# Patient Record
Sex: Female | Born: 1990
Health system: Southern US, Community
[De-identification: ages and names within clinical notes are randomized; demographics above are authoritative.]

## PROBLEM LIST (undated history)

## (undated) DIAGNOSIS — Z820 Family history of epilepsy and other diseases of the nervous system: Secondary | ICD-10-CM

## (undated) DIAGNOSIS — N289 Disorder of kidney and ureter, unspecified: Secondary | ICD-10-CM

## (undated) DIAGNOSIS — R000 Tachycardia, unspecified: Secondary | ICD-10-CM

## (undated) DIAGNOSIS — F419 Anxiety disorder, unspecified: Secondary | ICD-10-CM

## (undated) DIAGNOSIS — G43909 Migraine, unspecified, not intractable, without status migrainosus: Secondary | ICD-10-CM

## (undated) DIAGNOSIS — F32A Depression, unspecified: Secondary | ICD-10-CM

## (undated) DIAGNOSIS — B009 Herpesviral infection, unspecified: Secondary | ICD-10-CM

## (undated) HISTORY — DX: Migraine, unspecified, not intractable, without status migrainosus: G43.909

## (undated) HISTORY — DX: Family history of epilepsy and other diseases of the nervous system: Z82.0

## (undated) HISTORY — PX: TONSILLECTOMY: SUR1361

## (undated) HISTORY — DX: Anxiety disorder, unspecified: F41.9

## (undated) HISTORY — DX: Herpesviral infection, unspecified: B00.9

## (undated) HISTORY — DX: Depression, unspecified: F32.A

---
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
## 2012-10-03 ENCOUNTER — Emergency Department: Payer: Self-pay | Admitting: Emergency Medicine

## 2012-10-03 LAB — PREGNANCY, URINE: Pregnancy Test, Urine: NEGATIVE m[IU]/mL

## 2012-10-03 LAB — COMPREHENSIVE METABOLIC PANEL
Albumin: 4.3 g/dL (ref 3.8–5.6)
Alkaline Phosphatase: 76 U/L — ABNORMAL LOW (ref 82–169)
BUN: 15 mg/dL (ref 7–18)
Calcium, Total: 9.3 mg/dL (ref 9.0–10.7)
EGFR (African American): >60
EGFR (Non-African Amer.): >60
Glucose: 84 mg/dL (ref 65–99)
Potassium: 3.9 mmol/L (ref 3.5–5.1)
SGOT(AST): 13 U/L (ref 0–26)
Sodium: 138 mmol/L (ref 136–145)
Total Protein: 7.8 g/dL (ref 6.4–8.6)

## 2012-10-03 LAB — URINALYSIS, COMPLETE
Bacteria: NONE SEEN
Glucose,UR: NEGATIVE mg/dL (ref 0–75)
Ketone: NEGATIVE
Nitrite: NEGATIVE
Protein: NEGATIVE
RBC,UR: 1 /HPF (ref 0–5)
Specific Gravity: 1.016 (ref 1.003–1.030)
Squamous Epithelial: 1

## 2012-10-03 LAB — CBC
HCT: 43 % (ref 35.0–47.0)
MCH: 31.7 pg (ref 26.0–34.0)
MCV: 92 fL (ref 80–100)

## 2013-07-31 ENCOUNTER — Emergency Department: Payer: Self-pay | Admitting: Emergency Medicine

## 2013-07-31 LAB — COMPREHENSIVE METABOLIC PANEL
ALT: 12 U/L (ref 12–78)
Albumin: 4.3 g/dL (ref 3.4–5.0)
Alkaline Phosphatase: 71 U/L
Anion Gap: 5 — ABNORMAL LOW (ref 7–16)
BUN: 10 mg/dL (ref 7–18)
Bilirubin,Total: 0.5 mg/dL (ref 0.2–1.0)
CHLORIDE: 106 mmol/L (ref 98–107)
CREATININE: 0.69 mg/dL (ref 0.60–1.30)
Calcium, Total: 9.1 mg/dL (ref 8.5–10.1)
Co2: 27 mmol/L (ref 21–32)
EGFR (African American): >60
Glucose: 102 mg/dL — ABNORMAL HIGH (ref 65–99)
Osmolality: 275 (ref 275–301)
POTASSIUM: 3.7 mmol/L (ref 3.5–5.1)
SGOT(AST): 13 U/L — ABNORMAL LOW (ref 15–37)
Sodium: 138 mmol/L (ref 136–145)
TOTAL PROTEIN: 8.1 g/dL (ref 6.4–8.2)

## 2013-07-31 LAB — URINALYSIS, COMPLETE
BLOOD: NEGATIVE
Bacteria: NONE SEEN
Bilirubin,UR: NEGATIVE
Glucose,UR: NEGATIVE mg/dL (ref 0–75)
Ketone: NEGATIVE
LEUKOCYTE ESTERASE: NEGATIVE
Nitrite: NEGATIVE
PH: 5 (ref 4.5–8.0)
PROTEIN: NEGATIVE
Specific Gravity: 1.021 (ref 1.003–1.030)
Squamous Epithelial: 4
WBC UR: 1 /HPF (ref 0–5)

## 2013-07-31 LAB — CBC WITH DIFFERENTIAL/PLATELET
Basophil #: 0.1 10*3/uL (ref 0.0–0.1)
Basophil %: 0.9 %
EOS PCT: 2.1 %
Eosinophil #: 0.1 10*3/uL (ref 0.0–0.7)
HCT: 45.1 % (ref 35.0–47.0)
HGB: 14.8 g/dL (ref 12.0–16.0)
LYMPHS ABS: 1.6 10*3/uL (ref 1.0–3.6)
Lymphocyte %: 24.3 %
MCH: 31.3 pg (ref 26.0–34.0)
MCHC: 32.8 g/dL (ref 32.0–36.0)
MCV: 95 fL (ref 80–100)
Monocyte #: 0.4 x10 3/mm (ref 0.2–0.9)
Monocyte %: 6.9 %
Neutrophil #: 4.2 10*3/uL (ref 1.4–6.5)
Neutrophil %: 65.8 %
Platelet: 201 10*3/uL (ref 150–440)
RBC: 4.74 10*6/uL (ref 3.80–5.20)
RDW: 12.9 % (ref 11.5–14.5)
WBC: 6.4 10*3/uL (ref 3.6–11.0)

## 2017-09-25 ENCOUNTER — Encounter: Payer: Self-pay | Admitting: Internal Medicine

## 2017-09-25 ENCOUNTER — Ambulatory Visit: Payer: Managed Care, Other (non HMO) | Admitting: Internal Medicine

## 2017-09-25 VITALS — BP 98/62 | HR 88 | Temp 98.2°F | Ht 62.0 in | Wt 119.2 lb

## 2017-09-25 DIAGNOSIS — Z1329 Encounter for screening for other suspected endocrine disorder: Secondary | ICD-10-CM | POA: Diagnosis not present

## 2017-09-25 DIAGNOSIS — Z0184 Encounter for antibody response examination: Secondary | ICD-10-CM | POA: Diagnosis not present

## 2017-09-25 DIAGNOSIS — Z1159 Encounter for screening for other viral diseases: Secondary | ICD-10-CM

## 2017-09-25 DIAGNOSIS — G43909 Migraine, unspecified, not intractable, without status migrainosus: Secondary | ICD-10-CM | POA: Diagnosis not present

## 2017-09-25 DIAGNOSIS — Z13220 Encounter for screening for lipoid disorders: Secondary | ICD-10-CM | POA: Diagnosis not present

## 2017-09-25 DIAGNOSIS — Z0000 Encounter for general adult medical examination without abnormal findings: Secondary | ICD-10-CM

## 2017-09-25 DIAGNOSIS — Z8041 Family history of malignant neoplasm of ovary: Secondary | ICD-10-CM | POA: Diagnosis not present

## 2017-09-25 DIAGNOSIS — Z124 Encounter for screening for malignant neoplasm of cervix: Secondary | ICD-10-CM

## 2017-09-25 DIAGNOSIS — Z803 Family history of malignant neoplasm of breast: Secondary | ICD-10-CM | POA: Insufficient documentation

## 2017-09-25 DIAGNOSIS — E559 Vitamin D deficiency, unspecified: Secondary | ICD-10-CM | POA: Diagnosis not present

## 2017-09-25 DIAGNOSIS — Z1389 Encounter for screening for other disorder: Secondary | ICD-10-CM

## 2017-09-25 MED ORDER — ONDANSETRON HCL 4 MG PO TABS: 4.0000 mg | ORAL_TABLET | Freq: Three times a day (TID) | ORAL | 0 refills | Status: DC | PRN

## 2017-09-25 MED ORDER — SUMATRIPTAN SUCCINATE 50 MG PO TABS: ORAL_TABLET | ORAL | 2 refills | Status: DC

## 2017-09-25 NOTE — Progress Notes (Signed)
Pre visit review using our clinic review tool, if applicable. No additional management support is needed unless otherwise documented below in the visit note. 

## 2017-09-25 NOTE — Progress Notes (Signed)
Chief Complaint  Patient presents with  . New Patient (Initial Visit)   New patient  1. Migraines 2 months ago had h/a and left eye vision abnormal went to urgent care and told had migraines. Migraines associated with n/v bothered by light/sound. She has then 1x per month not related to menses also mother has migraines. Tries excedrin otc w/o much relief and sleeping which helps  2. She reports she is sexually active not using protection of OCP long period and still not pregnant will refer to OB/GYN also FH breast and ovarian cancer and needs pap    Review of Systems  Constitutional: Negative for weight loss.  HENT: Negative for hearing loss.   Eyes: Negative for blurred vision.  Respiratory: Negative for shortness of breath.   Cardiovascular: Negative for chest pain.  Gastrointestinal: Negative for abdominal pain.  Musculoskeletal: Negative for falls.  Skin: Negative for rash.  Neurological: Positive for headaches.  Psychiatric/Behavioral: Negative for depression.   Past Medical History:  Diagnosis Date  . FH: migraines    mom  . Migraines    Past Surgical History:  Procedure Laterality Date  . TONSILLECTOMY     Family History  Problem Relation Age of Onset  . Arthritis Mother   . Cancer Mother        ovarian   . Depression Mother   . Cancer Maternal Grandmother        breast   . Depression Maternal Grandmother   . Cancer Maternal Grandfather        ? type ?bone  . Hearing loss Maternal Grandfather    Social History   Socioeconomic History  . Marital status: Single    Spouse name: Not on file  . Number of children: Not on file  . Years of education: Not on file  . Highest education level: Not on file  Occupational History  . Not on file  Social Needs  . Financial resource strain: Not on file  . Food insecurity:    Worry: Not on file    Inability: Not on file  . Transportation needs:    Medical: Not on file    Non-medical: Not on file  Tobacco Use  .  Smoking status: Light Tobacco Smoker  . Smokeless tobacco: Never Used  . Tobacco comment: social smoker   Substance and Sexual Activity  . Alcohol use: Yes  . Drug use: Not Currently  . Sexual activity: Yes    Partners: Male    Birth control/protection: None  Lifestyle  . Physical activity:    Days per week: Not on file    Minutes per session: Not on file  . Stress: Not on file  Relationships  . Social connections:    Talks on phone: Not on file    Gets together: Not on file    Attends religious service: Not on file    Active member of club or organization: Not on file    Attends meetings of clubs or organizations: Not on file    Relationship status: Not on file  . Intimate partner violence:    Fear of current or ex partner: Not on file    Emotionally abused: Not on file    Physically abused: Not on file    Forced sexual activity: Not on file  Other Topics Concern  . Not on file  Social History Narrative   [employer] [place]    HS ed    No kids  Wears seat belt, no guns, safe in relationship    No outpatient medications have been marked as taking for the 09/25/17 encounter (Office Visit) with McLean-Scocuzza, Nino Glow, MD.   No Known Allergies No results found for this or any previous visit (from the past 2160 hour(s)). Objective  Body mass index is 21.8 kg/m. Wt Readings from Last 3 Encounters:  09/25/17 119 lb 3.2 oz (54.1 kg)   Temp Readings from Last 3 Encounters:  09/25/17 98.2 F (36.8 C) (Oral)   BP Readings from Last 3 Encounters:  09/25/17 98/62   Pulse Readings from Last 3 Encounters:  09/25/17 88    Physical Exam  Constitutional: She is oriented to person, place, and time. Vital signs are normal. She appears well-developed and well-nourished. She is cooperative.  HENT:  Head: Normocephalic and atraumatic.  Mouth/Throat: Oropharynx is clear and moist and mucous membranes are normal.  Eyes: Pupils are equal, round,  and reactive to light. Conjunctivae are normal.  Cardiovascular: Normal rate, regular rhythm and normal heart sounds.  Pulmonary/Chest: Effort normal and breath sounds normal.  Neurological: She is alert and oriented to person, place, and time. Gait normal.  Skin: Skin is warm, dry and intact.  Benign nevi  Tanned skin   Psychiatric: She has a normal mood and affect. Her speech is normal and behavior is normal. Judgment and thought content normal. Cognition and memory are normal.  Nursing note and vitals reviewed.   Assessment   1. Migraines  2. FH ovarian, breast cancer, need for pap, ?fertility status  3. HM Plan  1. Trial of imitrex and zofran  Refer to [facility]  Consider neurology if not better in future  Sleeping 8 hours, rec reduce caffeine  2.  Refer [facility] Dr. Georgianne Fick do pap w/u fertility and disc genetic testing for FH  3.  Declines flu shot  Disc and rec Tdap pt to think given info today  Per pt hep B immune  sch fasting labs before next appt  Per pt just had std testing so declines  Pap refer OB/GYN LMP 08/26/17  rec smoking cessation light social smoker  rec avoid sun tanning  Provider: Dr. Olivia Mackie McLean-Scocuzza-Internal Medicine

## 2017-09-25 NOTE — Patient Instructions (Addendum)
Please take imitrex 50 with headache worse headache (you may take with Advile/Aleve) and then repeat another dose in 2 hours. No more than 200 mg per day no more than 2x per week  Think about Tdap vaccine   Tdap/DTaP Vaccine (Diphtheria, Tetanus, and Pertussis): What You Need to Know 1. Why get vaccinated? Diphtheria, tetanus, and pertussis are serious diseases caused by bacteria. Diphtheria and pertussis are spread from person to person. Tetanus enters the body through cuts or wounds. DIPHTHERIA causes a thick covering in the back of the throat.  It can lead to breathing problems, paralysis, heart failure, and even death.  TETANUS (Lockjaw) causes painful tightening of the muscles, usually all over the body.  It can lead to "locking" of the jaw so the victim cannot open his mouth or swallow. Tetanus leads to death in up to 2 out of 10 cases.  PERTUSSIS (Whooping Cough) causes coughing spells so bad that it is hard for infants to eat, drink, or breathe. These spells can last for weeks.  It can lead to pneumonia, seizures (jerking and staring spells), brain damage, and death.  Diphtheria, tetanus, and pertussis vaccine (DTaP) can help prevent these diseases. Most children who are vaccinated with DTaP will be protected throughout childhood. Many more children would get these diseases if we stopped vaccinating. DTaP is a safer version of an older vaccine called DTP. DTP is no longer used in the [place]. 2. Who should get DTaP vaccine and when? Children should get 5 doses of DTaP vaccine, one dose at each of the following ages:  2 months  4 months  6 months  15-18 months  4-6 years  DTaP may be given at the same time as other vaccines. 3. Some children should not get DTaP vaccine or should wait  Children with minor illnesses, such as a cold, may be vaccinated. But children who are moderately or severely ill should usually wait until they recover before getting DTaP  vaccine.  Any child who had a life-threatening allergic reaction after a dose of DTaP should not get another dose.  Any child who suffered a brain or nervous system disease within 7 days after a dose of DTaP should not get another dose.  Talk with your doctor if your child: ? had a seizure or collapsed after a dose of DTaP, ? cried non-stop for 3 hours or more after a dose of DTaP, ? had a fever over 105F after a dose of DTaP. Ask your doctor for more information. Some of these children should not get another dose of pertussis vaccine, but may get a vaccine without pertussis, called DT. 4. Older children and adults DTaP is not licensed for adolescents, adults, or children 26 years of age and older. But older people still need protection. A vaccine called Tdap is similar to DTaP. A single dose of Tdap is recommended for people 11 through 25 years of age. Another vaccine, called Td, protects against tetanus and diphtheria, but not pertussis. It is recommended every 10 years. There are separate Vaccine Information Statements for these vaccines. 5. What are the risks from DTaP vaccine? Getting diphtheria, tetanus, or pertussis disease is much riskier than getting DTaP vaccine. However, a vaccine, like any medicine, is capable of causing serious problems, such as severe allergic reactions. The risk of DTaP vaccine causing serious harm, or death, is extremely small. Mild problems (common)  Fever (up to about 1 child in 4)  Redness or swelling where the shot was  given (up to about 1 child in 4)  Soreness or tenderness where the shot was given (up to about 1 child in 4) These problems occur more often after the 4th and 5th doses of the DTaP series than after earlier doses. Sometimes the 4th or 5th dose of DTaP vaccine is followed by swelling of the entire arm or leg in which the shot was given, lasting 1-7 days (up to about 1 child in 34). Other mild problems include:  Fussiness (up to about 1  child in 3)  Tiredness or poor appetite (up to about 1 child in 10)  Vomiting (up to about 1 child in 57) These problems generally occur 1-3 days after the shot. Moderate problems (uncommon)  Seizure (jerking or staring) (about 1 child out of 14,000)  Non-stop crying, for 3 hours or more (up to about 1 child out of 1,000)  High fever, over 105F (about 1 child out of 16,000) Severe problems (very rare)  Serious allergic reaction (less than 1 out of a million doses)  Several other severe problems have been reported after DTaP vaccine. These include: ? Long-term seizures, coma, or lowered consciousness ? Permanent brain damage. These are so rare it is hard to tell if they are caused by the vaccine. Controlling fever is especially important for children who have had seizures, for any reason. It is also important if another family member has had seizures. You can reduce fever and pain by giving your child an aspirin-free pain reliever when the shot is given, and for the next 24 hours, following the package instructions. 6. What if there is a serious reaction? What should I look for? Look for anything that concerns you, such as signs of a severe allergic reaction, very high fever, or behavior changes. Signs of a severe allergic reaction can include hives, swelling of the face and throat, difficulty breathing, a fast heartbeat, dizziness, and weakness. These would start a few minutes to a few hours after the vaccination. What should I do?  If you think it is a severe allergic reaction or other emergency that can't wait, call 9-1-1 or get the person to the nearest hospital. Otherwise, call your doctor.  Afterward, the reaction should be reported to the Vaccine Adverse Event Reporting System (VAERS). Your doctor might file this report, or you can do it yourself through the VAERS web site at www.vaers.SamedayNews.es, or by calling 7400998014. ? VAERS is only for reporting reactions. They do not  give medical advice. 7. The National Vaccine Injury Compensation Program The [employer] Vaccine Injury Compensation Program (VICP) is a federal program that was created to compensate people who may have been injured by certain vaccines. Persons who believe they may have been injured by a vaccine can learn about the program and about filing a claim by calling (218) 266-2415 or visiting the [place] website at GoldCloset.com.ee. 8. How can I learn more?  Ask your doctor.  Call your local or state health department.  Contact the Centers for Disease Control and Prevention (CDC): ? Call (234)378-9690 (1-800-CDC-INFO) or ? Visit CDC's website at http://hunter.com/ CDC DTaP Vaccine (Diphtheria, Tetanus, and Pertussis) VIS (09/14/05) This information is not intended to replace advice given to you by your health care provider. Make sure you discuss any questions you have with your health care provider. Document Released: 02/12/2006 Document Revised: 01/06/2016 Document Reviewed: 01/06/2016 Elsevier Interactive Patient Education  2017 [place].   Imitrex/Sumatriptan tablets What is this medicine? SUMATRIPTAN (soo ma TRIP tan) is used  to treat migraines with or without aura. An aura is a strange feeling or visual disturbance that warns you of an attack. It is not used to prevent migraines. This medicine may be used for other purposes; ask your health care provider or pharmacist if you have questions. COMMON BRAND NAME(S): Imitrex, Migraine Pack What should I tell my health care provider before I take this medicine? They need to know if you have any of these conditions: -circulation problems in fingers and toes -diabetes -heart disease -high blood pressure -high cholesterol -history of irregular heartbeat -history of stroke -kidney disease -liver disease -postmenopausal or surgical removal of uterus and ovaries -seizures -smoke tobacco -stomach or intestine problems -an  unusual or allergic reaction to sumatriptan, other medicines, foods, dyes, or preservatives -pregnant or trying to get pregnant -breast-feeding How should I use this medicine? Take this medicine by mouth with a glass of water. Follow the directions on the prescription label. This medicine is taken at the first symptoms of a migraine. It is not for everyday use. If your migraine headache returns after one dose, you can take another dose as directed. You must leave at least 2 hours between doses, and do not take more than 100 mg as a single dose. Do not take more than 200 mg total in any 24 hour period. If there is no improvement at all after the first dose, do not take a second dose without talking to your doctor or health care professional. Do not take your medicine more often than directed. Talk to your pediatrician regarding the use of this medicine in children. Special care may be needed. Overdosage: If you think you have taken too much of this medicine contact a poison control center or emergency room at once. NOTE: This medicine is only for you. Do not share this medicine with others. What if I miss a dose? This does not apply; this medicine is not for regular use. What may interact with this medicine? Do not take this medicine with any of the following medicines: -cocaine -ergot alkaloids like dihydroergotamine, ergonovine, ergotamine, methylergonovine -feverfew -MAOIs like Carbex, Eldepryl, Marplan, Nardil, and Parnate -other medicines for migraine headache like almotriptan, eletriptan, frovatriptan, naratriptan, rizatriptan, zolmitriptan -tryptophan This medicine may also interact with the following medications: -certain medicines for depression, anxiety, or psychotic disturbances This list may not describe all possible interactions. Give your health care provider a list of all the medicines, herbs, non-prescription drugs, or dietary supplements you use. Also tell them if you smoke, drink  alcohol, or use illegal drugs. Some items may interact with your medicine. What should I watch for while using this medicine? Only take this medicine for a migraine headache. Take it if you get warning symptoms or at the start of a migraine attack. It is not for regular use to prevent migraine attacks. You may get drowsy or dizzy. Do not drive, use machinery, or do anything that needs mental alertness until you know how this medicine affects you. To reduce dizzy or fainting spells, do not sit or stand up quickly, especially if you are an older patient. Alcohol can increase drowsiness, dizziness and flushing. Avoid alcoholic drinks. Smoking cigarettes may increase the risk of heart-related side effects from using this medicine. If you take migraine medicines for 10 or more days a month, your migraines may get worse. Keep a diary of headache days and medicine use. Contact your healthcare professional if your migraine attacks occur more frequently. What side effects may I notice from  receiving this medicine? Side effects that you should report to your doctor or health care professional as soon as possible: -allergic reactions like skin rash, itching or hives, swelling of the face, lips, or tongue -bloody or watery diarrhea -hallucination, loss of contact with reality -pain, tingling, numbness in the face, hands, or feet -seizures -signs and symptoms of a blood clot such as breathing problems; changes in vision; chest pain; severe, sudden headache; pain, swelling, warmth in the leg; trouble speaking; sudden numbness or weakness of the face, arm, or leg -signs and symptoms of a dangerous change in heartbeat or heart rhythm like chest pain; dizziness; fast or irregular heartbeat; palpitations, feeling faint or lightheaded; falls; breathing problems -signs and symptoms of a stroke like changes in vision; confusion; trouble speaking or understanding; severe headaches; sudden numbness or weakness of the face,  arm, or leg; trouble walking; dizziness; loss of balance or coordination -stomach pain Side effects that usually do not require medical attention (report to your doctor or health care professional if they continue or are bothersome): -changes in taste -facial flushing -headache -muscle cramps -muscle pain -nausea, vomiting -weak or tired This list may not describe all possible side effects. Call your doctor for medical advice about side effects. You may report side effects to FDA at 1-800-FDA-1088. Where should I keep my medicine? Keep out of the reach of children. Store at room temperature between 2 and 30 degrees C (36 and 86 degrees F). Throw away any unused medicine after the expiration date. NOTE: This sheet is a summary. It may not cover all possible information. If you have questions about this medicine, talk to your doctor, pharmacist, or health care provider.  2018 Elsevier/Gold Standard (2015-05-20 12:38:23)  Migraine Headache A migraine headache is an intense, throbbing pain on one side or both sides of the head. Migraines may also cause other symptoms, such as nausea, vomiting, and sensitivity to light and noise. What are the causes? Doing or taking certain things may also trigger migraines, such as:  Alcohol.  Smoking.  Medicines, such as: ? Medicine used to treat chest pain (nitroglycerine). ? Birth control pills. ? Estrogen pills. ? Certain blood pressure medicines.  Aged cheeses, chocolate, or caffeine.  Foods or drinks that contain nitrates, glutamate, aspartame, or tyramine.  Physical activity.  Other things that may trigger a migraine include:  Menstruation.  Pregnancy.  Hunger.  Stress, lack of sleep, too much sleep, or fatigue.  Weather changes.  What increases the risk? The following factors may make you more likely to experience migraine headaches:  Age. Risk increases with age.  Family history of migraine headaches.  Being  Caucasian.  Depression and anxiety.  Obesity.  Being a woman.  Having a hole in the heart (patent foramen ovale) or other heart problems.  What are the signs or symptoms? The main symptom of this condition is pulsating or throbbing pain. Pain may:  Happen in any area of the head, such as on one side or both sides.  Interfere with daily activities.  Get worse with physical activity.  Get worse with exposure to bright lights or loud noises.  Other symptoms may include:  Nausea.  Vomiting.  Dizziness.  General sensitivity to bright lights, loud noises, or smells.  Before you get a migraine, you may get warning signs that a migraine is developing (aura). An aura may include:  Seeing flashing lights or having blind spots.  Seeing bright spots, halos, or zigzag lines.  Having tunnel vision or  blurred vision.  Having numbness or a tingling feeling.  Having trouble talking.  Having muscle weakness.  How is this diagnosed? A migraine headache can be diagnosed based on:  Your symptoms.  A physical exam.  Tests, such as CT scan or MRI of the head. These imaging tests can help rule out other causes of headaches.  Taking fluid from the spine (lumbar puncture) and analyzing it (cerebrospinal fluid analysis, or CSF analysis).  How is this treated? A migraine headache is usually treated with medicines that:  Relieve pain.  Relieve nausea.  Prevent migraines from coming back.  Treatment may also include:  Acupuncture.  Lifestyle changes like avoiding foods that trigger migraines.  Follow these instructions at home: Medicines  Take over-the-counter and prescription medicines only as told by your health care provider.  Do not drive or use heavy machinery while taking prescription pain medicine.  To prevent or treat constipation while you are taking prescription pain medicine, your health care provider may recommend that you: ? Drink enough fluid to keep  your urine clear or pale yellow. ? Take over-the-counter or prescription medicines. ? Eat foods that are high in fiber, such as fresh fruits and vegetables, whole grains, and beans. ? Limit foods that are high in fat and processed sugars, such as fried and sweet foods. Lifestyle  Avoid alcohol use.  Do not use any products that contain nicotine or tobacco, such as cigarettes and e-cigarettes. If you need help quitting, ask your health care provider.  Get at least 8 hours of sleep every night.  Limit your stress. General instructions   Keep a journal to find out what may trigger your migraine headaches. For example, write down: ? What you eat and drink. ? How much sleep you get. ? Any change to your diet or medicines.  If you have a migraine: ? Avoid things that make your symptoms worse, such as bright lights. ? It may help to lie down in a dark, quiet room. ? Do not drive or use heavy machinery. ? Ask your health care provider what activities are safe for you while you are experiencing symptoms.  Keep all follow-up visits as told by your health care provider. This is important. Contact a health care provider if:  You develop symptoms that are different or more severe than your usual migraine symptoms. Get help right away if:  Your migraine becomes severe.  You have a fever.  You have a stiff neck.  You have vision loss.  Your muscles feel weak or like you cannot control them.  You start to lose your balance often.  You develop trouble walking.  You faint. This information is not intended to replace advice given to you by your health care provider. Make sure you discuss any questions you have with your health care provider. Document Released: 04/17/2005 Document Revised: 11/05/2015 Document Reviewed: 10/04/2015 Elsevier Interactive Patient Education  2017 [employer].

## 2017-10-01 ENCOUNTER — Encounter: Payer: Self-pay | Admitting: Obstetrics and Gynecology

## 2017-10-12 ENCOUNTER — Encounter: Payer: Self-pay | Admitting: Obstetrics and Gynecology

## 2017-10-17 ENCOUNTER — Telehealth: Payer: Self-pay | Admitting: Obstetrics & Gynecology

## 2017-10-17 NOTE — Telephone Encounter (Signed)
[facility] referring for pap. Patient has been schedule twice 10/01/17 reschedule appointment to 10/12/17 then No showed appointment. Called and left voicemail for patient to call back to be schedule.

## 2017-10-23 NOTE — Telephone Encounter (Signed)
Called and left voice mail for patient to call back to be schedule °

## 2017-10-25 NOTE — Telephone Encounter (Signed)
Patient has been schedule twice 10/01/17 reschedule appointment to 10/12/17 then No showed appointment. Called and left voicemail for patient to call back to be schedule.. Letter sent to pcp office due to unable to reach patient

## 2017-10-31 ENCOUNTER — Other Ambulatory Visit (INDEPENDENT_AMBULATORY_CARE_PROVIDER_SITE_OTHER): Payer: Managed Care, Other (non HMO)

## 2017-10-31 DIAGNOSIS — Z1329 Encounter for screening for other suspected endocrine disorder: Secondary | ICD-10-CM

## 2017-10-31 DIAGNOSIS — Z13220 Encounter for screening for lipoid disorders: Secondary | ICD-10-CM

## 2017-10-31 DIAGNOSIS — E559 Vitamin D deficiency, unspecified: Secondary | ICD-10-CM

## 2017-10-31 DIAGNOSIS — Z0000 Encounter for general adult medical examination without abnormal findings: Secondary | ICD-10-CM

## 2017-10-31 DIAGNOSIS — Z0184 Encounter for antibody response examination: Secondary | ICD-10-CM

## 2017-10-31 DIAGNOSIS — Z1159 Encounter for screening for other viral diseases: Secondary | ICD-10-CM

## 2017-10-31 DIAGNOSIS — Z1389 Encounter for screening for other disorder: Secondary | ICD-10-CM

## 2017-10-31 NOTE — Addendum Note (Signed)
Addended by: Arby Barrette on: 10/31/2017 08:31 AM   Modules accepted: Orders

## 2017-11-01 LAB — URINALYSIS, ROUTINE W REFLEX MICROSCOPIC
BILIRUBIN UA: NEGATIVE
Glucose, UA: NEGATIVE
KETONES UA: NEGATIVE
Nitrite, UA: NEGATIVE
PH UA: 5 (ref 5.0–7.5)
Protein, UA: NEGATIVE
RBC UA: NEGATIVE
SPEC GRAV UA: 1.021 (ref 1.005–1.030)
Urobilinogen, Ur: 0.2 mg/dL (ref 0.2–1.0)

## 2017-11-01 LAB — MICROSCOPIC EXAMINATION: CASTS: NONE SEEN /LPF

## 2017-11-02 LAB — COMPREHENSIVE METABOLIC PANEL
AG Ratio: 1.5 (calc) (ref 1.0–2.5)
ALBUMIN MSPROF: 4.5 g/dL (ref 3.6–5.1)
ALT: 9 U/L (ref 6–29)
AST: 14 U/L (ref 10–30)
Alkaline phosphatase (APISO): 64 U/L (ref 33–115)
BUN: 16 mg/dL (ref 7–25)
CHLORIDE: 107 mmol/L (ref 98–110)
CO2: 20 mmol/L (ref 20–32)
CREATININE: 0.67 mg/dL (ref 0.50–1.10)
Calcium: 9.6 mg/dL (ref 8.6–10.2)
GLOBULIN: 3.1 g/dL (ref 1.9–3.7)
GLUCOSE: 98 mg/dL (ref 65–99)
POTASSIUM: 4.6 mmol/L (ref 3.5–5.3)
Sodium: 141 mmol/L (ref 135–146)
TOTAL PROTEIN: 7.6 g/dL (ref 6.1–8.1)
Total Bilirubin: 0.3 mg/dL (ref 0.2–1.2)

## 2017-11-02 LAB — LIPID PANEL
CHOL/HDL RATIO: 2.1 (calc) (ref <5.0)
Cholesterol: 180 mg/dL (ref <200)
HDL: 84 mg/dL (ref >50)
LDL Cholesterol (Calc): 81 mg/dL (calc)
NON-HDL CHOLESTEROL (CALC): 96 mg/dL (ref <130)
Triglycerides: 74 mg/dL (ref <150)

## 2017-11-02 LAB — CBC WITH DIFFERENTIAL/PLATELET
BASOS PCT: 1.5 %
Basophils Absolute: 78 cells/uL (ref 0–200)
EOS PCT: 6.7 %
Eosinophils Absolute: 348 cells/uL (ref 15–500)
HEMATOCRIT: 43.3 % (ref 35.0–45.0)
HEMOGLOBIN: 14.7 g/dL (ref 11.7–15.5)
LYMPHS ABS: 1628 {cells}/uL (ref 850–3900)
MCH: 31.7 pg (ref 27.0–33.0)
MCHC: 33.9 g/dL (ref 32.0–36.0)
MCV: 93.5 fL (ref 80.0–100.0)
MPV: 10.2 fL (ref 7.5–12.5)
Monocytes Relative: 9.4 %
Neutro Abs: 2657 cells/uL (ref 1500–7800)
Neutrophils Relative %: 51.1 %
Platelets: 242 10*3/uL (ref 140–400)
RBC: 4.63 10*6/uL (ref 3.80–5.10)
RDW: 12.3 % (ref 11.0–15.0)
Total Lymphocyte: 31.3 %
WBC: 5.2 10*3/uL (ref 3.8–10.8)
WBCMIX: 489 {cells}/uL (ref 200–950)

## 2017-11-02 LAB — TSH: TSH: 2.04 mIU/L

## 2017-11-02 LAB — VITAMIN D 25 HYDROXY (VIT D DEFICIENCY, FRACTURES): Vit D, 25-Hydroxy: 31 ng/mL (ref 30–100)

## 2017-11-02 LAB — T4, FREE: FREE T4: 1.3 ng/dL (ref 0.8–1.8)

## 2017-11-02 LAB — MEASLES/MUMPS/RUBELLA IMMUNITY
MUMPS IGG: 71.8 [AU]/ml
RUBELLA: 1.22 {index}
RUBEOLA IGG: 43.6 [AU]/ml

## 2017-11-06 ENCOUNTER — Ambulatory Visit: Payer: Managed Care, Other (non HMO) | Admitting: Internal Medicine

## 2017-11-06 ENCOUNTER — Encounter: Payer: Self-pay | Admitting: Internal Medicine

## 2017-11-06 VITALS — BP 110/60 | HR 84 | Temp 98.1°F | Ht 62.0 in | Wt 124.4 lb

## 2017-11-06 DIAGNOSIS — G43909 Migraine, unspecified, not intractable, without status migrainosus: Secondary | ICD-10-CM

## 2017-11-06 DIAGNOSIS — Z803 Family history of malignant neoplasm of breast: Secondary | ICD-10-CM | POA: Diagnosis not present

## 2017-11-06 DIAGNOSIS — Z23 Encounter for immunization: Secondary | ICD-10-CM | POA: Diagnosis not present

## 2017-11-06 DIAGNOSIS — Z8041 Family history of malignant neoplasm of ovary: Secondary | ICD-10-CM

## 2017-11-06 NOTE — Progress Notes (Signed)
Pre visit review using our clinic review tool, if applicable. No additional management support is needed unless otherwise documented below in the visit note. 

## 2017-11-06 NOTE — Progress Notes (Signed)
   Subjective:    Patient ID: Elizabeth Berger, female    DOB: 26-Jan-1991, 27 y.o.   MRN: [id number]  F/u  1. Migraines improved and responding to imitrex had to use 2x and zofran 1 time only took 1 pill did not make [facility] appt hold for now h/as better   2. Still needs to sch appt ob/gyn      Review of Systems  Constitutional: Negative for unexpected weight change.  HENT: Negative for congestion.   Eyes: Negative for discharge.  Respiratory: Negative for shortness of breath.   Neurological: Negative for headaches.       Objective:   Physical Exam  Constitutional: She is oriented to person, place, and time. Vital signs are normal. She appears well-developed and well-nourished. She is cooperative.  HENT:  Head: Normocephalic and atraumatic.  Mouth/Throat: Oropharynx is clear and moist and mucous membranes are normal.  Eyes: Pupils are equal, round, and reactive to light. Conjunctivae are normal.  Cardiovascular: Normal rate, regular rhythm and normal heart sounds.  Pulmonary/Chest: Effort normal and breath sounds normal.  Neurological: She is alert and oriented to person, place, and time. Gait normal.  Skin: Skin is warm, dry and intact.  Psychiatric: She has a normal mood and affect. Her speech is normal and behavior is normal. Judgment and thought content normal. Cognition and memory are normal.  Nursing note and vitals reviewed.         Assessment & Plan:  1. Migraines improved  2. FH female cancer and pre-conception counseling and needs pap 3. HM  1. Cont meds prn  F/u in 6-12 months  Hold  eye for now  2. Pt needs to call and sch ob/gyn [facility] f/u ask about genetic testing  3.  Declines flu shot  Tdap today  Per pt hep B immune  Per pt just had std testing so declines  Pap refer OB/GYN LMP 08/26/17  -pt needs to resch appt  rec smoking cessation light social smoker  rec avoid sun tanning

## 2017-11-06 NOTE — Patient Instructions (Addendum)
Please call [facility] and ask to reschedule your appt and ask for Dr. Georgianne Fick also ask for about My Risk, My Myriad and BRCA testing with family history  Also tell them you need pap smear  Take care  Good luck today    DTaP Vaccine (Diphtheria, Tetanus, and Pertussis): What You Need to Know 1. Why get vaccinated? Diphtheria, tetanus, and pertussis are serious diseases caused by bacteria. Diphtheria and pertussis are spread from person to person. Tetanus enters the body through cuts or wounds. DIPHTHERIA causes a thick covering in the back of the throat.  It can lead to breathing problems, paralysis, heart failure, and even death.  TETANUS (Lockjaw) causes painful tightening of the muscles, usually all over the body.  It can lead to "locking" of the jaw so the victim cannot open his mouth or swallow. Tetanus leads to death in up to 2 out of 10 cases.  PERTUSSIS (Whooping Cough) causes coughing spells so bad that it is hard for infants to eat, drink, or breathe. These spells can last for weeks.  It can lead to pneumonia, seizures (jerking and staring spells), brain damage, and death.  Diphtheria, tetanus, and pertussis vaccine (DTaP) can help prevent these diseases. Most children who are vaccinated with DTaP will be protected throughout childhood. Many more children would get these diseases if we stopped vaccinating. DTaP is a safer version of an older vaccine called DTP. DTP is no longer used in the [place]. 2. Who should get DTaP vaccine and when? Children should get 5 doses of DTaP vaccine, one dose at each of the following ages:  2 months  4 months  6 months  15-18 months  4-6 years  DTaP may be given at the same time as other vaccines. 3. Some children should not get DTaP vaccine or should wait  Children with minor illnesses, such as a cold, may be vaccinated. But children who are moderately or severely ill should usually wait until they recover before getting  DTaP vaccine.  Any child who had a life-threatening allergic reaction after a dose of DTaP should not get another dose.  Any child who suffered a brain or nervous system disease within 7 days after a dose of DTaP should not get another dose.  Talk with your doctor if your child: ? had a seizure or collapsed after a dose of DTaP, ? cried non-stop for 3 hours or more after a dose of DTaP, ? had a fever over 105F after a dose of DTaP. Ask your doctor for more information. Some of these children should not get another dose of pertussis vaccine, but may get a vaccine without pertussis, called DT. 4. Older children and adults DTaP is not licensed for adolescents, adults, or children 19 years of age and older. But older people still need protection. A vaccine called Tdap is similar to DTaP. A single dose of Tdap is recommended for people 11 through 25 years of age. Another vaccine, called Td, protects against tetanus and diphtheria, but not pertussis. It is recommended every 10 years. There are separate Vaccine Information Statements for these vaccines. 5. What are the risks from DTaP vaccine? Getting diphtheria, tetanus, or pertussis disease is much riskier than getting DTaP vaccine. However, a vaccine, like any medicine, is capable of causing serious problems, such as severe allergic reactions. The risk of DTaP vaccine causing serious harm, or death, is extremely small. Mild problems (common)  Fever (up to about 1 child in 4)  Redness  or swelling where the shot was given (up to about 1 child in 4)  Soreness or tenderness where the shot was given (up to about 1 child in 4) These problems occur more often after the 4th and 5th doses of the DTaP series than after earlier doses. Sometimes the 4th or 5th dose of DTaP vaccine is followed by swelling of the entire arm or leg in which the shot was given, lasting 1-7 days (up to about 1 child in 61). Other mild problems include:  Fussiness (up to about  1 child in 3)  Tiredness or poor appetite (up to about 1 child in 10)  Vomiting (up to about 1 child in 107) These problems generally occur 1-3 days after the shot. Moderate problems (uncommon)  Seizure (jerking or staring) (about 1 child out of 14,000)  Non-stop crying, for 3 hours or more (up to about 1 child out of 1,000)  High fever, over 105F (about 1 child out of 16,000) Severe problems (very rare)  Serious allergic reaction (less than 1 out of a million doses)  Several other severe problems have been reported after DTaP vaccine. These include: ? Long-term seizures, coma, or lowered consciousness ? Permanent brain damage. These are so rare it is hard to tell if they are caused by the vaccine. Controlling fever is especially important for children who have had seizures, for any reason. It is also important if another family member has had seizures. You can reduce fever and pain by giving your child an aspirin-free pain reliever when the shot is given, and for the next 24 hours, following the package instructions. 6. What if there is a serious reaction? What should I look for? Look for anything that concerns you, such as signs of a severe allergic reaction, very high fever, or behavior changes. Signs of a severe allergic reaction can include hives, swelling of the face and throat, difficulty breathing, a fast heartbeat, dizziness, and weakness. These would start a few minutes to a few hours after the vaccination. What should I do?  If you think it is a severe allergic reaction or other emergency that can't wait, call 9-1-1 or get the person to the nearest hospital. Otherwise, call your doctor.  Afterward, the reaction should be reported to the Vaccine Adverse Event Reporting System (VAERS). Your doctor might file this report, or you can do it yourself through the VAERS web site at www.vaers.SamedayNews.es, or by calling 212 264 9362. ? VAERS is only for reporting reactions. They do not  give medical advice. 7. The National Vaccine Injury Compensation Program The [employer] Vaccine Injury Compensation Program (VICP) is a federal program that was created to compensate people who may have been injured by certain vaccines. Persons who believe they may have been injured by a vaccine can learn about the program and about filing a claim by calling 712-020-3834 or visiting the [employer] website at GoldCloset.com.ee. 8. How can I learn more?  Ask your doctor.  Call your local or state health department.  Contact the Centers for Disease Control and Prevention (CDC): ? Call (707) 060-0592 (1-800-CDC-INFO) or ? Visit CDC's website at http://hunter.com/ CDC DTaP Vaccine (Diphtheria, Tetanus, and Pertussis) VIS (09/14/05) This information is not intended to replace advice given to you by your health care provider. Make sure you discuss any questions you have with your health care provider. Document Released: 02/12/2006 Document Revised: 01/06/2016 Document Reviewed: 01/06/2016 Elsevier Interactive Patient Education  2017 [employer].

## 2018-01-02 ENCOUNTER — Encounter: Payer: Self-pay | Admitting: Internal Medicine

## 2018-01-03 ENCOUNTER — Encounter: Payer: Self-pay | Admitting: Family Medicine

## 2018-01-03 ENCOUNTER — Ambulatory Visit (INDEPENDENT_AMBULATORY_CARE_PROVIDER_SITE_OTHER): Payer: Self-pay | Admitting: Family Medicine

## 2018-01-03 VITALS — BP 118/80 | HR 58 | Temp 98.5°F | Resp 15 | Ht 62.0 in | Wt 134.6 lb

## 2018-01-03 DIAGNOSIS — R0981 Nasal congestion: Secondary | ICD-10-CM

## 2018-01-03 DIAGNOSIS — R5383 Other fatigue: Secondary | ICD-10-CM

## 2018-01-03 DIAGNOSIS — R05 Cough: Secondary | ICD-10-CM

## 2018-01-03 DIAGNOSIS — R110 Nausea: Secondary | ICD-10-CM

## 2018-01-03 DIAGNOSIS — R058 Other specified cough: Secondary | ICD-10-CM

## 2018-01-03 LAB — POCT URINE PREGNANCY: Preg Test, Ur: NEGATIVE

## 2018-01-03 LAB — POCT URINALYSIS DIPSTICK
BILIRUBIN UA: NEGATIVE
GLUCOSE UA: NEGATIVE
KETONES UA: NEGATIVE
Leukocytes, UA: NEGATIVE
Nitrite, UA: NEGATIVE
Protein, UA: NEGATIVE
Spec Grav, UA: 1.02 (ref 1.010–1.025)
Urobilinogen, UA: 0.2 E.U./dL
pH, UA: 7 (ref 5.0–8.0)

## 2018-01-03 MED ORDER — LORATADINE 10 MG PO TABS
10.0000 mg | ORAL_TABLET | Freq: Every day | ORAL | 1 refills | Status: DC
Start: 2018-01-03 — End: 2018-02-27

## 2018-01-03 NOTE — Progress Notes (Signed)
Subjective:    Patient ID: Elizabeth Berger, female    DOB: 05/02/1990, 27 y.o.   MRN: [id number]  HPI  Patient presents to clinic complaining of sinus pressure, lots of nasal drainage, dry cough.  Also reports over the past week or so she has had nausea feeling very fatigued, wanting to nap all the time and weight gain.  Lab work done recently in July 2019.  Labs and included CBC, CMP, thyroid, vitamin D and all were normal.  Patient's last menstrual cycle ended August 26.  Patient does not believe she could be pregnant, but states could be a possibility.   Patient Active Problem List   Diagnosis Date Noted  . Migraine without status migrainosus, not intractable 09/25/2017  . Family history of breast cancer 09/25/2017  . Family history of ovarian cancer 09/25/2017   Social History   Tobacco Use  . Smoking status: Light Tobacco Smoker  . Smokeless tobacco: Never Used  . Tobacco comment: social smoker   Substance Use Topics  . Alcohol use: Yes   Review of Systems   Constitutional: Negative for chills, and fever. +fatigue HENT: positive for congestion, ear pain, sinus pain & runny nose   Eyes: Negative.   Respiratory: +dry cough, tickle in throat. Negative for shortness of breath and wheezing.   Cardiovascular: Negative for chest pain, palpitations and leg swelling.  Gastrointestinal: Negative for abdominal pain, diarrhea, and vomiting. +nausea Genitourinary: Negative for dysuria, frequency and urgency.  Musculoskeletal: Negative for arthralgias and myalgias.  Skin: Negative for color change, pallor and rash.  Neurological: Negative for syncope, light-headedness and headaches.  Psychiatric/Behavioral: The patient is not nervous/anxious.    Objective:   Physical Exam   Constitutional: She is oriented to person, place, and time. She appears well-developed and well-nourished. No distress.  HENT:  Nose/throat: nares inflamed, +rhinorrhea, +severe post nasal drip. Head:  Normocephalic and atraumatic.  Eyes: Pupils are equal, round, and reactive to light. EOM are normal. No scleral icterus.  Neck: Normal range of motion. Neck supple. No tracheal deviation present.  Cardiovascular: Normal rate, regular rhythm and normal heart sounds.  Pulmonary/Chest: Effort normal and breath sounds normal. No respiratory distress. She has no wheezes. She has no rales.  Neurological: She is alert and oriented to person, place, and time.  Gait normal  Skin: Skin is warm and dry. No pallor.  Psychiatric: She has a normal mood and affect. Her behavior is normal. Thought content normal.   Nursing note and vitals reviewed.  Vitals:   01/03/18 1629  BP: 118/80  Pulse: (!) 58  Resp: 15  Temp: 98.5 F (36.9 C)  SpO2: 99%      Assessment & Plan:   Fatigue/nausea - unknown cause.  Lab work done in July was unremarkable and did not reveal any low vitamin levels, anemia, thyroid issues.  Fatigue possibly could be related to severe sinus congestion/seasonal allergies.  Urinalysis was negative.  Urine pregnancy was also negative.  I will draw blood hCG level due to menses being so recent, pregnancy still possibility but if hCG level is not high enough could possibly not show on a urine pregnancy test.   Nasal congestion/postnasal drip/seasonal allergies- patient currently not on any oral antihistamine.  She will begin Claritin 10 mg daily.  Claritin is a great medication to help dry up nasal congestion and improve postnasal drip symptoms.  Keep regular follow-up as planned in January 2020.  Return to clinic sooner if issues arise or if symptoms  do not improve with addition of Claritin.

## 2018-01-04 ENCOUNTER — Encounter: Payer: Self-pay | Admitting: Family Medicine

## 2018-01-04 LAB — HCG, QUANTITATIVE, PREGNANCY: QUANTITATIVE HCG: 0.72 m[IU]/mL

## 2018-01-04 NOTE — Addendum Note (Signed)
Addended by: Philis Nettle on: 01/04/2018 12:23 PM   Modules accepted: Orders

## 2018-01-09 ENCOUNTER — Other Ambulatory Visit (INDEPENDENT_AMBULATORY_CARE_PROVIDER_SITE_OTHER): Payer: Self-pay

## 2018-01-09 DIAGNOSIS — R110 Nausea: Secondary | ICD-10-CM

## 2018-01-09 DIAGNOSIS — R5383 Other fatigue: Secondary | ICD-10-CM

## 2018-01-09 LAB — HCG, QUANTITATIVE, PREGNANCY: Quantitative HCG: <0.6 m[IU]/mL

## 2018-02-01 ENCOUNTER — Encounter: Payer: Self-pay | Admitting: Internal Medicine

## 2018-02-27 ENCOUNTER — Encounter: Payer: Self-pay | Admitting: Certified Nurse Midwife

## 2018-02-27 ENCOUNTER — Ambulatory Visit (INDEPENDENT_AMBULATORY_CARE_PROVIDER_SITE_OTHER): Payer: No Typology Code available for payment source

## 2018-02-27 ENCOUNTER — Ambulatory Visit (INDEPENDENT_AMBULATORY_CARE_PROVIDER_SITE_OTHER): Payer: No Typology Code available for payment source | Admitting: Certified Nurse Midwife

## 2018-02-27 ENCOUNTER — Other Ambulatory Visit (HOSPITAL_COMMUNITY)
Admission: RE | Admit: 2018-02-27 | Discharge: 2018-02-27 | Disposition: A | Discharge status: Home / Self Care | Payer: No Typology Code available for payment source | Source: Ambulatory Visit | Attending: Obstetrics and Gynecology | Admitting: Obstetrics and Gynecology

## 2018-02-27 VITALS — BP 109/77 | HR 102 | Ht 62.0 in | Wt 139.2 lb

## 2018-02-27 DIAGNOSIS — Z319 Encounter for procreative management, unspecified: Secondary | ICD-10-CM

## 2018-02-27 DIAGNOSIS — Z01419 Encounter for gynecological examination (general) (routine) without abnormal findings: Secondary | ICD-10-CM

## 2018-02-27 DIAGNOSIS — Z124 Encounter for screening for malignant neoplasm of cervix: Secondary | ICD-10-CM

## 2018-02-27 DIAGNOSIS — N979 Female infertility, unspecified: Secondary | ICD-10-CM

## 2018-02-27 NOTE — Progress Notes (Signed)
GYNECOLOGY ANNUAL PREVENTATIVE CARE ENCOUNTER NOTE  Subjective:   Elizabeth Berger is a 27 y.o. G0P0000 female here for a routine annual gynecologic exam.  Current complaints: infertility. Pt state she has been having unprotected intercourse for several yrs but has actively been trying x 1 yr. She has done ovulation kit x 1. She has regular periods and has breast tenderness with her cycle.  Denies abnormal vaginal bleeding, discharge, pelvic pain, problems with intercourse or other gynecologic concerns. She has one female partner and denies possibility of STD. She also admits to recent rapid weight gain of 30lbs this past year. She denies any changes in diet or exercise.    Gynecologic History Patient's last menstrual period was 02/19/2018. Contraception: none Last Pap: been a while negative per pt. Last mammogram: n/A.   Obstetric History OB History  Gravida Para Term Preterm AB Living  0 0 0 0 0 0  SAB TAB Ectopic Multiple Live Births  0 0 0 0 0    Past Medical History:  Diagnosis Date  . FH: migraines    mom  . Migraines     Past Surgical History:  Procedure Laterality Date  . TONSILLECTOMY      Current Outpatient Medications on File Prior to Visit  Medication Sig Dispense Refill  . ondansetron (ZOFRAN) 4 MG tablet Take 1 tablet (4 mg total) by mouth every 8 (eight) hours as needed for nausea or vomiting. 30 tablet 0  . SUMAtriptan (IMITREX) 50 MG tablet For migraine. May repeat in 2 hours if headache persists or recurs. No more than 2x per week or 200 mg total in 1 day 10 tablet 2   No current facility-administered medications on file prior to visit.    No exercise Diet: healty, eats fast food 5 x month Smokes occasionally /socially  Alcohol; every weekend No drugs  No Known Allergies  Social History:  reports that she has quit smoking. She has never used smokeless tobacco. She reports that she drinks alcohol. She reports that she has current or past drug  history.  Family History  Problem Relation Age of Onset  . Arthritis Mother   . Cancer Mother        ovarian   . Depression Mother   . Cancer Maternal Grandmother        breast   . Depression Maternal Grandmother   . Cancer Maternal Grandfather        ? type ?bone  . Hearing loss Maternal Grandfather     The following portions of the patient's history were reviewed and updated as appropriate: allergies, current medications, past family history, past medical history, past social history, past surgical history and problem list.  Review of Systems Pertinent items noted in HPI and remainder of comprehensive ROS otherwise negative.   Objective:  BP 109/77   Pulse (!) 102   Ht '5\' 2"'  (1.575 m)   Wt 139 lb 3 oz (63.1 kg)   LMP 02/19/2018   BMI 25.46 kg/m  CONSTITUTIONAL: Well-developed, well-nourished, over weight female in no acute distress.  HENT:  Normocephalic, atraumatic, External right and left ear normal. Oropharynx is clear and moist EYES: Conjunctivae and EOM are normal. Pupils are equal, round, and reactive to light. No scleral icterus.  NECK: Normal range of motion, supple, no masses.  Normal thyroid.  SKIN: Skin is warm and dry. No rash noted. Not diaphoretic. No erythema. No pallor. MUSCULOSKELETAL: Normal range of motion. No tenderness.  No cyanosis, clubbing, or edema.  2+ distal pulses. NEUROLOGIC: Alert and oriented to person, place, and time. Normal reflexes, muscle tone coordination. No cranial nerve deficit noted. PSYCHIATRIC: Normal mood and affect. Normal behavior. Normal judgment and thought content. CARDIOVASCULAR: Normal heart rate noted, regular rhythm RESPIRATORY: Clear to auscultation bilaterally. Effort and breath sounds normal, no problems with respiration noted. BREASTS: Symmetric in size. No masses, skin changes, nipple drainage, or lymphadenopathy. ABDOMEN: Soft, normal bowel sounds, no distention noted.  No tenderness, rebound or guarding.  PELVIC:  Normal appearing external genitalia; normal appearing vaginal mucosa and cervix.  No abnormal discharge noted.  Pap smear obtained. Contact bleeding present.  Normal uterine size, no other palpable masses, no uterine or adnexal tenderness.   Assessment and Plan:  Annual Well Women Exam Infertility  Will follow up results of pap smear and manage accordingly. Mammogram n/a Labs: TSH, prolactin, testosterone, U/S-pelvic, FSH/LH, estradiol( day 2, 3, or 4 of cycle).  Information given for partner to have semen analysis.  Pamphlet on genetic testing for [facility] gene given.  Routine preventative health maintenance measures emphasized. Please refer to After Visit Summary for other counseling recommendations.    Philip Aspen, CNM

## 2018-02-27 NOTE — Patient Instructions (Signed)
Preventive Care 18-39 Years, Female Preventive care refers to lifestyle choices and visits with your health care provider that can promote health and wellness. What does preventive care include?  A yearly physical exam. This is also called an annual well check.  Dental exams once or twice a year.  Routine eye exams. Ask your health care provider how often you should have your eyes checked.  Personal lifestyle choices, including: ? Daily care of your teeth and gums. ? Regular physical activity. ? Eating a healthy diet. ? Avoiding tobacco and drug use. ? Limiting alcohol use. ? Practicing safe sex. ? Taking vitamin and mineral supplements as recommended by your health care provider. What happens during an annual well check? The services and screenings done by your health care provider during your annual well check will depend on your age, overall health, lifestyle risk factors, and family history of disease. Counseling Your health care provider may ask you questions about your:  Alcohol use.  Tobacco use.  Drug use.  Emotional well-being.  Home and relationship well-being.  Sexual activity.  Eating habits.  Work and work Statistician.  Method of birth control.  Menstrual cycle.  Pregnancy history.  Screening You may have the following tests or measurements:  Height, weight, and BMI.  Diabetes screening. This is done by checking your blood sugar (glucose) after you have not eaten for a while (fasting).  Blood pressure.  Lipid and cholesterol levels. These may be checked every 5 years starting at age 66.  Skin check.  Hepatitis C blood test.  Hepatitis B blood test.  Sexually transmitted disease (STD) testing.  BRCA-related cancer screening. This may be done if you have a family history of breast, ovarian, tubal, or peritoneal cancers.  Pelvic exam and Pap test. This may be done every 3 years starting at age 40. Starting at age 59, this may be done every 5  years if you have a Pap test in combination with an HPV test.  Discuss your test results, treatment options, and if necessary, the need for more tests with your health care provider. Vaccines Your health care provider may recommend certain vaccines, such as:  Influenza vaccine. This is recommended every year.  Tetanus, diphtheria, and acellular pertussis (Tdap, Td) vaccine. You may need a Td booster every 10 years.  Varicella vaccine. You may need this if you have not been vaccinated.  HPV vaccine. If you are 69 or younger, you may need three doses over 6 months.  Measles, mumps, and rubella (MMR) vaccine. You may need at least one dose of MMR. You may also need a second dose.  Pneumococcal 13-valent conjugate (PCV13) vaccine. You may need this if you have certain conditions and were not previously vaccinated.  Pneumococcal polysaccharide (PPSV23) vaccine. You may need one or two doses if you smoke cigarettes or if you have certain conditions.  Meningococcal vaccine. One dose is recommended if you are age 27-21 years and a first-year college student living in a residence hall, or if you have one of several medical conditions. You may also need additional booster doses.  Hepatitis A vaccine. You may need this if you have certain conditions or if you travel or work in places where you may be exposed to hepatitis A.  Hepatitis B vaccine. You may need this if you have certain conditions or if you travel or work in places where you may be exposed to hepatitis B.  Haemophilus influenzae type b (Hib) vaccine. You may need this if  you have certain risk factors.  Talk to your health care provider about which screenings and vaccines you need and how often you need them. This information is not intended to replace advice given to you by your health care provider. Make sure you discuss any questions you have with your health care provider. Document Released: 06/13/2001 Document Revised: 01/05/2016  Document Reviewed: 02/16/2015 Elsevier Interactive Patient Education  [employer].

## 2018-02-28 LAB — TESTOSTERONE: TESTOSTERONE: 31 ng/dL (ref 8–48)

## 2018-02-28 LAB — PROLACTIN: PROLACTIN: 16.7 ng/mL (ref 4.8–23.3)

## 2018-03-04 LAB — CYTOLOGY - PAP: Diagnosis: NEGATIVE

## 2018-03-13 ENCOUNTER — Telehealth: Payer: Self-pay | Admitting: Obstetrics and Gynecology

## 2018-03-13 NOTE — Telephone Encounter (Signed)
The patient called and wanted to know if she is able to have her labs drawn at a different station due to her having to be in clinic at work. The patient is requesting a call back if possible. Please advise.

## 2018-03-13 NOTE — Telephone Encounter (Signed)
Will send to SS. This is an AT pt.

## 2018-03-22 ENCOUNTER — Other Ambulatory Visit: Payer: No Typology Code available for payment source

## 2018-05-09 ENCOUNTER — Ambulatory Visit: Payer: Managed Care, Other (non HMO) | Admitting: Internal Medicine

## 2018-05-22 ENCOUNTER — Encounter: Payer: Self-pay | Admitting: Obstetrics and Gynecology

## 2018-05-22 ENCOUNTER — Ambulatory Visit (INDEPENDENT_AMBULATORY_CARE_PROVIDER_SITE_OTHER): Payer: No Typology Code available for payment source | Admitting: Obstetrics and Gynecology

## 2018-05-22 VITALS — BP 127/89 | HR 91 | Wt 129.0 lb

## 2018-05-22 DIAGNOSIS — N6012 Diffuse cystic mastopathy of left breast: Secondary | ICD-10-CM

## 2018-05-22 DIAGNOSIS — Z1329 Encounter for screening for other suspected endocrine disorder: Secondary | ICD-10-CM | POA: Diagnosis not present

## 2018-05-22 DIAGNOSIS — R238 Other skin changes: Secondary | ICD-10-CM

## 2018-05-22 MED ORDER — NYSTATIN-TRIAMCINOLONE 100000-0.1 UNIT/GM-% EX CREA: 1.0000 "application " | TOPICAL_CREAM | Freq: Two times a day (BID) | CUTANEOUS | 1 refills | Status: DC

## 2018-05-22 NOTE — Progress Notes (Signed)
Obstetrics & Gynecology Office Visit   Chief Complaint:  Chief Complaint  Patient presents with  . breast check    Right breast resh few weeks ago/left side knot w/soreness    History of Present Illness: 28 year old [place] presenting for evaluation of right breast rash, as well as left breast lump.  She noted the rash about a week ago.  It initially improved a little before becoming slightly more prominent again.  The area in question is around the areola.  The lesions are pruritic.  She denies any changes in soaps, detergents, dryer sheets, or lotions.    The lesion in the left breast was incidentally noted by the patient in the last few weeks and she does report maybe slightly less prominent as she has been following.  No trauma noted.  The only family history of breast cancer is in one grandmother at age 86 at time of diagnosis.  Some mild tenderness.  No overlying skin changes.  Not currently on any exogenous hormone.  Currently in luteal phase of menstrual cycle with reported LMP Patient's last menstrual period was 04/25/2018.  Review of Systems: Review of Systems  Constitutional: Negative.   Skin: Positive for itching and rash.  All other systems reviewed and are negative.    Past Medical History:  Past Medical History:  Diagnosis Date  . FH: migraines    mom  . Migraines     Past Surgical History:  Past Surgical History:  Procedure Laterality Date  . TONSILLECTOMY      Gynecologic History: Patient's last menstrual period was 04/25/2018.  Obstetric History: G0P0000  Family History:  Family History  Problem Relation Age of Onset  . Arthritis Mother   . Depression Mother   . Depression Maternal Grandmother   . Cancer Maternal Grandmother 104       Breast  . Cancer Maternal Grandfather        ? type ?bone  . Hearing loss Maternal Grandfather     Social History:  Social History   Socioeconomic History  . Marital status: Single    Spouse name: Not on  file  . Number of children: Not on file  . Years of education: Not on file  . Highest education level: Not on file  Occupational History  . Not on file  Social Needs  . Financial resource strain: Not on file  . Food insecurity:    Worry: Not on file    Inability: Not on file  . Transportation needs:    Medical: Not on file    Non-medical: Not on file  Tobacco Use  . Smoking status: Former Research scientist (life sciences)  . Smokeless tobacco: Never Used  . Tobacco comment: social smoker   Substance and Sexual Activity  . Alcohol use: Yes  . Drug use: Not Currently  . Sexual activity: Yes    Partners: Male    Birth control/protection: None  Lifestyle  . Physical activity:    Days per week: Not on file    Minutes per session: Not on file  . Stress: Not on file  Relationships  . Social connections:    Talks on phone: Not on file    Gets together: Not on file    Attends religious service: Not on file    Active member of club or organization: Not on file    Attends meetings of clubs or organizations: Not on file    Relationship status: Not on file  . Intimate partner  violence:    Fear of current or ex partner: Not on file    Emotionally abused: Not on file    Physically abused: Not on file    Forced sexual activity: Not on file  Other Topics Concern  . Not on file  Social History Narrative   [employer] [place]    HS ed    No kids    Wears seat belt, no guns, safe in relationship     Allergies:  No Known Allergies  Medications: Prior to Admission medications   Medication Sig Start Date End Date Taking? Authorizing Provider  ondansetron (ZOFRAN) 4 MG tablet Take 1 tablet (4 mg total) by mouth every 8 (eight) hours as needed for nausea or vomiting. 09/25/17   McLean-Scocuzza, Nino Glow, MD  SUMAtriptan (IMITREX) 50 MG tablet For migraine. May repeat in 2 hours if headache persists or recurs. No more than 2x per week or 200 mg total in 1 day 09/25/17    McLean-Scocuzza, Nino Glow, MD    Physical Exam Vitals: Blood pressure 127/89, pulse 91, weight 129 lb (58.5 kg), last menstrual period 04/25/2018.  Patient's last menstrual period was 04/25/2018.  General: NAD HEENT: normocephalic, anicteric Breast symmetrical, no tenderness Left breast: no palpable nodules or masses, no skin or nipple retraction present, no nipple discharge.  There is a vesicular rash superior the areola.   Right Breast: In the area of concern in the upper outer quadrant there is a small palpable 2cm, well circumscribed, mobile nodule, no skin or nipple retraction present, no nipple discharge No axillary or supraclavicular lymphadenopathy. Pulmonary: No increased work of breathing Extremities: no edema, erythema, or tenderness Neurologic: Grossly intact Psychiatric: mood appropriate, affect full  Female chaperone present for pelvic  portions of the physical exam  Physical Exam Chest:       Assessment: 28 y.o. G0P0000 left breast lesion and right breast rash  Plan: Problem List Items Addressed This Visit    None    Visit Diagnoses    Fibrocystic disease of left breast    -  Primary   Vesicular rash       Thyroid disorder screening       Relevant Orders   TSH     1) Vesicular rash right breast - ddx includes contact dermatitis without identifiable exposure though, herpes zoster, HSV, or some sort of cutaneous hemangiotic lesion. - Trial of micolog - will re-evaluate in 2 weeks  2) Left breast lesion - the lesion in question is about 2cm, well circumscribed, mobile, and most consistent with benign fibrocystic changes.  We discussed that these may be more prominent in the luteal phase of the menstrual cycle and I would recommend re-examination in 2 week during follicular phase of menstrual cycle.  I stressed that this is almost certainly a benign lesion.  3) Infertility - separated recently from significant other so not currently attempting to conceive.   She has noted recent weight gain and we discussed thyroid as a common etiology.  Last TSH in system from 10/31/2017 normal  4) A total of 15 minutes were spent in face-to-face contact with the patient during this encounter with over half of that time devoted to counseling and coordination of care.  5) Return in about 2 weeks (around 06/05/2018) for breast exam.   Malachy Mood, MD, [place], [place]

## 2018-06-05 ENCOUNTER — Encounter: Payer: Self-pay | Admitting: Obstetrics and Gynecology

## 2018-06-05 ENCOUNTER — Ambulatory Visit (INDEPENDENT_AMBULATORY_CARE_PROVIDER_SITE_OTHER): Payer: No Typology Code available for payment source | Admitting: Obstetrics and Gynecology

## 2018-06-05 VITALS — BP 117/81 | HR 86 | Wt 126.0 lb

## 2018-06-05 DIAGNOSIS — N6321 Unspecified lump in the left breast, upper outer quadrant: Secondary | ICD-10-CM

## 2018-06-05 DIAGNOSIS — N6320 Unspecified lump in the left breast, unspecified quadrant: Secondary | ICD-10-CM

## 2018-06-05 NOTE — Progress Notes (Signed)
Obstetrics & Gynecology Office Visit   Chief Complaint:  Chief Complaint  Patient presents with  . Follow-up    Breast check    History of Present Illness: 28 y.o. G0P0000 presenting for medication follow up, repeat breast exam, for a diagnosis of right perareolar rash, and left breast lump.  She is currently being managed with mycolog.   The patient reports resolution of rash but no change in size of the left breast lump.    Review of Systems: Review of Systems  Constitutional: Negative.   Skin: Negative.      Past Medical History:  Past Medical History:  Diagnosis Date  . FH: migraines    mom  . Migraines     Past Surgical History:  Past Surgical History:  Procedure Laterality Date  . TONSILLECTOMY      Gynecologic History: No LMP recorded.  Obstetric History: G0P0000  Family History:  Family History  Problem Relation Age of Onset  . Arthritis Mother   . Depression Mother   . Depression Maternal Grandmother   . Cancer Maternal Grandmother 1       Breast  . Cancer Maternal Grandfather        ? type ?bone  . Hearing loss Maternal Grandfather     Social History:  Social History   Socioeconomic History  . Marital status: Single    Spouse name: Not on file  . Number of children: Not on file  . Years of education: Not on file  . Highest education level: Not on file  Occupational History  . Not on file  Social Needs  . Financial resource strain: Not on file  . Food insecurity:    Worry: Not on file    Inability: Not on file  . Transportation needs:    Medical: Not on file    Non-medical: Not on file  Tobacco Use  . Smoking status: Former Research scientist (life sciences)  . Smokeless tobacco: Never Used  . Tobacco comment: social smoker   Substance and Sexual Activity  . Alcohol use: Yes  . Drug use: Not Currently  . Sexual activity: Yes    Partners: Male    Birth control/protection: None  Lifestyle  . Physical activity:    Days per week: Not on file   Minutes per session: Not on file  . Stress: Not on file  Relationships  . Social connections:    Talks on phone: Not on file    Gets together: Not on file    Attends religious service: Not on file    Active member of club or organization: Not on file    Attends meetings of clubs or organizations: Not on file    Relationship status: Not on file  . Intimate partner violence:    Fear of current or ex partner: Not on file    Emotionally abused: Not on file    Physically abused: Not on file    Forced sexual activity: Not on file  Other Topics Concern  . Not on file  Social History Narrative   [employer] [place]    HS ed    No kids    Wears seat belt, no guns, safe in relationship     Allergies:  No Known Allergies  Medications: Prior to Admission medications   Medication Sig Start Date End Date Taking? Authorizing Provider  nystatin-triamcinolone (MYCOLOG II) cream Apply 1 application topically 2 (two) times daily. 05/22/18   Malachy Mood, MD  ondansetron (ZOFRAN) 4 MG tablet Take 1 tablet (4 mg total) by mouth every 8 (eight) hours as needed for nausea or vomiting. 09/25/17   McLean-Scocuzza, Nino Glow, MD  SUMAtriptan (IMITREX) 50 MG tablet For migraine. May repeat in 2 hours if headache persists or recurs. No more than 2x per week or 200 mg total in 1 day 09/25/17   McLean-Scocuzza, Nino Glow, MD    Physical Exam Vitals:  Vitals:   06/05/18 1146  BP: 117/81  Pulse: 86   No LMP recorded.  General: NAD HEENT: normocephalic, anicteric Pulmonary: No increased work of breathing Left breast: no palpable nodules or masses, no skin or nipple retraction present, no nipple discharge. Previously noted rash has resolved Right Breast: In the area of concern in the upper outer quadrant there is a small palpable 2cm, well circumscribed, mobile nodule, no skin or nipple retraction present, no nipple discharge.  Size stable from prior exam Neurologic:  Grossly intact Psychiatric: mood appropriate, affect full  Female chaperone present for pelvic  portions of the physical exam  Assessment: 28 y.o. G0P0000 follow up right breast rash and left breast lump  Plan: Problem List Items Addressed This Visit    None    Visit Diagnoses    Breast mass, left    -  Primary   Relevant Orders   Ambulatory referral to General Surgery     1) Right breast rash - resolved  2) Left breast lump - stable, this most likely represents benign fibrocystic change given her age.  I discussed we could obtain imaging but a fair number of these will then undergo biopsy.  The other option would be to allow Dr. Bary Castilla to weigh in with a second opinion.  Patient agreeable with referral to Dr. Bary Castilla.    3) A total of 15 minutes were spent in face-to-face contact with the patient during this encounter with over half of that time devoted to counseling and coordination of care.  4) Return in about 1 year (around 06/06/2019) for annual.   Malachy Mood, MD, [place], [place]

## 2018-06-09 DIAGNOSIS — N200 Calculus of kidney: Secondary | ICD-10-CM | POA: Insufficient documentation

## 2018-06-09 DIAGNOSIS — F17200 Nicotine dependence, unspecified, uncomplicated: Secondary | ICD-10-CM | POA: Diagnosis not present

## 2018-06-09 DIAGNOSIS — R1032 Left lower quadrant pain: Secondary | ICD-10-CM | POA: Diagnosis present

## 2018-06-09 MED ORDER — KETOROLAC TROMETHAMINE 30 MG/ML IJ SOLN
INTRAMUSCULAR | Status: AC
  Administered 2018-06-10: 30 mg
  Filled 2018-06-09: qty 1

## 2018-06-09 MED ORDER — ONDANSETRON HCL 4 MG/2ML IJ SOLN
INTRAMUSCULAR | Status: AC
  Filled 2018-06-09: qty 2

## 2018-06-09 NOTE — ED Triage Notes (Signed)
Pt states that she was woke up suddenly with left sided flank pain, pt states that pain is excruciating, pt is pale, states that she felt earlier some burning with urination, pt is writhing in pain, pt placed in a recliner due to feeling like she is going to pass out, pt is nauseated and vomited in triage

## 2018-06-10 ENCOUNTER — Encounter: Payer: Self-pay | Admitting: Obstetrics and Gynecology

## 2018-06-10 ENCOUNTER — Encounter: Payer: Self-pay | Admitting: Emergency Medicine

## 2018-06-10 ENCOUNTER — Other Ambulatory Visit: Payer: Self-pay

## 2018-06-10 ENCOUNTER — Emergency Department
Admission: EM | Admit: 2018-06-10 | Discharge: 2018-06-10 | Disposition: A | Discharge status: Home / Self Care | Payer: No Typology Code available for payment source | Attending: Emergency Medicine | Admitting: Emergency Medicine

## 2018-06-10 DIAGNOSIS — N23 Unspecified renal colic: Secondary | ICD-10-CM

## 2018-06-10 DIAGNOSIS — N200 Calculus of kidney: Secondary | ICD-10-CM

## 2018-06-10 LAB — COMPREHENSIVE METABOLIC PANEL
ALT: 19 U/L (ref 0–44)
AST: 22 U/L (ref 15–41)
Albumin: 4.4 g/dL (ref 3.5–5.0)
Alkaline Phosphatase: 58 U/L (ref 38–126)
Anion gap: 10 (ref 5–15)
BUN: 9 mg/dL (ref 6–20)
CO2: 19 mmol/L — ABNORMAL LOW (ref 22–32)
Calcium: 9.2 mg/dL (ref 8.9–10.3)
Chloride: 107 mmol/L (ref 98–111)
Creatinine, Ser: 0.79 mg/dL (ref 0.44–1.00)
GFR calc Af Amer: >60 mL/min (ref >60)
GFR calc non Af Amer: >60 mL/min (ref >60)
Glucose, Bld: 140 mg/dL — ABNORMAL HIGH (ref 70–99)
Potassium: 3.1 mmol/L — ABNORMAL LOW (ref 3.5–5.1)
Sodium: 136 mmol/L (ref 135–145)
TOTAL PROTEIN: 7.2 g/dL (ref 6.5–8.1)
Total Bilirubin: 0.8 mg/dL (ref 0.3–1.2)

## 2018-06-10 LAB — URINALYSIS, ROUTINE W REFLEX MICROSCOPIC
Bilirubin Urine: NEGATIVE
Glucose, UA: NEGATIVE mg/dL
Ketones, ur: 20 mg/dL — AB
Leukocytes, UA: NEGATIVE
Nitrite: NEGATIVE
Protein, ur: 100 mg/dL — AB
RBC / HPF: >50 RBC/hpf — ABNORMAL HIGH (ref 0–5)
Specific Gravity, Urine: 1.025 (ref 1.005–1.030)
Squamous Epithelial / HPF: >50 — ABNORMAL HIGH (ref 0–5)
WBC, UA: >50 WBC/hpf — ABNORMAL HIGH (ref 0–5)
pH: 5 (ref 5.0–8.0)

## 2018-06-10 LAB — CBC
HCT: 44.5 % (ref 36.0–46.0)
Hemoglobin: 14.7 g/dL (ref 12.0–15.0)
MCH: 31.1 pg (ref 26.0–34.0)
MCHC: 33 g/dL (ref 30.0–36.0)
MCV: 94.3 fL (ref 80.0–100.0)
Platelets: 286 10*3/uL (ref 150–400)
RBC: 4.72 MIL/uL (ref 3.87–5.11)
RDW: 12.9 % (ref 11.5–15.5)
WBC: 10 10*3/uL (ref 4.0–10.5)
nRBC: 0 % (ref 0.0–0.2)

## 2018-06-10 LAB — POCT PREGNANCY, URINE: Preg Test, Ur: NEGATIVE

## 2018-06-10 MED ORDER — SODIUM CHLORIDE 0.9 % IV BOLUS
1000.0000 mL | Freq: Once | INTRAVENOUS | Status: AC
  Administered 2018-06-10: 1000 mL via INTRAVENOUS

## 2018-06-10 MED ORDER — SODIUM CHLORIDE 0.9 % IV SOLN: INTRAVENOUS | Status: DC

## 2018-06-10 MED ORDER — ONDANSETRON HCL 4 MG/2ML IJ SOLN
4.0000 mg | Freq: Once | INTRAMUSCULAR | Status: AC
  Administered 2018-06-10: 4 mg via INTRAVENOUS

## 2018-06-10 MED ORDER — HYDROCODONE-ACETAMINOPHEN 5-325 MG PO TABS: 2.0000 | ORAL_TABLET | Freq: Four times a day (QID) | ORAL | 0 refills | Status: DC | PRN

## 2018-06-10 NOTE — Discharge Instructions (Addendum)
You have been seen in the Emergency Department (ED) today for pain that we believe based on your workup, is caused by kidney stones.  Fortunately it seems that you passed the stone that was bothering you when you first arrived.  As we have discussed, please drink plenty of fluids.  Please make a follow up appointment with the physician(s) listed elsewhere in this documentation.  You may take pain medication as needed but ONLY as prescribed.  Please also take your prescribed Flomax daily.  We also recommend that you take over-the-counter ibuprofen regularly according to label instructions over the next 5 days.  Take it with meals to minimize stomach discomfort.  Please see your doctor as soon as possible as stones may take 1-3 weeks to pass and you may require additional care or medications.  Do not drink alcohol, drive or participate in any other potentially dangerous activities while taking opiate pain medication as it may make you sleepy. Do not take this medication with any other sedating medications, either prescription or over-the-counter. If you were prescribed Percocet or Vicodin, do not take these with acetaminophen (Tylenol) as it is already contained within these medications.   Take Norco as needed for severe pain.  This medication is an opiate (or narcotic) pain medication and can be habit forming.  Use it as little as possible to achieve adequate pain control.  Do not use or use it with extreme caution if you have a history of opiate abuse or dependence.  If you are on a pain contract with your primary care doctor or a pain specialist, be sure to let them know you were prescribed this medication today from the [facility] Emergency Department.  This medication is intended for your use only - do not give any to anyone else and keep it in a secure place where nobody else, especially children, have access to it.  It will also cause or worsen constipation, so you may want to consider taking an  over-the-counter stool softener while you are taking this medication.  Return to the Emergency Department (ED) or call your doctor if you have any worsening pain, fever, painful urination, are unable to urinate, or develop other symptoms that concern you.

## 2018-06-10 NOTE — ED Notes (Signed)
Peripheral IV discontinued. Catheter intact. No signs of infiltration or redness. Gauze applied to IV site.   Discharge instructions reviewed with patient. Questions fielded by this RN. Patient verbalizes understanding of instructions. Patient discharged home in stable condition per forbach. No acute distress noted at time of discharge.    

## 2018-06-10 NOTE — ED Provider Notes (Signed)
[facility] Emergency Department Provider Note  ____________________________________________   First MD Initiated Contact with Patient 06/10/18 [phone]     (approximate)  I have reviewed the triage vital signs and the nursing notes.   HISTORY  Chief Complaint Flank Pain    HPI Elizabeth Berger is a 28 y.o. female with no contributory chronic medical history who presents for evaluation of acute onset severe sharp stabbing left flank pain that radiates around to the front.  This occurred while she was asleep.  The symptoms were severe and nothing in particular made them better.  Moving around made them worse.  They were accompanied with nausea and multiple episodes of vomiting.  She had a little bit of burning with urination just prior to the onset of the symptoms but has been otherwise in her usual state of health.  She denies fever/chills, chest pain, shortness of breath, and any dysuria or foul-smelling urine other than just prior to the onset of the pain.  She does not have a history of frequent urinary tract infections.  Her last menstrual period was about 2 weeks ago.   Past Medical History:  Diagnosis Date  . Migraines     There are no active problems to display for this patient.   History reviewed. No pertinent surgical history.  Prior to Admission medications   Not on File    Allergies Patient has no known allergies.  History reviewed. No pertinent family history.  Social History Social History   Tobacco Use  . Smoking status: Current Some Day Smoker  . Smokeless tobacco: Never Used  Substance Use Topics  . Alcohol use: Not on file  . Drug use: Not on file    Review of Systems Constitutional: No fever/chills Eyes: No visual changes. ENT: No sore throat. Cardiovascular: Denies chest pain. Respiratory: Denies shortness of breath. Gastrointestinal: Severe left-sided flank pain rating around to the front of her abdomen associated with  nausea and vomiting. Genitourinary: Some very mild dysuria associated with the flank pain as described above. Musculoskeletal: Left-sided flank pain. Integumentary: Negative for rash. Neurological: Negative for headaches, focal weakness or numbness.   ____________________________________________   PHYSICAL EXAM:  VITAL SIGNS: ED Triage Vitals  Enc Vitals Group     BP 06/09/18 2359 121/77     Pulse Rate 06/09/18 2359 (!) 103     Resp 06/09/18 2359 18     Temp 06/09/18 2359 98.3 F (36.8 C)     Temp Source 06/09/18 2359 Oral     SpO2 06/09/18 2359 96 %     Weight 06/10/18 0000 56.7 kg (125 lb)     Height 06/10/18 0000 1.575 m (5\' 2" )     Head Circumference --      Peak Flow --      Pain Score 06/10/18 0000 10     Pain Loc --      Pain Edu? --      Excl. in [place]? --     Constitutional: Alert and oriented. Well appearing and in no acute distress. Eyes: Conjunctivae are normal.  Head: Atraumatic. Nose: No congestion/rhinnorhea. Mouth/Throat: Mucous membranes are moist. Neck: No stridor.  No meningeal signs.   Cardiovascular: Normal rate, regular rhythm. Good peripheral circulation. Grossly normal heart sounds. Respiratory: Normal respiratory effort.  No retractions. Lungs CTAB. Gastrointestinal: Soft and nontender. No distention.  Musculoskeletal: No lower extremity tenderness nor edema. No gross deformities of extremities. Neurologic:  Normal speech and language. No gross focal neurologic deficits  are appreciated.  Skin:  Skin is warm, dry and intact. No rash noted. Psychiatric: Mood and affect are normal. Speech and behavior are normal.  ____________________________________________   LABS (all labs ordered are listed, but only abnormal results are displayed)  Labs Reviewed  COMPREHENSIVE METABOLIC PANEL - Abnormal; Notable for the following components:      Result Value   Potassium 3.1 (*)    CO2 19 (*)    Glucose, Bld 140 (*)    All other components within normal  limits  URINALYSIS, ROUTINE W REFLEX MICROSCOPIC - Abnormal; Notable for the following components:   Color, Urine AMBER (*)    APPearance TURBID (*)    Hgb urine dipstick LARGE (*)    Ketones, ur 20 (*)    Protein, ur 100 (*)    RBC / HPF >50 (*)    WBC, UA >50 (*)    Bacteria, UA RARE (*)    Squamous Epithelial / LPF >50 (*)    All other components within normal limits  URINE CULTURE  CBC  POC URINE PREG, ED  POCT PREGNANCY, URINE   ____________________________________________  EKG  None - EKG not ordered by ED physician ____________________________________________  RADIOLOGY   ED MD interpretation: No indication for imaging  Official radiology report(s): No results found.  ____________________________________________   PROCEDURES  Critical Care performed: No   Procedure(s) performed:   Procedures   ____________________________________________   INITIAL IMPRESSION / ASSESSMENT AND PLAN / ED COURSE  As part of my medical decision making, I reviewed the following data within the [place] notes reviewed and incorporated, Labs reviewed , Old chart reviewed, Notes from prior ED visits and [place] Controlled Substance Database    Differential diagnosis includes, but is not limited to, renal colic/ureterolithiasis, UTI/pyelonephritis, ovarian torsion/cyst, less likely pneumonia or renal infarction.  Based on the description of the symptoms, I strongly suspect that the patient had a kidney stone.  However by the time I saw her she had already passed the stone and in fact put it in a specimen cup and showed me.  She is no longer having severe pain and is having only some mild dull aching on the left side.  Her vital signs are stable and she has been afebrile for 4 and half hours in the ED.  She has no leukocytosis, no additional nausea or vomiting, and seems to be returning to baseline.  She has significant amount of hematuria and I have sent a  urine culture but given that she has no other signs or symptoms of infection I do not think it is necessary to treat her empirically with antibiotics.  I offered a CT scan of the abdomen and pelvis but explained that I did not think it was likely to be necessary but I did explain that we do not want her to have an infection with an infected, impacted stone.  The patient was comfortable with the plan for discharge and conservative management and promises to return to the emergency department if she develops any new or worsening symptoms.  I gave my usual customary return precautions.     ____________________________________________  FINAL CLINICAL IMPRESSION(S) / ED DIAGNOSES  Final diagnoses:  Renal colic on left side  Kidney stones     MEDICATIONS GIVEN DURING THIS VISIT:  Medications  sodium chloride 0.9 % bolus 1,000 mL (0 mLs Intravenous Stopped 06/10/18 0154)    And  0.9 %  sodium chloride infusion (has no administration  in time range)  ondansetron (ZOFRAN) injection 4 mg (4 mg Intravenous Given 06/10/18 0013)  ketorolac (TORADOL) 30 MG/ML injection (30 mg  Given 06/10/18 0013)     ED Discharge Orders    None       Note:  This document was prepared using Dragon voice recognition software and may include unintentional dictation errors.   Hinda Kehr, MD 06/10/18 [phone]

## 2018-06-11 LAB — URINE CULTURE: Special Requests: NORMAL

## 2018-06-12 ENCOUNTER — Encounter: Payer: Self-pay | Admitting: Internal Medicine

## 2018-06-21 ENCOUNTER — Other Ambulatory Visit
Admission: RE | Admit: 2018-06-21 | Discharge: 2018-06-21 | Disposition: A | Discharge status: Home / Self Care | Payer: No Typology Code available for payment source | Source: Home / Self Care | Attending: Urology | Admitting: Urology

## 2018-06-21 ENCOUNTER — Ambulatory Visit
Admission: RE | Admit: 2018-06-21 | Discharge: 2018-06-21 | Disposition: A | Discharge status: Home / Self Care | Payer: No Typology Code available for payment source | Source: Ambulatory Visit | Attending: Urology | Admitting: Urology

## 2018-06-21 ENCOUNTER — Ambulatory Visit
Admission: RE | Admit: 2018-06-21 | Discharge: 2018-06-21 | Disposition: A | Discharge status: Home / Self Care | Payer: No Typology Code available for payment source | Attending: Urology | Admitting: Urology

## 2018-06-21 ENCOUNTER — Encounter: Payer: Self-pay | Admitting: Urology

## 2018-06-21 ENCOUNTER — Ambulatory Visit (INDEPENDENT_AMBULATORY_CARE_PROVIDER_SITE_OTHER): Payer: No Typology Code available for payment source | Admitting: Urology

## 2018-06-21 ENCOUNTER — Other Ambulatory Visit: Payer: Self-pay

## 2018-06-21 VITALS — BP 105/74 | HR 96 | Ht 62.0 in | Wt 126.0 lb

## 2018-06-21 DIAGNOSIS — N200 Calculus of kidney: Secondary | ICD-10-CM | POA: Insufficient documentation

## 2018-06-21 DIAGNOSIS — Z87442 Personal history of urinary calculi: Secondary | ICD-10-CM | POA: Diagnosis not present

## 2018-06-21 LAB — URINALYSIS, COMPLETE (UACMP) WITH MICROSCOPIC
BILIRUBIN URINE: NEGATIVE
Glucose, UA: NEGATIVE mg/dL
Hgb urine dipstick: NEGATIVE
Ketones, ur: NEGATIVE mg/dL
Nitrite: NEGATIVE
Protein, ur: NEGATIVE mg/dL
Specific Gravity, Urine: 1.015 (ref 1.005–1.030)
pH: 7 (ref 5.0–8.0)

## 2018-06-21 NOTE — Progress Notes (Signed)
06/21/2018 6:44 PM   Elizabeth Berger Elizabeth Berger 1990/11/19 [id number]  Referring provider: McLean-Scocuzza, Nino Glow, MD [place], [place] [address]  Chief Complaint  Patient presents with  . Nephrolithiasis    follow up    HPI: 28 year old female who presents today for further evaluation of kidney stones.  She was seen and evaluated in the emergency room on 06/10/2018 with severe acute onset severe left flank pain radiating from the back to the left lower quadrant.  This awoke her from her sleep.  She never have severe pain like this in the past.  In the emergency room, she passed the stone several hours later with IV fluids and pain management.  She also had a suspicious appearing urine which grew multiple species.  She was not treated with any antibiotics.  She had no associated urinary symptoms.  No further episodes of flank pain.  No personal history of kidney stones.  She does have a family history of kidney stones.   She does drink a large amount of water.   PMH: Past Medical History:  Diagnosis Date  . FH: migraines    mom  . Migraines     Surgical History: Past Surgical History:  Procedure Laterality Date  . TONSILLECTOMY      Home Medications:  Allergies as of 06/21/2018   No Known Allergies     Medication List       Accurate as of June 21, 2018 11:59 PM. Always use your most recent med list.        nystatin-triamcinolone cream Commonly known as:  MYCOLOG II Apply 1 application topically 2 (two) times daily.   SUMAtriptan 50 MG tablet Commonly known as:  IMITREX For migraine. May repeat in 2 hours if headache persists or recurs. No more than 2x per week or 200 mg total in 1 day       Allergies: No Known Allergies  Family History: Family History  Problem Relation Age of Onset  . Arthritis Mother   . Depression Mother   . Depression Maternal Grandmother   . Cancer Maternal Grandmother 27       Breast  . Cancer Maternal  Grandfather        ? type ?bone  . Hearing loss Maternal Grandfather     Social History:  reports that she has been smoking. She has never used smokeless tobacco. She reports current alcohol use. She reports previous drug use.  ROS: UROLOGY Frequent Urination?: No Hard to postpone urination?: No Burning/pain with urination?: No Get up at night to urinate?: No Leakage of urine?: No Urine stream starts and stops?: No Trouble starting stream?: No Do you have to strain to urinate?: No Blood in urine?: No Urinary tract infection?: No Sexually transmitted disease?: No Injury to kidneys or bladder?: No Painful intercourse?: No Weak stream?: No Currently pregnant?: No Vaginal bleeding?: No Last menstrual period?: 05/27/2018  Gastrointestinal Nausea?: No Vomiting?: No Indigestion/heartburn?: No Diarrhea?: No Constipation?: No  Constitutional Fever: No Night sweats?: No Weight loss?: Yes Fatigue?: No  Skin Skin rash/lesions?: No Itching?: No  Eyes Blurred vision?: No Double vision?: No  Ears/Nose/Throat Sore throat?: No Sinus problems?: No  Hematologic/Lymphatic Swollen glands?: No Easy bruising?: No  Cardiovascular Leg swelling?: No Chest pain?: No  Respiratory Cough?: No Shortness of breath?: No  Endocrine Excessive thirst?: No  Musculoskeletal Back pain?: No Joint pain?: No  Neurological Headaches?: No Dizziness?: No  Psychologic Depression?: No Anxiety?: No  Physical Exam: BP 105/74  Pulse 96   Ht 5\' 2"  (1.575 m)   Wt 126 lb (57.2 kg)   LMP 05/27/2018 (Exact Date)   BMI 23.05 kg/m   Constitutional:  Alert and oriented, No acute distress. HEENT: [place] AT, moist mucus membranes.  Trachea midline, no masses. Cardiovascular: No clubbing, cyanosis, or edema. Respiratory: Normal respiratory effort, no increased work of breathing. GI: Abdomen is soft, nontender, nondistended, no abdominal masses GU: No CVA tenderness Neurologic: Grossly  intact, no focal deficits, moving all 4 extremities. Psychiatric: Normal mood and affect.  Laboratory Data: Lab Results  Component Value Date   WBC 10.0 06/10/2018   HGB 14.7 06/10/2018   HCT 44.5 06/10/2018   MCV 94.3 06/10/2018   PLT 286 06/10/2018    Lab Results  Component Value Date   CREATININE 0.79 06/10/2018   Urinalysis Results for orders placed or performed during the hospital encounter of 06/21/18  Urinalysis, Complete w Microscopic  Result Value Ref Range   Color, Urine STRAW (A) YELLOW   APPearance CLEAR CLEAR   Specific Gravity, Urine 1.015 1.005 - 1.030   pH 7.0 5.0 - 8.0   Glucose, UA NEGATIVE NEGATIVE mg/dL   Hgb urine dipstick NEGATIVE NEGATIVE   Bilirubin Urine NEGATIVE NEGATIVE   Ketones, ur NEGATIVE NEGATIVE mg/dL   Protein, ur NEGATIVE NEGATIVE mg/dL   Nitrite NEGATIVE NEGATIVE   Leukocytes,Ua SMALL (A) NEGATIVE   Squamous Epithelial / LPF 6-10 0 - 5   WBC, UA 6-10 0 - 5 WBC/hpf   RBC / HPF 0-5 0 - 5 RBC/hpf   Bacteria, UA RARE (A) NONE SEEN    Pertinent Imaging:  CLINICAL DATA:  History of left flank pain  EXAM: ABDOMEN - 1 VIEW  COMPARISON:  None.  FINDINGS: The bowel gas pattern is normal. No radio-opaque calculi or other significant radiographic abnormality are seen. Moderate stool in the colon.  IMPRESSION: Negative.   Electronically Signed   By: Donavan Foil M.D.   On: 06/21/2018 23:35  KUB personally reviewed, agree with radiological interpretation   Assessment & Plan:    1. History of kidney stones Recently passed small ureteral calculus  She will bring in stone for stone analysis- will call with results  KUB today without additional stone burden appreciated  We discussed general stone prevention techniques including drinking plenty water with goal of producing 2.5 L urine daily, increased citric acid intake, avoidance of high oxalate containing foods, and decreased salt intake.  Information about dietary  recommendations given today.    F/u prn  Hollice Espy, MD  [facility] [address], [place], [place] [address] [phone]

## 2018-07-01 ENCOUNTER — Other Ambulatory Visit: Payer: Self-pay | Admitting: Urology

## 2018-07-01 ENCOUNTER — Telehealth: Payer: Self-pay | Admitting: Urology

## 2018-07-01 NOTE — Telephone Encounter (Signed)
Called pt no answer. LM informing her of the information below. Advised pt to call back for questions or concerns.

## 2018-07-01 NOTE — Telephone Encounter (Signed)
Please let Elizabeth Berger know that her stone was primarily calcium oxalate.  Recommendations are as previously discussed, please have her review her stone book.  Hollice Espy, MD

## 2018-07-02 ENCOUNTER — Ambulatory Visit: Payer: No Typology Code available for payment source | Admitting: General Surgery

## 2018-07-05 ENCOUNTER — Ambulatory Visit: Payer: No Typology Code available for payment source | Admitting: Urology

## 2018-08-20 ENCOUNTER — Other Ambulatory Visit: Payer: Self-pay | Admitting: Internal Medicine

## 2018-08-20 ENCOUNTER — Other Ambulatory Visit: Payer: Self-pay

## 2018-08-20 ENCOUNTER — Other Ambulatory Visit (INDEPENDENT_AMBULATORY_CARE_PROVIDER_SITE_OTHER): Payer: No Typology Code available for payment source

## 2018-08-20 ENCOUNTER — Encounter: Payer: Self-pay | Admitting: Family Medicine

## 2018-08-20 DIAGNOSIS — Z113 Encounter for screening for infections with a predominantly sexual mode of transmission: Secondary | ICD-10-CM

## 2018-08-21 LAB — HIV ANTIBODY (ROUTINE TESTING W REFLEX): HIV 1&2 Ab, 4th Generation: NONREACTIVE

## 2018-08-24 ENCOUNTER — Encounter: Payer: Self-pay | Admitting: Internal Medicine

## 2018-08-26 ENCOUNTER — Other Ambulatory Visit: Payer: Self-pay | Admitting: Internal Medicine

## 2018-08-26 DIAGNOSIS — G43909 Migraine, unspecified, not intractable, without status migrainosus: Secondary | ICD-10-CM

## 2018-08-26 MED ORDER — SUMATRIPTAN SUCCINATE 50 MG PO TABS: ORAL_TABLET | ORAL | 2 refills | Status: DC

## 2018-09-24 ENCOUNTER — Ambulatory Visit: Payer: No Typology Code available for payment source | Admitting: Internal Medicine

## 2018-10-14 ENCOUNTER — Encounter: Payer: Self-pay | Admitting: *Deleted

## 2018-11-05 ENCOUNTER — Encounter: Payer: No Typology Code available for payment source | Admitting: Internal Medicine

## 2019-01-10 ENCOUNTER — Encounter (HOSPITAL_COMMUNITY): Payer: Self-pay

## 2019-01-10 ENCOUNTER — Ambulatory Visit (HOSPITAL_COMMUNITY)
Admission: RE | Admit: 2019-01-10 | Discharge: 2019-01-10 | Disposition: A | Discharge status: Home / Self Care | Payer: No Typology Code available for payment source | Source: Ambulatory Visit | Attending: Urology | Admitting: Urology

## 2019-01-10 ENCOUNTER — Other Ambulatory Visit: Payer: Self-pay

## 2019-01-10 ENCOUNTER — Telehealth: Payer: Self-pay | Admitting: *Deleted

## 2019-01-10 ENCOUNTER — Other Ambulatory Visit (HOSPITAL_COMMUNITY)
Admission: RE | Admit: 2019-01-10 | Discharge: 2019-01-10 | Disposition: A | Discharge status: Home / Self Care | Payer: No Typology Code available for payment source | Source: Ambulatory Visit | Attending: Urology | Admitting: Urology

## 2019-01-10 ENCOUNTER — Telehealth: Payer: Self-pay

## 2019-01-10 DIAGNOSIS — R109 Unspecified abdominal pain: Secondary | ICD-10-CM

## 2019-01-10 DIAGNOSIS — N200 Calculus of kidney: Secondary | ICD-10-CM | POA: Diagnosis present

## 2019-01-10 LAB — URINALYSIS, ROUTINE W REFLEX MICROSCOPIC
Bacteria, UA: NONE SEEN
Bilirubin Urine: NEGATIVE
Glucose, UA: NEGATIVE mg/dL
Hgb urine dipstick: NEGATIVE
Ketones, ur: NEGATIVE mg/dL
Nitrite: NEGATIVE
Protein, ur: NEGATIVE mg/dL
Specific Gravity, Urine: 1.005 (ref 1.005–1.030)
pH: 6 (ref 5.0–8.0)

## 2019-01-10 NOTE — Telephone Encounter (Signed)
Patient called today stating that she is having right flank pain, with some GI distress. She had uti symptoms last week and started cranberry juice and water.  Per Dr. Erlene Quan patient to go for UA and KUB will call with results

## 2019-01-10 NOTE — Telephone Encounter (Addendum)
Informed patient-verbalized understanding.   ----- Message from Hollice Espy, MD sent at 01/10/2019  2:52 PM EDT ----- Urine looks totally fine, no UTI or blood.  Please also let her know that I do not see any obvious stones on her x-ray.  I am waiting for the radiologist read but if there is a stone, its probably very small.  Push fluids.  Hollice Espy, MD

## 2019-01-13 ENCOUNTER — Telehealth: Payer: Self-pay

## 2019-01-13 NOTE — Telephone Encounter (Signed)
Called pt no answer. LM for pt informing her of the information below per DPR. Advised pt to call back for questions or concerns.  

## 2019-01-13 NOTE — Telephone Encounter (Signed)
-----   Message from Hollice Espy, MD sent at 01/13/2019  9:00 AM EDT ----- Radiologist reads as no stone as I interpreted Friday.  If your symptoms do not get better, we can always do a CT scan or ultrasound.  Please let us know.  Hollice Espy, MD

## 2019-01-25 ENCOUNTER — Encounter: Payer: Self-pay | Admitting: Internal Medicine

## 2019-01-27 ENCOUNTER — Other Ambulatory Visit: Payer: Self-pay | Admitting: Internal Medicine

## 2019-01-27 DIAGNOSIS — Z1329 Encounter for screening for other suspected endocrine disorder: Secondary | ICD-10-CM

## 2019-01-27 DIAGNOSIS — R739 Hyperglycemia, unspecified: Secondary | ICD-10-CM

## 2019-01-27 DIAGNOSIS — E876 Hypokalemia: Secondary | ICD-10-CM

## 2019-01-27 DIAGNOSIS — Z1389 Encounter for screening for other disorder: Secondary | ICD-10-CM

## 2019-01-27 DIAGNOSIS — N3000 Acute cystitis without hematuria: Secondary | ICD-10-CM

## 2019-01-27 DIAGNOSIS — Z13220 Encounter for screening for lipoid disorders: Secondary | ICD-10-CM

## 2019-01-27 DIAGNOSIS — Z1159 Encounter for screening for other viral diseases: Secondary | ICD-10-CM

## 2019-01-27 DIAGNOSIS — Z113 Encounter for screening for infections with a predominantly sexual mode of transmission: Secondary | ICD-10-CM

## 2019-02-13 ENCOUNTER — Ambulatory Visit: Payer: No Typology Code available for payment source | Admitting: Family Medicine

## 2019-02-14 ENCOUNTER — Encounter: Payer: Self-pay | Admitting: Certified Nurse Midwife

## 2019-02-14 ENCOUNTER — Other Ambulatory Visit (HOSPITAL_COMMUNITY)
Admission: RE | Admit: 2019-02-14 | Discharge: 2019-02-14 | Disposition: A | Discharge status: Home / Self Care | Payer: No Typology Code available for payment source | Source: Ambulatory Visit | Attending: Certified Nurse Midwife | Admitting: Certified Nurse Midwife

## 2019-02-14 ENCOUNTER — Ambulatory Visit (INDEPENDENT_AMBULATORY_CARE_PROVIDER_SITE_OTHER): Payer: No Typology Code available for payment source | Admitting: Certified Nurse Midwife

## 2019-02-14 ENCOUNTER — Other Ambulatory Visit: Payer: Self-pay

## 2019-02-14 VITALS — BP 107/83 | HR 107 | Ht 62.0 in | Wt 124.5 lb

## 2019-02-14 DIAGNOSIS — Z13220 Encounter for screening for lipoid disorders: Secondary | ICD-10-CM

## 2019-02-14 DIAGNOSIS — Z01419 Encounter for gynecological examination (general) (routine) without abnormal findings: Secondary | ICD-10-CM

## 2019-02-14 DIAGNOSIS — R634 Abnormal weight loss: Secondary | ICD-10-CM

## 2019-02-14 DIAGNOSIS — Z202 Contact with and (suspected) exposure to infections with a predominantly sexual mode of transmission: Secondary | ICD-10-CM | POA: Diagnosis not present

## 2019-02-14 DIAGNOSIS — Z136 Encounter for screening for cardiovascular disorders: Secondary | ICD-10-CM

## 2019-02-14 MED ORDER — IMIQUIMOD 5 % EX CREA: TOPICAL_CREAM | CUTANEOUS | 0 refills | Status: DC

## 2019-02-14 NOTE — Progress Notes (Signed)
GYNECOLOGY ANNUAL PREVENTATIVE CARE ENCOUNTER NOTE  History:     Elizabeth Berger is a 28 y.o. No obstetric history on file. female here for a routine annual gynecologic exam.  Current complaints: none.  States she has had weight loss, She has changed her diet a little. She also has more vaginal "skin growth" (warts).  Denies abnormal vaginal bleeding, discharge, pelvic pain, problems with intercourse or other gynecologic concerns. She has had 2 new partner and sometimes uses condoms. Would like STD testing.    Gynecologic History No LMP recorded. Contraception: condoms, some times Last Pap: 02/27/18. Results were: normal Last mammogram: n/a  Obstetric History OB History  No obstetric history on file.    Past Medical History:  Diagnosis Date  . FH: migraines    mom  . Migraines     Past Surgical History:  Procedure Laterality Date  . TONSILLECTOMY      Current Outpatient Medications on File Prior to Visit  Medication Sig Dispense Refill  . nystatin-triamcinolone (MYCOLOG II) cream Apply 1 application topically 2 (two) times daily. 30 g 1  . SUMAtriptan (IMITREX) 50 MG tablet For migraine. May repeat in 2 hours if headache persists or recurs. No more than 2x per week or 200 mg total in 1 day 10 tablet 2   No current facility-administered medications on file prior to visit.     No Known Allergies  Social History:  reports that she has been smoking. She has never used smokeless tobacco. She reports current alcohol use. She reports previous drug use.  Family History  Problem Relation Age of Onset  . Arthritis Mother   . Depression Mother   . Depression Maternal Grandmother   . Cancer Maternal Grandmother 8       Breast  . Cancer Maternal Grandfather        ? type ?bone  . Hearing loss Maternal Grandfather     The following portions of the patient's history were reviewed and updated as appropriate: allergies, current medications, past family history, past  medical history, past social history, past surgical history and problem list.  Review of Systems Pertinent items noted in HPI and remainder of comprehensive ROS otherwise negative.  Physical Exam:  There were no vitals taken for this visit. CONSTITUTIONAL: Well-developed, well-nourished female in no acute distress.  HENT:  Normocephalic, atraumatic, External right and left ear normal. Oropharynx is clear and moist EYES: Conjunctivae and EOM are normal. Pupils are equal, round, and reactive to light. No scleral icterus.  NECK: Normal range of motion, supple, no masses.  Normal thyroid.  SKIN: Skin is warm and dry. No rash noted. Not diaphoretic. No erythema. No pallor. MUSCULOSKELETAL: Normal range of motion. No tenderness.  No cyanosis, clubbing, or edema.  2+ distal pulses. NEUROLOGIC: Alert and oriented to person, place, and time. Normal reflexes, muscle tone coordination. No cranial nerve deficit noted. PSYCHIATRIC: Normal mood and affect. Normal behavior. Normal judgment and thought content. CARDIOVASCULAR: Normal heart rate noted, regular rhythm RESPIRATORY: Clear to auscultation bilaterally. Effort and breath sounds normal, no problems with respiration noted. BREASTS: Symmetric in size. Left breast has small 1 cm mass approx 3 oclock, mobile , non tender that has not changed, no skin changes, nipple drainage, or lymphadenopathy. ABDOMEN: Soft, normal bowel sounds, no distention noted.  No tenderness, rebound or guarding.  PELVIC: Normal appearing external genitalia; multiple small wort's noted. normal appearing vaginal mucosa and cervix.  No abnormal discharge noted.  Swab collected STD testing  Normal uterine size, no other palpable masses, no uterine or adnexal tenderness.   Assessment and Plan:  Annual Well Women GYN Exam  Pap not indicated  Mammogram -not indicated Labs: STD testing, Swab collected, Lipid profile and TSH panel RX imiquimod cream for wort treatment Routine  preventative health maintenance measures emphasized.Reviewed safe sex practices.  Please refer to After Visit Summary for other counseling recommendations.      Philip Aspen, CNM

## 2019-02-14 NOTE — Patient Instructions (Signed)

## 2019-02-18 LAB — HIV ANTIBODY (ROUTINE TESTING W REFLEX): HIV Screen 4th Generation wRfx: NONREACTIVE

## 2019-02-18 LAB — THYROID PANEL WITH TSH
Free Thyroxine Index: 2.3 (ref 1.2–4.9)
T3 Uptake Ratio: 29 % (ref 24–39)
T4, Total: 7.8 ug/dL (ref 4.5–12.0)
TSH: 1.82 u[IU]/mL (ref 0.450–4.500)

## 2019-02-18 LAB — HEPATITIS B SURFACE ANTIGEN: Hepatitis B Surface Ag: NEGATIVE

## 2019-02-18 LAB — LIPID PANEL
Chol/HDL Ratio: 2.5 ratio (ref 0.0–4.4)
Cholesterol, Total: 191 mg/dL (ref 100–199)
HDL: 76 mg/dL (ref >39)
LDL Chol Calc (NIH): 103 mg/dL — ABNORMAL HIGH (ref 0–99)
Triglycerides: 67 mg/dL (ref 0–149)
VLDL Cholesterol Cal: 12 mg/dL (ref 5–40)

## 2019-02-18 LAB — RPR: RPR Ser Ql: NONREACTIVE

## 2019-02-18 LAB — HSV 1 AND 2 IGM ABS, INDIRECT
HSV 1 IgM: <1:10 {titer}
HSV 2 IgM: <1:10 {titer}

## 2019-02-19 LAB — CERVICOVAGINAL ANCILLARY ONLY
Bacterial Vaginitis (gardnerella): POSITIVE — AB
Candida Glabrata: NEGATIVE
Candida Vaginitis: NEGATIVE
Chlamydia: NEGATIVE
Comment: NEGATIVE
Comment: NEGATIVE
Comment: NEGATIVE
Comment: NEGATIVE
Comment: NEGATIVE
Comment: NORMAL
Neisseria Gonorrhea: NEGATIVE
Trichomonas: NEGATIVE

## 2019-02-24 ENCOUNTER — Other Ambulatory Visit: Payer: Self-pay | Admitting: Certified Nurse Midwife

## 2019-02-24 MED ORDER — METRONIDAZOLE 500 MG PO TABS: 500.0000 mg | ORAL_TABLET | Freq: Two times a day (BID) | ORAL | 0 refills | Status: AC

## 2019-02-24 NOTE — Progress Notes (Signed)
Vaginal swab positive for BV. Orders placed for treatment. Pt notified via my chart.  Philip Aspen, CNM

## 2019-02-28 ENCOUNTER — Other Ambulatory Visit: Payer: Self-pay | Admitting: *Deleted

## 2019-02-28 DIAGNOSIS — Z20822 Contact with and (suspected) exposure to covid-19: Secondary | ICD-10-CM

## 2019-03-01 LAB — NOVEL CORONAVIRUS, NAA: SARS-CoV-2, NAA: NOT DETECTED

## 2019-03-14 ENCOUNTER — Encounter: Payer: No Typology Code available for payment source | Admitting: Internal Medicine

## 2019-04-18 ENCOUNTER — Ambulatory Visit (INDEPENDENT_AMBULATORY_CARE_PROVIDER_SITE_OTHER): Payer: No Typology Code available for payment source | Admitting: Internal Medicine

## 2019-04-18 ENCOUNTER — Encounter: Payer: Self-pay | Admitting: Internal Medicine

## 2019-04-18 ENCOUNTER — Other Ambulatory Visit: Payer: Self-pay

## 2019-04-18 VITALS — Ht 62.0 in | Wt 125.0 lb

## 2019-04-18 DIAGNOSIS — K219 Gastro-esophageal reflux disease without esophagitis: Secondary | ICD-10-CM | POA: Insufficient documentation

## 2019-04-18 DIAGNOSIS — Z87442 Personal history of urinary calculi: Secondary | ICD-10-CM | POA: Diagnosis not present

## 2019-04-18 DIAGNOSIS — G43909 Migraine, unspecified, not intractable, without status migrainosus: Secondary | ICD-10-CM | POA: Diagnosis not present

## 2019-04-18 DIAGNOSIS — Z1329 Encounter for screening for other suspected endocrine disorder: Secondary | ICD-10-CM

## 2019-04-18 DIAGNOSIS — Z0000 Encounter for general adult medical examination without abnormal findings: Secondary | ICD-10-CM | POA: Diagnosis not present

## 2019-04-18 DIAGNOSIS — Z13220 Encounter for screening for lipoid disorders: Secondary | ICD-10-CM

## 2019-04-18 MED ORDER — BUTALBITAL-APAP-CAFFEINE 50-325-40 MG PO TABS: 1.0000 | ORAL_TABLET | Freq: Two times a day (BID) | ORAL | 5 refills | Status: AC | PRN

## 2019-04-18 NOTE — Patient Instructions (Signed)
Try magnesium 250 mg to no more than 500 mg daily to prevent migraines   Acetaminophen; Butalbital; Caffeine tablets or capsules What is this medicine? ACETAMINOPHEN; BUTALBITAL; CAFFEINE (a set a MEE noe fen; byoo TAL bi tal; KAF een) is a pain reliever. It is used to treat tension headaches. This medicine may be used for other purposes; ask your health care provider or pharmacist if you have questions. COMMON BRAND NAME(S): Alagesic, Americet, Anolor-300, Arcet, BAC, CAPACET, Dolgic Plus, Esgic, Esgic Plus, Ezol, Fioricet, [employer], Medigesic, [place], Pacaps, Phrenilin Forte, Repan, [place], Triad, Zebutal What should I tell my health care provider before I take this medicine? They need to know if you have any of these conditions:  drug abuse or addiction  heart or circulation problems  if you often drink alcohol  kidney disease or problems going to the bathroom  liver disease  lung disease, asthma, or breathing problems  porphyria  an unusual or allergic reaction to acetaminophen, butalbital or other barbiturates, caffeine, other medicines, foods, dyes, or preservatives  pregnant or trying to get pregnant  breast-feeding How should I use this medicine? Take this medicine by mouth with a full glass of water. Follow the directions on the prescription label. If the medicine upsets your stomach, take the medicine with food or milk. Do not take more than you are told to take. Talk to your pediatrician regarding the use of this medicine in children. Special care may be needed. Overdosage: If you think you have taken too much of this medicine contact a poison control center or emergency room at once. NOTE: This medicine is only for you. Do not share this medicine with others. What if I miss a dose? If you miss a dose, take it as soon as you can. If it is almost time for your next dose, take only that dose. Do not take double or extra doses. What may interact with this  medicine?  alcohol or medicines that contain alcohol  antidepressants, especially MAOIs like isocarboxazid, phenelzine, tranylcypromine, and selegiline  antihistamines  benzodiazepines  carbamazepine  isoniazid  medicines for pain like pentazocine, buprenorphine, butorphanol, nalbuphine, tramadol, and propoxyphene  muscle relaxants  naltrexone  phenobarbital, phenytoin, and fosphenytoin  phenothiazines like perphenazine, thioridazine, chlorpromazine, mesoridazine, fluphenazine, prochlorperazine, promazine, and trifluoperazine  voriconazole This list may not describe all possible interactions. Give your health care provider a list of all the medicines, herbs, non-prescription drugs, or dietary supplements you use. Also tell them if you smoke, drink alcohol, or use illegal drugs. Some items may interact with your medicine. What should I watch for while using this medicine? Tell your doctor or health care professional if your pain does not go away, if it gets worse, or if you have new or a different type of pain. You may develop tolerance to the medicine. Tolerance means that you will need a higher dose of the medicine for pain relief. Tolerance is normal and is expected if you take the medicine for a long time. Do not suddenly stop taking your medicine because you may develop a severe reaction. Your body becomes used to the medicine. This does NOT mean you are addicted. Addiction is a behavior related to getting and using a drug for a non-medical reason. If you have pain, you have a medical reason to take pain medicine. Your doctor will tell you how much medicine to take. If your doctor wants you to stop the medicine, the dose will be slowly lowered over time to avoid any  side effects. You may get drowsy or dizzy when you first start taking the medicine or change doses. Do not drive, use machinery, or do anything that may be dangerous until you know how the medicine affects you. Stand or  sit up slowly. Do not take other medicines that contain acetaminophen with this medicine. Always read labels carefully. If you have questions, ask your doctor or pharmacist. If you take too much acetaminophen get medical help right away. Too much acetaminophen can be very dangerous and cause liver damage. Even if you do not have symptoms, it is important to get help right away. What side effects may I notice from receiving this medicine? Side effects that you should report to your doctor or health care professional as soon as possible:  allergic reactions like skin rash, itching or hives, swelling of the face, lips, or tongue  breathing problems  confusion  feeling faint or lightheaded, falls  redness, blistering, peeling or loosening of the skin, including inside the mouth  seizure  stomach pain  yellowing of the eyes or skin Side effects that usually do not require medical attention (report to your doctor or health care professional if they continue or are bothersome):  constipation  nausea, vomiting This list may not describe all possible side effects. Call your doctor for medical advice about side effects. You may report side effects to FDA at 1-800-FDA-1088. Where should I keep my medicine? Keep out of the reach of children. This medicine can be abused. Keep your medicine in a safe place to protect it from theft. Do not share this medicine with anyone. Selling or giving away this medicine is dangerous and against the law. This medicine may cause accidental overdose and death if it taken by other adults, children, or pets. Mix any unused medicine with a substance like cat litter or coffee grounds. Then throw the medicine away in a sealed container like a sealed bag or a coffee can with a lid. Do not use the medicine after the expiration date. Store at room temperature between 15 and 30 degrees C (59 and 86 degrees F). NOTE: This sheet is a summary. It may not cover all possible  information. If you have questions about this medicine, talk to your doctor, pharmacist, or health care provider.  2020 Elsevier/Gold Standard (2013-06-13 15:00:25)  Migraine Headache A migraine headache is an intense, throbbing pain on one side or both sides of the head. Migraine headaches may also cause other symptoms, such as nausea, vomiting, and sensitivity to light and noise. A migraine headache can last from 4 hours to 3 days. Talk with your doctor about what things may bring on (trigger) your migraine headaches. What are the causes? The exact cause of this condition is not known. However, a migraine may be caused when nerves in the brain become irritated and release chemicals that cause inflammation of blood vessels. This inflammation causes pain. This condition may be triggered or caused by:  Drinking alcohol.  Smoking.  Taking medicines, such as: ? Medicine used to treat chest pain (nitroglycerin). ? Birth control pills. ? Estrogen. ? Certain blood pressure medicines.  Eating or drinking products that contain nitrates, glutamate, aspartame, or tyramine. Aged cheeses, chocolate, or caffeine may also be triggers.  Doing physical activity. Other things that may trigger a migraine headache include:  Menstruation.  Pregnancy.  Hunger.  Stress.  Lack of sleep or too much sleep.  Weather changes.  Fatigue. What increases the risk? The following factors may make  you more likely to experience migraine headaches:  Being a certain age. This condition is more common in people who are 96-15 years old.  Being female.  Having a family history of migraine headaches.  Being Caucasian.  Having a mental health condition, such as depression or anxiety.  Being obese. What are the signs or symptoms? The main symptom of this condition is pulsating or throbbing pain. This pain may:  Happen in any area of the head, such as on one side or both sides.  Interfere with daily  activities.  Get worse with physical activity.  Get worse with exposure to bright lights or loud noises. Other symptoms may include:  Nausea.  Vomiting.  Dizziness.  General sensitivity to bright lights, loud noises, or smells. Before you get a migraine headache, you may get warning signs (an aura). An aura may include:  Seeing flashing lights or having blind spots.  Seeing bright spots, halos, or zigzag lines.  Having tunnel vision or blurred vision.  Having numbness or a tingling feeling.  Having trouble talking.  Having muscle weakness. Some people have symptoms after a migraine headache (postdromal phase), such as:  Feeling tired.  Difficulty concentrating. How is this diagnosed? A migraine headache can be diagnosed based on:  Your symptoms.  A physical exam.  Tests, such as: ? CT scan or an MRI of the head. These imaging tests can help rule out other causes of headaches. ? Taking fluid from the spine (lumbar puncture) and analyzing it (cerebrospinal fluid analysis, or CSF analysis). How is this treated? This condition may be treated with medicines that:  Relieve pain.  Relieve nausea.  Prevent migraine headaches. Treatment for this condition may also include:  Acupuncture.  Lifestyle changes like avoiding foods that trigger migraine headaches.  Biofeedback.  Cognitive behavioral therapy. Follow these instructions at home: Medicines  Take over-the-counter and prescription medicines only as told by your health care provider.  Ask your health care provider if the medicine prescribed to you: ? Requires you to avoid driving or using heavy machinery. ? Can cause constipation. You may need to take these actions to prevent or treat constipation:  Drink enough fluid to keep your urine pale yellow.  Take over-the-counter or prescription medicines.  Eat foods that are high in fiber, such as beans, whole grains, and fresh fruits and  vegetables.  Limit foods that are high in fat and processed sugars, such as fried or sweet foods. Lifestyle  Do not drink alcohol.  Do not use any products that contain nicotine or tobacco, such as cigarettes, e-cigarettes, and chewing tobacco. If you need help quitting, ask your health care provider.  Get at least 8 hours of sleep every night.  Find ways to manage stress, such as meditation, deep breathing, or yoga. General instructions      Keep a journal to find out what may trigger your migraine headaches. For example, write down: ? What you eat and drink. ? How much sleep you get. ? Any change to your diet or medicines.  If you have a migraine headache: ? Avoid things that make your symptoms worse, such as bright lights. ? It may help to lie down in a dark, quiet room. ? Do not drive or use heavy machinery. ? Ask your health care provider what activities are safe for you while you are experiencing symptoms.  Keep all follow-up visits as told by your health care provider. This is important. Contact a health care provider if:  You  develop symptoms that are different or more severe than your usual migraine headache symptoms.  You have more than 15 headache days in one month. Get help right away if:  Your migraine headache becomes severe.  Your migraine headache lasts longer than 72 hours.  You have a fever.  You have a stiff neck.  You have vision loss.  Your muscles feel weak or like you cannot control them.  You start to lose your balance often.  You have trouble walking.  You faint.  You have a seizure. Summary  A migraine headache is an intense, throbbing pain on one side or both sides of the head. Migraines may also cause other symptoms, such as nausea, vomiting, and sensitivity to light and noise.  This condition may be treated with medicines and lifestyle changes. You may also need to avoid certain things that trigger a migraine headache.  Keep a  journal to find out what may trigger your migraine headaches.  Contact your health care provider if you have more than 15 headache days in a month or you develop symptoms that are different or more severe than your usual migraine headache symptoms. This information is not intended to replace advice given to you by your health care provider. Make sure you discuss any questions you have with your health care provider. Document Released: 04/17/2005 Document Revised: 08/09/2018 Document Reviewed: 05/30/2018 Elsevier Patient Education  2020 [place].  Dietary Guidelines to Help Prevent Kidney Stones Kidney stones are deposits of minerals and salts that form inside your kidneys. Your risk of developing kidney stones may be greater depending on your diet, your lifestyle, the medicines you take, and whether you have certain medical conditions. Most people can reduce their chances of developing kidney stones by following the instructions below. Depending on your overall health and the type of kidney stones you tend to develop, your dietitian may give you more specific instructions. What are tips for following this plan? Reading food labels  Choose foods with "no salt added" or "low-salt" labels. Limit your sodium intake to less than 1500 mg per day.  Choose foods with calcium for each meal and snack. Try to eat about 300 mg of calcium at each meal. Foods that contain 200-500 mg of calcium per serving include: ? 8 oz (237 ml) of milk, fortified nondairy milk, and fortified fruit juice. ? 8 oz (237 ml) of kefir, yogurt, and soy yogurt. ? 4 oz (118 ml) of tofu. ? 1 oz of cheese. ? 1 cup (300 g) of dried figs. ? 1 cup (91 g) of cooked broccoli. ? 1-3 oz can of sardines or mackerel.  Most people need 1000 to 1500 mg of calcium each day. Talk to your dietitian about how much calcium is recommended for you. Shopping  Buy plenty of fresh fruits and vegetables. Most people do not need to avoid fruits  and vegetables, even if they contain nutrients that may contribute to kidney stones.  When shopping for convenience foods, choose: ? Whole pieces of fruit. ? Premade salads with dressing on the side. ? Low-fat fruit and yogurt smoothies.  Avoid buying frozen meals or prepared deli foods.  Look for foods with live cultures, such as yogurt and kefir. Cooking  Do not add salt to food when cooking. Place a salt shaker on the table and allow each person to add his or her own salt to taste.  Use vegetable protein, such as beans, textured vegetable protein (TVP), or tofu instead of meat  in pasta, casseroles, and soups. Meal planning   Eat less salt, if told by your dietitian. To do this: ? Avoid eating processed or premade food. ? Avoid eating fast food.  Eat less animal protein, including cheese, meat, poultry, or fish, if told by your dietitian. To do this: ? Limit the number of times you have meat, poultry, fish, or cheese each week. Eat a diet free of meat at least 2 days a week. ? Eat only one serving each day of meat, poultry, fish, or seafood. ? When you prepare animal protein, cut pieces into small portion sizes. For most meat and fish, one serving is about the size of one deck of cards.  Eat at least 5 servings of fresh fruits and vegetables each day. To do this: ? Keep fruits and vegetables on hand for snacks. ? Eat 1 piece of fruit or a handful of berries with breakfast. ? Have a salad and fruit at lunch. ? Have two kinds of vegetables at dinner.  Limit foods that are high in a substance called oxalate. These include: ? Spinach. ? Rhubarb. ? Beets. ? Potato chips and french fries. ? Nuts.  If you regularly take a diuretic medicine, make sure to eat at least 1-2 fruits or vegetables high in potassium each day. These include: ? Avocado. ? Banana. ? Orange, prune, carrot, or tomato juice. ? Baked potato. ? Cabbage. ? Beans and split peas. General  instructions   Drink enough fluid to keep your urine clear or pale yellow. This is the most important thing you can do.  Talk to your health care provider and dietitian about taking daily supplements. Depending on your health and the cause of your kidney stones, you may be advised: ? Not to take supplements with vitamin C. ? To take a calcium supplement. ? To take a daily probiotic supplement. ? To take other supplements such as magnesium, fish oil, or vitamin B6.  Take all medicines and supplements as told by your health care provider.  Limit alcohol intake to no more than 1 drink a day for nonpregnant women and 2 drinks a day for men. One drink equals 12 oz of beer, 5 oz of wine, or 1 oz of hard liquor.  Lose weight if told by your health care provider. Work with your dietitian to find strategies and an eating plan that works best for you. What foods are not recommended? Limit your intake of the following foods, or as told by your dietitian. Talk to your dietitian about specific foods you should avoid based on the type of kidney stones and your overall health. Grains Breads. Bagels. Rolls. Baked goods. Salted crackers. Cereal. Pasta. Vegetables Spinach. Rhubarb. Beets. Canned vegetables. Angie Fava. Olives. Meats and other protein foods Nuts. Nut butters. Large portions of meat, poultry, or fish. Salted or cured meats. Deli meats. Hot dogs. Sausages. Dairy Cheese. Beverages Regular soft drinks. Regular vegetable juice. Seasonings and other foods Seasoning blends with salt. Salad dressings. Canned soups. Soy sauce. Ketchup. Barbecue sauce. Canned pasta sauce. Casseroles. Pizza. Lasagna. Frozen meals. Potato chips. [place] fries. Summary  You can reduce your risk of kidney stones by making changes to your diet.  The most important thing you can do is drink enough fluid. You should drink enough fluid to keep your urine clear or pale yellow.  Ask your health care provider or dietitian  how much protein from animal sources you should eat each day, and also how much salt and calcium you should  have each day. This information is not intended to replace advice given to you by your health care provider. Make sure you discuss any questions you have with your health care provider. Document Released: 08/12/2010 Document Revised: 08/07/2018 Document Reviewed: 03/28/2016 Elsevier Patient Education  2020 [place].  Kidney Stones  Kidney stones (urolithiasis) are solid, rock-like deposits that form inside of the organs that make urine (kidneys). A kidney stone may form in a kidney and move into the bladder, where it can cause intense pain and block the flow of urine. Kidney stones are created when high levels of certain minerals are found in the urine. They are usually passed through urination, but in some cases, medical treatment may be needed to remove them. What are the causes? Kidney stones may be caused by:  A condition in which certain glands produce too much parathyroid hormone (primary hyperparathyroidism), which causes too much calcium buildup in the blood.  Buildup of uric acid crystals in the bladder (hyperuricosuria). Uric acid is a chemical that the body produces when you eat certain foods. It usually exits the body in the urine.  Narrowing (stricture) of one or both of the tubes that drain urine from the kidneys to the bladder (ureters).  A kidney blockage that is present at birth (congenital obstruction).  Past surgery on the kidney or the ureters, such as gastric bypass surgery. What increases the risk? The following factors make you more likely to develop kidney stones:  Having had a kidney stone in the past.  Having a family history of kidney stones.  Not drinking enough water.  Eating a diet that is high in protein, salt (sodium), or sugar.  Being overweight or obese. What are the signs or symptoms? Symptoms of a kidney stone may  include:  Nausea.  Vomiting.  Blood in the urine (hematuria).  Pain in the side of the abdomen, right below the ribs (flank pain). Pain usually spreads (radiates) to the groin.  Needing to urinate frequently or urgently. How is this diagnosed? This condition may be diagnosed based on:  Your medical history.  A physical exam.  Blood tests.  Urine tests.  CT scan.  Abdominal X-ray.  A procedure to examine the inside of the bladder (cystoscopy). How is this treated? Treatment for kidney stones depends on the size, location, and makeup of the stones. Treatment may involve:  Analyzing your urine before and after you pass the stone through urination.  Being monitored at the hospital until you pass the stone through urination.  Increasing your fluid intake and decreasing the amount of calcium and protein in your diet.  A procedure to break up kidney stones in the bladder using: ? A focused beam of light (laser therapy). ? Shock waves (extracorporeal shock wave lithotripsy).  Surgery to remove kidney stones. This may be needed if you have severe pain or have stones that block your urinary tract. Follow these instructions at home: Eating and drinking  Drink enough fluid to keep your urine clear or pale yellow. This will help you to pass the kidney stone.  If directed, change your diet. This may include: ? Limiting how much sodium you eat. ? Eating more fruits and vegetables. ? Limiting how much meat, poultry, fish, and eggs you eat.  Follow instructions from your health care provider about eating or drinking restrictions. General instructions  Collect urine samples as told by your health care provider. You may need to collect a urine sample: ? 24 hours after you  pass the stone. ? 8-12 weeks after passing the kidney stone, and every 6-12 months after that.  Strain your urine every time you urinate, for as long as directed. Use the strainer that your health care  provider recommends.  Do not throw out the kidney stone after passing it. Keep the stone so it can be tested by your health care provider. Testing the makeup of your kidney stone may help prevent you from getting kidney stones in the future.  Take over-the-counter and prescription medicines only as told by your health care provider.  Keep all follow-up visits as told by your health care provider. This is important. You may need follow-up X-rays or ultrasounds to make sure that your stone has passed. How is this prevented? To prevent another kidney stone:  Drink enough fluid to keep your urine clear or pale yellow. This is the best way to prevent kidney stones.  Eat a healthy diet and follow recommendations from your health care provider about foods to avoid. You may be instructed to eat a low-protein diet. Recommendations vary depending on the type of kidney stone that you have.  Maintain a healthy weight. Contact a health care provider if:  You have pain that gets worse or does not get better with medicine. Get help right away if:  You have a fever or chills.  You develop severe pain.  You develop new abdominal pain.  You faint.  You are unable to urinate. This information is not intended to replace advice given to you by your health care provider. Make sure you discuss any questions you have with your health care provider. Document Released: 04/17/2005 Document Revised: 11/27/2017 Document Reviewed: 10/01/2015 Elsevier Patient Education  2020 [employer].

## 2019-04-18 NOTE — Progress Notes (Signed)
Virtual Visit via Video Note  I connected with Elizabeth Berger  on 04/18/19 at 11:10 AM EST by a video enabled telemedicine application and verified that I am speaking with the correct person using two identifiers.  Location patient: work Environmental manager or home office Persons participating in the virtual visit: patient, provider  I discussed the limitations of evaluation and management by telemedicine and the availability of in person appointments. The patient expressed understanding and agreed to proceed.   HPI: 1. Annual doing well  2. Kidney stones 06/2018 no further episodes was told by urology to stop tums 3. Migraines imitrex tried and had memory loss and unclear if worked after 2 doses when she took it for a h/a agreeable to try it again no migraine in 2-3 months  Stress reduced and no anxiety 4. GERD taking prilosec 20 mg qd and it is helping    ROS: See pertinent positives and negatives per HPI. General: lost 20 lbs since last visit due to stress not trying  HEENT: nl hearing  CV: no chest pain  Lungs: no sob  GI: no ab pain  GU: no issues no further kidney stones  Psych: reduced stress and anxiety  Skin: no issues Neuro: no migraine in 2-3 months  MSK: no pain in joints  Past Medical History:  Diagnosis Date  . FH: migraines    mom  . Migraines     Past Surgical History:  Procedure Laterality Date  . TONSILLECTOMY      Family History  Problem Relation Age of Onset  . Arthritis Mother   . Depression Mother   . Depression Maternal Grandmother   . Cancer Maternal Grandmother 23       Breast  . Cancer Maternal Grandfather        ? type ?bone  . 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  omeprazole (PRILOSEC) 20 MG capsule, , Disp: , Rfl:  .  butalbital-acetaminophen-caffeine (FIORICET)  50-325-40 MG tablet, Take 1-2 tablets by mouth 2 (two) times daily as needed for headache., Disp: 60 tablet, Rfl: 5 .  imiquimod (ALDARA) 5 % cream, Apply topically 3 (three) times a week. Apply a thin layer 3 times per week  bedtime; leave on- 6 to 10 hours, then wash with mild soap and water. Until total clearance of warts or f max of 16 wks, Disp: 12 each, Rfl: 0 .  nystatin-triamcinolone (MYCOLOG II) cream, Apply 1 application topically 2 (two) times daily., Disp: 30 g, Rfl: 1  EXAM:  VITALS per patient if applicable:  GENERAL: alert, oriented, appears well and in no acute distress  HEENT: atraumatic, conjunttiva clear, no obvious abnormalities on inspection of external nose and ears  NECK: normal movements of the head and neck  LUNGS: on inspection no signs of respiratory distress, breathing rate appears normal, no obvious gross SOB, gasping or wheezing  CV: no obvious cyanosis  MS: moves all visible extremities without noticeable abnormality  PSYCH/NEURO: pleasant and cooperative, no obvious depression or anxiety, speech and thought processing grossly intact  ASSESSMENT AND PLAN:  Discussed the following assessment and plan:  Annual physical exam Flu shot had 02/07/2019  Tdap utd  Consider HPV vaccine if not had Per pt hep B immune  Per pt just had std testing so declines  Pap  OB/GYN 02/27/18 negative   rec smoking cessation light social smoker  rec avoid sun tanning Healthy diet nad exercise   Migraine without status migrainosus, not intractable, unspecified migraine type - Plan: butalbital-acetaminophen-caffeine (FIORICET) 50-325-40 MG tablet Stop imitrex see HPI   History of kidney stones Given info   Gastroesophageal reflux disease prilosec 20 mg qd is helping will send message when ready for refill      -we discussed possible serious and likely etiologies, options for evaluation and workup, limitations of telemedicine visit vs in person visit, treatment,  treatment risks and precautions. Pt prefers to treat via telemedicine empirically rather then risking or undertaking an in person visit at this moment. Patient agrees to seek prompt in person care if worsening, new symptoms arise, or if is not improving with treatment.   I discussed the assessment and treatment plan with the patient. The patient was provided an opportunity to ask questions and all were answered. The patient agreed with the plan and demonstrated an understanding of the instructions.   The patient was advised to call back or seek an in-person evaluation if the symptoms worsen or if the condition fails to improve as anticipated.  Time spent 15 minutes  Delorise Jackson, MD

## 2019-05-13 ENCOUNTER — Other Ambulatory Visit: Payer: Self-pay

## 2019-05-13 MED ORDER — IMIQUIMOD 5 % EX CREA: TOPICAL_CREAM | CUTANEOUS | 0 refills | Status: DC

## 2019-05-13 NOTE — Telephone Encounter (Signed)
Refill sent mychart message also sent

## 2019-06-20 ENCOUNTER — Encounter: Payer: Self-pay | Admitting: Internal Medicine

## 2019-06-24 ENCOUNTER — Encounter (INDEPENDENT_AMBULATORY_CARE_PROVIDER_SITE_OTHER): Payer: No Typology Code available for payment source | Admitting: Internal Medicine

## 2019-06-24 DIAGNOSIS — Z113 Encounter for screening for infections with a predominantly sexual mode of transmission: Secondary | ICD-10-CM | POA: Diagnosis not present

## 2019-06-26 ENCOUNTER — Telehealth: Payer: Self-pay | Admitting: Internal Medicine

## 2019-06-26 NOTE — Telephone Encounter (Signed)
Elizabeth Berger, [name]   BP  06/26/19 3:33 PM Note   Pt called about orders she requested through myChart. She would like a call back when she will be able to schedule labs. Please advise.

## 2019-06-26 NOTE — Telephone Encounter (Signed)
Pt called about orders she requested through myChart. She would like a call back when she will be able to schedule labs. Please advise.

## 2019-06-26 NOTE — Telephone Encounter (Signed)
Message added to patient message encounter for 06/24/19

## 2019-06-26 NOTE — Telephone Encounter (Signed)
If we have lab appt 06/27/19 schedule labs then nonfasting   Thanks [place]

## 2019-06-27 ENCOUNTER — Telehealth: Payer: Self-pay | Admitting: Internal Medicine

## 2019-06-27 NOTE — Telephone Encounter (Signed)
[place] Gammon, [name]  BP  06/26/19 3:33 PM Note   Pt called about orders she requested through myChart. She would like a call back when she will be able to schedule labs. Please advise.           Documentation   Suella Broad, Weyman Croon "[name]"  You 3 days ago   Yes   You  Suella Broad, Laylana Leigh "[name]" 3 days ago  TM Are you agreeable to my chart fee for me ordering all of these tests outside of your appt?  My chart consult?   [place]   Velic, Faiga Leigh "[name]"  You 23 hours ago (6:40 PM)   I would like to do all of them.   You  Suella Broad, Avis Leigh "[name]" Yesterday (3:00 PM)  TM If you would like this, are you agreeable to a my chart fee for consultation and interpretation of labs ?  Do you want all STDS  HIV Herpes  Hepatitis B/C RPR syphilis  Gonorrhea/chlamydia/bacterial vaginosis/trichomonas   Let me know which ones and if you agree to consult fee ?    Babs Bertin, CMA routed conversation to [employer] (8:10 AM)  Suella Broad, Weyman Croon "[name]"  You 4 days ago   I would like to get a routine STD check.    STD check pt agreeable to my chart fee   [place] Time spent 5-10 minutes

## 2019-06-27 NOTE — Telephone Encounter (Signed)
Call pt and schedule labs asap nonfasting std check

## 2019-06-27 NOTE — Telephone Encounter (Signed)
Patient scheduled for 3/01 at 4pm.

## 2019-06-30 ENCOUNTER — Other Ambulatory Visit: Payer: No Typology Code available for payment source

## 2019-07-03 ENCOUNTER — Other Ambulatory Visit: Payer: Self-pay | Admitting: Internal Medicine

## 2019-07-03 ENCOUNTER — Other Ambulatory Visit (INDEPENDENT_AMBULATORY_CARE_PROVIDER_SITE_OTHER): Payer: No Typology Code available for payment source

## 2019-07-03 ENCOUNTER — Other Ambulatory Visit: Payer: Self-pay

## 2019-07-03 ENCOUNTER — Other Ambulatory Visit (HOSPITAL_COMMUNITY)
Admission: RE | Admit: 2019-07-03 | Discharge: 2019-07-03 | Disposition: A | Discharge status: Home / Self Care | Payer: No Typology Code available for payment source | Source: Ambulatory Visit | Attending: Internal Medicine | Admitting: Internal Medicine

## 2019-07-03 DIAGNOSIS — Z1389 Encounter for screening for other disorder: Secondary | ICD-10-CM

## 2019-07-03 DIAGNOSIS — Z0000 Encounter for general adult medical examination without abnormal findings: Secondary | ICD-10-CM | POA: Diagnosis not present

## 2019-07-03 DIAGNOSIS — Z1329 Encounter for screening for other suspected endocrine disorder: Secondary | ICD-10-CM

## 2019-07-03 DIAGNOSIS — Z113 Encounter for screening for infections with a predominantly sexual mode of transmission: Secondary | ICD-10-CM

## 2019-07-03 DIAGNOSIS — Z13220 Encounter for screening for lipoid disorders: Secondary | ICD-10-CM

## 2019-07-03 DIAGNOSIS — N3000 Acute cystitis without hematuria: Secondary | ICD-10-CM

## 2019-07-03 DIAGNOSIS — R739 Hyperglycemia, unspecified: Secondary | ICD-10-CM

## 2019-07-04 ENCOUNTER — Encounter: Payer: Self-pay | Admitting: Internal Medicine

## 2019-07-04 LAB — HEMOGLOBIN A1C: Hgb A1c MFr Bld: 4.6 % (ref 4.6–6.5)

## 2019-07-04 LAB — COMPREHENSIVE METABOLIC PANEL
ALT: 16 U/L (ref 0–35)
AST: 16 U/L (ref 0–37)
Albumin: 4.6 g/dL (ref 3.5–5.2)
Alkaline Phosphatase: 70 U/L (ref 39–117)
BUN: 18 mg/dL (ref 6–23)
CO2: 21 mEq/L (ref 19–32)
Calcium: 9.9 mg/dL (ref 8.4–10.5)
Chloride: 103 mEq/L (ref 96–112)
Creatinine, Ser: 0.71 mg/dL (ref 0.40–1.20)
GFR: 99.29 mL/min (ref >60.00)
Glucose, Bld: 87 mg/dL (ref 70–99)
Potassium: 5.3 mEq/L — ABNORMAL HIGH (ref 3.5–5.1)
Sodium: 135 mEq/L (ref 135–145)
Total Bilirubin: 0.3 mg/dL (ref 0.2–1.2)
Total Protein: 7.9 g/dL (ref 6.0–8.3)

## 2019-07-04 LAB — URINALYSIS, ROUTINE W REFLEX MICROSCOPIC
Bilirubin Urine: NEGATIVE
Glucose, UA: NEGATIVE
Hgb urine dipstick: NEGATIVE
Ketones, ur: NEGATIVE
Leukocytes,Ua: NEGATIVE
Nitrite: NEGATIVE
Protein, ur: NEGATIVE
Specific Gravity, Urine: 1.007 (ref 1.001–1.03)
pH: 6 (ref 5.0–8.0)

## 2019-07-04 LAB — CBC WITH DIFFERENTIAL/PLATELET
Basophils Absolute: 0.1 10*3/uL (ref 0.0–0.1)
Basophils Relative: 0.9 % (ref 0.0–3.0)
Eosinophils Absolute: 0.3 10*3/uL (ref 0.0–0.7)
Eosinophils Relative: 4.7 % (ref 0.0–5.0)
HCT: 43.6 % (ref 36.0–46.0)
Hemoglobin: 15.1 g/dL — ABNORMAL HIGH (ref 12.0–15.0)
Lymphocytes Relative: 27.7 % (ref 12.0–46.0)
Lymphs Abs: 1.6 10*3/uL (ref 0.7–4.0)
MCHC: 34.6 g/dL (ref 30.0–36.0)
MCV: 93.2 fl (ref 78.0–100.0)
Monocytes Absolute: 0.6 10*3/uL (ref 0.1–1.0)
Monocytes Relative: 10.3 % (ref 3.0–12.0)
Neutro Abs: 3.3 10*3/uL (ref 1.4–7.7)
Neutrophils Relative %: 56.4 % (ref 43.0–77.0)
Platelets: 251 10*3/uL (ref 150.0–400.0)
RBC: 4.68 Mil/uL (ref 3.87–5.11)
RDW: 12.5 % (ref 11.5–15.5)
WBC: 5.8 10*3/uL (ref 4.0–10.5)

## 2019-07-04 LAB — URINE CULTURE
MICRO NUMBER:: 10214906
SPECIMEN QUALITY:: ADEQUATE

## 2019-07-04 LAB — HIV ANTIBODY (ROUTINE TESTING W REFLEX): HIV 1&2 Ab, 4th Generation: NONREACTIVE

## 2019-07-04 LAB — HEPATITIS C ANTIBODY
Hepatitis C Ab: NONREACTIVE
SIGNAL TO CUT-OFF: 0.03 (ref <1.00)

## 2019-07-04 LAB — TSH: TSH: 2.33 u[IU]/mL (ref 0.35–4.50)

## 2019-07-04 LAB — HEPATITIS B SURFACE ANTIBODY, QUANTITATIVE: Hep B S AB Quant (Post): >1000 m[IU]/mL (ref >=10)

## 2019-07-04 LAB — T4, FREE: Free T4: 0.94 ng/dL (ref 0.60–1.60)

## 2019-07-04 LAB — RPR: RPR Ser Ql: NONREACTIVE

## 2019-07-04 LAB — HEPATITIS B SURFACE ANTIGEN: Hepatitis B Surface Ag: NONREACTIVE

## 2019-07-05 LAB — HSV(HERPES SIMPLEX VRS) I + II AB-IGM: HSVI/II Comb IgM: 1.79 Ratio — ABNORMAL HIGH (ref 0.00–0.90)

## 2019-07-05 LAB — HSV(HERPES SIMPLEX VRS) I + II AB-IGG
HSV 1 Glycoprotein G Ab, IgG: >62.2 index — ABNORMAL HIGH (ref 0.00–0.90)
HSV 2 IgG, Type Spec: 14.5 index — ABNORMAL HIGH (ref 0.00–0.90)

## 2019-07-06 ENCOUNTER — Encounter: Payer: Self-pay | Admitting: Internal Medicine

## 2019-07-06 ENCOUNTER — Other Ambulatory Visit: Payer: Self-pay | Admitting: Internal Medicine

## 2019-07-06 DIAGNOSIS — B009 Herpesviral infection, unspecified: Secondary | ICD-10-CM

## 2019-07-11 ENCOUNTER — Other Ambulatory Visit: Payer: Self-pay | Admitting: Internal Medicine

## 2019-07-11 DIAGNOSIS — B009 Herpesviral infection, unspecified: Secondary | ICD-10-CM

## 2019-07-11 LAB — URINE CYTOLOGY ANCILLARY ONLY
Bacterial Vaginitis-Urine: NEGATIVE
Chlamydia: NEGATIVE
Comment: NEGATIVE
Comment: NORMAL
Neisseria Gonorrhea: NEGATIVE

## 2019-07-11 MED ORDER — VALACYCLOVIR HCL 1 G PO TABS: 1000.0000 mg | ORAL_TABLET | Freq: Every day | ORAL | 3 refills | Status: DC

## 2019-07-21 ENCOUNTER — Telehealth: Payer: Self-pay | Admitting: Internal Medicine

## 2019-07-21 ENCOUNTER — Other Ambulatory Visit: Payer: No Typology Code available for payment source

## 2019-07-21 NOTE — Telephone Encounter (Signed)
Pt called in and would like labs done at [facility]. Please change labs so she can get them done there. I cancelled appt for today for our labs here.

## 2019-07-21 NOTE — Telephone Encounter (Signed)
Pt had a question about a medication for the blisters on her lip. Pt wasn't sure of the medication name

## 2019-07-21 NOTE — Telephone Encounter (Signed)
Please advise   Did not see orders in the system

## 2019-07-21 NOTE — Telephone Encounter (Signed)
Pt calling back in and was informed of Valtrex being sent in on 07/11/19 to [facility].

## 2019-07-21 NOTE — Telephone Encounter (Signed)
Left message to return call 

## 2019-07-21 NOTE — Telephone Encounter (Signed)
Pt recently had labs done 07/03/19. Patient informed and verbalized understanding.   Is she needing anything else done?

## 2019-07-22 NOTE — Telephone Encounter (Signed)
I am not sure which labs needed she just had labs  I think she does not need labs for now  What is pt talking about?   [place]

## 2019-07-22 NOTE — Telephone Encounter (Signed)
Pt was informed yesterday that she was not needing anymore labs.

## 2019-09-01 ENCOUNTER — Encounter: Payer: Self-pay | Admitting: Certified Nurse Midwife

## 2019-09-01 ENCOUNTER — Ambulatory Visit (INDEPENDENT_AMBULATORY_CARE_PROVIDER_SITE_OTHER): Payer: No Typology Code available for payment source | Admitting: Certified Nurse Midwife

## 2019-09-01 ENCOUNTER — Encounter: Payer: No Typology Code available for payment source | Admitting: Certified Nurse Midwife

## 2019-09-01 ENCOUNTER — Other Ambulatory Visit: Payer: Self-pay

## 2019-09-01 VITALS — BP 112/74 | HR 88 | Ht 62.0 in | Wt 136.4 lb

## 2019-09-01 DIAGNOSIS — B009 Herpesviral infection, unspecified: Secondary | ICD-10-CM | POA: Diagnosis not present

## 2019-09-01 DIAGNOSIS — A64 Unspecified sexually transmitted disease: Secondary | ICD-10-CM | POA: Diagnosis not present

## 2019-09-01 MED ORDER — IMIQUIMOD 5 % EX CREA: TOPICAL_CREAM | CUTANEOUS | 0 refills | Status: DC

## 2019-09-01 NOTE — Patient Instructions (Signed)

## 2019-09-01 NOTE — Progress Notes (Signed)
OB/GYN CONFERENCE NOTE:  Subjective:       Elizabeth Berger is a 29 y.o. [place] female who presents for a conference appointment. 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 She denies active out breaks. Was given perscription for valtrex. Was not informed on use of medication , transmission of HSV, and treatment.    Gynecologic History Patient's last menstrual period was 09/01/2019 (exact date). Contraception: none  Obstetric History OB History  Gravida Para Term Preterm AB Living  0 0 0 0 0 0  SAB TAB Ectopic Multiple Live Births  0 0 0 0 0    Past Medical History:  Diagnosis Date  . FH: migraines    mom  . Herpes    1&2 IgG+  . Migraines     Past Surgical History:  Procedure Laterality Date  . TONSILLECTOMY      Current Outpatient Medications on File Prior to Visit  Medication Sig Dispense Refill  . butalbital-acetaminophen-caffeine (FIORICET) 50-325-40 MG tablet Take 1-2 tablets by mouth 2 (two) times daily as needed for headache. 60 tablet 5  . omeprazole (PRILOSEC) 20 MG capsule     . valACYclovir (VALTREX) 1000 MG tablet Take 1 tablet (1,000 mg total) by mouth daily. 90 tablet 3  . imiquimod (ALDARA) 5 % cream Apply topically 3 (three) times a week. Apply a thin layer 3 times per week  bedtime; leave on- 6 to 10 hours, then wash with mild soap and water. Until total clearance of warts or f max of 16 wks (Patient not taking: Reported on 09/01/2019) 12 each 0  . nystatin-triamcinolone (MYCOLOG II) cream Apply 1 application topically 2 (two) times daily. 30 g 1   No current facility-administered medications on file prior to visit.    No Known Allergies  Social History   Socioeconomic History  . Marital status: Single    Spouse name: Not on file  . Number of children: Not on file  . Years of education: Not on file  . Highest education level: Not on file  Occupational History  . Not  on file  Tobacco Use  . Smoking status: Former Research scientist (life sciences)  . Smokeless tobacco: Never Used  . Tobacco comment: social smoker   Substance and Sexual Activity  . Alcohol use: Yes  . Drug use: Not Currently  . Sexual activity: Yes    Partners: Male    Birth control/protection: None  Other Topics Concern  . Not on file  Social History Narrative       [employer]    HS ed    No kids    Wears seat belt, no guns, safe in relationship    Social Determinants of Health   Financial Resource Strain:   . Difficulty of Paying Living Expenses:   Food Insecurity:   . Worried About Charity fundraiser in the Last Year:   . Arboriculturist in the Last Year:   Transportation Needs:   . Film/video editor (Medical):   Marland Kitchen Lack of Transportation (Non-Medical):   Physical Activity:   . Days of Exercise per Week:   . Minutes of Exercise per Session:   Stress:   . Feeling of Stress :   Social Connections:   . Frequency of Communication with Friends and Family:   . Frequency of Social Gatherings with Friends and Family:   . Attends Religious Services:   .  Active Member of Clubs or Organizations:   . Attends Archivist Meetings:   Marland Kitchen Marital Status:   Intimate Partner Violence:   . Fear of Current or Ex-Partner:   . Emotionally Abused:   Marland Kitchen Physically Abused:   . Sexually Abused:     Family History  Problem Relation Age of Onset  . Arthritis Mother   . Depression Mother   . Depression Maternal Grandmother   . Cancer Maternal Grandmother 110       Breast  . Cancer Maternal Grandfather        ? type ?bone? liver vs kidney  . Hearing loss Maternal Grandfather     The following portions of the patient's history were reviewed and updated as appropriate: allergies, current medications, past family history, past medical history, past social history, past surgical history and problem list.  Review of Systems Pertinent items are noted in HPI.    Objective:   BP 112/74   Pulse 88   Ht 5\' 2"  (1.575 m)   Wt 136 lb 6 oz (61.9 kg)   LMP 09/01/2019 (Exact Date)   BMI 24.94 kg/m    Assessment/Plan:   Patient Active Problem List   Diagnosis Date Noted  . Annual physical exam 04/18/2019  . History of kidney stones 04/18/2019  . Gastroesophageal reflux disease 04/18/2019  . Migraine without status migrainosus, not intractable 09/25/2017  . Family history of breast cancer 09/25/2017  . Family history of ovarian cancer 09/25/2017       Time: 15 in spent reviewing STD, HSV type I & II,  diagnosis-IgG, IgM antibody results. Discussed treatment for out breaks and prevention of spreading STDs.   Refill; pt request refill for aldara cream for HPV warts.orders placed   Return to Clinic:  For annual exam.   Philip Aspen, CNM [facility]

## 2019-12-08 ENCOUNTER — Ambulatory Visit
Admission: EM | Admit: 2019-12-08 | Discharge: 2019-12-08 | Disposition: A | Discharge status: Home / Self Care | Payer: No Typology Code available for payment source | Attending: Emergency Medicine | Admitting: Emergency Medicine

## 2019-12-08 ENCOUNTER — Other Ambulatory Visit: Payer: Self-pay

## 2019-12-08 DIAGNOSIS — B349 Viral infection, unspecified: Secondary | ICD-10-CM | POA: Diagnosis present

## 2019-12-08 LAB — POC SARS CORONAVIRUS 2 AG -  ED: SARS Coronavirus 2 Ag: NEGATIVE

## 2019-12-08 LAB — POCT RAPID STREP A (OFFICE): Rapid Strep A Screen: NEGATIVE

## 2019-12-08 NOTE — ED Provider Notes (Signed)
[name]    CSN: [id number] Arrival date & time: 12/08/19  4098      History   Chief Complaint Chief Complaint  Patient presents with  . Sore Throat  . Otalgia  . Nasal Congestion    HPI Elizabeth Berger is a 29 y.o. female.   Patient presents with 2-day history of sore throat, ear pain, nasal congestion, rhinorrhea.  Treatment attempted at home with OTC pain reliever.  She denies fever, chills, rash, cough, shortness of breath, abdominal pain, vomiting, diarrhea, or other symptoms.  The history is provided by the patient.    Past Medical History:  Diagnosis Date  . FH: migraines    mom  . Herpes    1&2 IgG+  . Migraines     Patient Active Problem List   Diagnosis Date Noted  . Herpes 09/01/2019  . Annual physical exam 04/18/2019  . History of kidney stones 04/18/2019  . Gastroesophageal reflux disease 04/18/2019  . Migraine without status migrainosus, not intractable 09/25/2017  . Family history of breast cancer 09/25/2017  . Family history of ovarian cancer 09/25/2017    Past Surgical History:  Procedure Laterality Date  . TONSILLECTOMY      OB History    Gravida  0   Para  0   Term  0   Preterm  0   AB  0   Living  0     SAB  0   TAB  0   Ectopic  0   Multiple  0   Live Births  0            Home Medications    Prior to Admission medications   Medication Sig Start Date End Date Taking? Authorizing Provider  butalbital-acetaminophen-caffeine (FIORICET) 50-325-40 MG tablet Take 1-2 tablets by mouth 2 (two) times daily as needed for headache. 04/18/19 04/17/20  McLean-Scocuzza, Nino Glow, MD  imiquimod (ALDARA) 5 % cream Apply topically 3 (three) times a week. Apply a thin layer 3 times per week  bedtime; leave on- 6 to 10 hours, then wash with mild soap and water. Until total clearance of warts or f max of 16 wks 09/01/19   Philip Aspen, CNM  nystatin-triamcinolone (MYCOLOG II) cream Apply 1 application topically 2  (two) times daily. 05/22/18   Malachy Mood, MD  omeprazole (PRILOSEC) 20 MG capsule  02/04/19   [provider]  valACYclovir (VALTREX) 1000 MG tablet Take 1 tablet (1,000 mg total) by mouth daily. 07/11/19   McLean-Scocuzza, Nino Glow, MD    Family History Family History  Problem Relation Age of Onset  . Arthritis Mother   . Depression Mother   . Depression Maternal Grandmother   . Cancer Maternal Grandmother 54       Breast  . Cancer Maternal Grandfather        ? type ?bone? liver vs kidney  . Hearing loss Maternal Grandfather     Social History Social History   Tobacco Use  . Smoking status: Former Research scientist (life sciences)  . Smokeless tobacco: Never Used  . Tobacco comment: social smoker   Vaping Use  . Vaping Use: Never used  Substance Use Topics  . Alcohol use: Yes  . Drug use: Not Currently     Allergies   Patient has no known allergies.   Review of Systems Review of Systems  Constitutional: Negative for chills and fever.  HENT: Positive for congestion, ear pain, rhinorrhea and sore throat.   Eyes: Negative for  pain and visual disturbance.  Respiratory: Negative for cough and shortness of breath.   Cardiovascular: Negative for chest pain and palpitations.  Gastrointestinal: Negative for abdominal pain, diarrhea and vomiting.  Genitourinary: Negative for dysuria and hematuria.  Musculoskeletal: Negative for arthralgias and back pain.  Skin: Negative for color change and rash.  Neurological: Negative for seizures and syncope.  All other systems reviewed and are negative.    Physical Exam Triage Vital Signs ED Triage Vitals [12/08/19 0843]  Enc Vitals Group     BP      Pulse      Resp      Temp      Temp src      SpO2      Weight      Height      Head Circumference      Peak Flow      Pain Score 10     Pain Loc      Pain Edu?      Excl. in [place]?    No data found.  Updated Vital Signs BP 121/78   Pulse (!) 112   Temp 99 F (37.2 C)   Resp 17    LMP 11/11/2019 (Exact Date)   SpO2 91%   Visual Acuity Right Eye Distance:   Left Eye Distance:   Bilateral Distance:    Right Eye Near:   Left Eye Near:    Bilateral Near:     Physical Exam Vitals and nursing note reviewed.  Constitutional:      General: She is not in acute distress.    Appearance: She is well-developed. She is ill-appearing.  HENT:     Head: Normocephalic and atraumatic.     Right Ear: Tympanic membrane normal.     Left Ear: Tympanic membrane normal.     Nose: Congestion and rhinorrhea present.     Mouth/Throat:     Mouth: Mucous membranes are moist.     Pharynx: Posterior oropharyngeal erythema present. No oropharyngeal exudate.  Eyes:     Conjunctiva/sclera: Conjunctivae normal.  Cardiovascular:     Rate and Rhythm: Normal rate and regular rhythm.     Heart sounds: No murmur heard.   Pulmonary:     Effort: Pulmonary effort is normal. No respiratory distress.     Breath sounds: Normal breath sounds.  Abdominal:     Palpations: Abdomen is soft.     Tenderness: There is no abdominal tenderness. There is no guarding or rebound.  Musculoskeletal:     Cervical back: Neck supple.  Skin:    General: Skin is warm and dry.     Findings: No rash.  Neurological:     General: No focal deficit present.     Mental Status: She is alert and oriented to person, place, and time.     Gait: Gait normal.  Psychiatric:        Mood and Affect: Mood normal.        Behavior: Behavior normal.      UC Treatments / Results  Labs (all labs ordered are listed, but only abnormal results are displayed) Labs Reviewed  CULTURE, GROUP A STREP ([place])  NOVEL CORONAVIRUS, NAA  POC SARS CORONAVIRUS 2 AG -  ED  POCT RAPID STREP A (OFFICE)    EKG   Radiology No results found.  Procedures Procedures (including critical care time)  Medications Ordered in UC Medications - No data to display  Initial Impression / Assessment and Plan / UC  Course  I have reviewed the  triage vital signs and the nursing notes.  Pertinent labs & imaging results that were available during my care of the patient were reviewed by me and considered in my medical decision making (see chart for details).   Viral illness.  Patient states she will notify employee health when she leaves here that she is symptomatic.  Rapid strep negative; throat culture pending.  POC COVID negative; PCR pending.  Instructed patient to self quarantine until the test result is back and to take Tylenol as needed for fever/discomfort.  Instructed her to follow up with her PCP if her symptoms are not improving.  Patient agrees with plan of care.    Final Clinical Impressions(s) / UC Diagnoses   Final diagnoses:  Viral illness     Discharge Instructions     Your rapid strep test is negative.  A throat culture is pending; we will call you if it is positive requiring treatment.    Your rapid COVID test is negative; the send-out test is pending.  You should self quarantine until your test result is back.    Follow up with your primary care provider if your symptoms are not improving.       ED Prescriptions    None     PDMP not reviewed this encounter.   Sharion Balloon, NP 12/08/19 [phone]

## 2019-12-08 NOTE — Discharge Instructions (Addendum)
Your rapid strep test is negative.  A throat culture is pending; we will call you if it is positive requiring treatment.    Your rapid COVID test is negative; the send-out test is pending.  You should self quarantine until your test result is back.    Follow up with your primary care provider if your symptoms are not improving.

## 2019-12-08 NOTE — ED Triage Notes (Signed)
Patient complains of sore throat, bilateral ear pain, and nasal congestion x2 days. Reports throat pain is 10/10 and reports pain is worse when swallowing. Denies n/v/d, chest pain, cough

## 2019-12-10 ENCOUNTER — Encounter: Payer: Self-pay | Admitting: Internal Medicine

## 2019-12-10 LAB — NOVEL CORONAVIRUS, NAA: SARS-CoV-2, NAA: NOT DETECTED

## 2019-12-10 LAB — SARS-COV-2, NAA 2 DAY TAT

## 2019-12-10 NOTE — Telephone Encounter (Signed)
Pt states that she started having symptoms on Friday night and went and got tested for Covid on Monday. Pt may have been tested too soon. Pt states that sore throat has gotten significantly worse. Please advise

## 2019-12-11 LAB — CULTURE, GROUP A STREP (THRC)

## 2020-02-17 ENCOUNTER — Other Ambulatory Visit: Payer: No Typology Code available for payment source

## 2020-02-19 ENCOUNTER — Ambulatory Visit: Payer: No Typology Code available for payment source | Admitting: Internal Medicine

## 2020-02-19 ENCOUNTER — Telehealth: Payer: Self-pay | Admitting: Internal Medicine

## 2020-02-19 NOTE — Telephone Encounter (Signed)
Patient no-showed today's appointment; appointment was for 10/21 at 3:30, provider notified for review of record. Mychart message sent for patient to re-schedule.

## 2020-02-25 ENCOUNTER — Encounter: Payer: No Typology Code available for payment source | Admitting: Internal Medicine

## 2020-03-02 ENCOUNTER — Other Ambulatory Visit: Payer: Self-pay

## 2020-03-02 ENCOUNTER — Encounter: Payer: Self-pay | Admitting: Certified Nurse Midwife

## 2020-03-02 ENCOUNTER — Ambulatory Visit (INDEPENDENT_AMBULATORY_CARE_PROVIDER_SITE_OTHER): Payer: No Typology Code available for payment source | Admitting: Certified Nurse Midwife

## 2020-03-02 VITALS — BP 109/80 | HR 91 | Ht 62.0 in | Wt 132.4 lb

## 2020-03-02 DIAGNOSIS — Z01411 Encounter for gynecological examination (general) (routine) with abnormal findings: Secondary | ICD-10-CM | POA: Diagnosis not present

## 2020-03-02 DIAGNOSIS — N926 Irregular menstruation, unspecified: Secondary | ICD-10-CM

## 2020-03-02 DIAGNOSIS — Z3169 Encounter for other general counseling and advice on procreation: Secondary | ICD-10-CM

## 2020-03-02 DIAGNOSIS — Z01419 Encounter for gynecological examination (general) (routine) without abnormal findings: Secondary | ICD-10-CM

## 2020-03-02 NOTE — Progress Notes (Signed)
GYNECOLOGY ANNUAL PREVENTATIVE CARE ENCOUNTER NOTE  History:     Elizabeth Berger is a 29 y.o. G0P0000 female here for a routine annual gynecologic exam.  Current complaints: change in menses, periods are only 1-2 days, infertility .   Denies abnormal vaginal bleeding, discharge, pelvic pain, problems with intercourse or other gynecologic concerns.     Social Relationship: in relationship with female partner  Living: with boyfriend and grandmother Work: [employer]  Exercise: once a week Smoke/Alcohol/drug use: occasional alcohol, denies smoking and drug use.   Gynecologic History Patient's last menstrual period was 02/19/2020 (exact date). Contraception: none Last Pap: 02/27/2018 Results were: normal  Last mammogram: n/a    Upstream - 03/02/20 1421      Pregnancy Intention Screening   Does the patient want to become pregnant in the next year? Yes    Does the patient's partner want to become pregnant in the next year? Yes    Would the patient like to discuss contraceptive options today? No      Contraception Wrap Up   Contraception Counseling Provided No          The pregnancy intention screening data noted above was reviewed. Potential methods of contraception were discussed. The patient elected to proceed with No Method - No Contraceptive Precautions.    Obstetric History OB History  Gravida Para Term Preterm AB Living  0 0 0 0 0 0  SAB TAB Ectopic Multiple Live Births  0 0 0 0 0    Past Medical History:  Diagnosis Date  . FH: migraines    mom  . Herpes    1&2 IgG+  . Migraines     Past Surgical History:  Procedure Laterality Date  . TONSILLECTOMY      Current Outpatient Medications on File Prior to Visit  Medication Sig Dispense Refill  . butalbital-acetaminophen-caffeine (FIORICET) 50-325-40 MG tablet Take 1-2 tablets by mouth 2 (two) times daily as needed for headache. 60 tablet 5  . imiquimod (ALDARA) 5 % cream Apply topically 3 (three)  times a week. Apply a thin layer 3 times per week  bedtime; leave on- 6 to 10 hours, then wash with mild soap and water. Until total clearance of warts or f max of 16 wks 12 each 0  . omeprazole (PRILOSEC) 20 MG capsule     . valACYclovir (VALTREX) 1000 MG tablet Take 1 tablet (1,000 mg total) by mouth daily. 90 tablet 3  . nystatin-triamcinolone (MYCOLOG II) cream Apply 1 application topically 2 (two) times daily. 30 g 1   No current facility-administered medications on file prior to visit.    No Known Allergies  Social History:  reports that she has quit smoking. She has never used smokeless tobacco. She reports current alcohol use. She reports previous drug use.  Family History  Problem Relation Age of Onset  . Arthritis Mother   . Depression Mother   . Depression Maternal Grandmother   . Cancer Maternal Grandmother 77       Breast  . Cancer Maternal Grandfather        ? type ?bone? liver vs kidney  . Hearing loss Maternal Grandfather     The following portions of the patient's history were reviewed and updated as appropriate: allergies, current medications, past family history, past medical history, past social history, past surgical history and problem list.  Review of Systems Pertinent items noted in HPI and remainder of comprehensive ROS otherwise negative.  Physical Exam:  BP 109/80   Pulse 91   Ht 5\' 2"  (1.575 m)   Wt 132 lb 6 oz (60 kg)   LMP 02/19/2020 (Exact Date)   BMI 24.21 kg/m  CONSTITUTIONAL: Well-developed, well-nourished female in no acute distress.  HENT:  Normocephalic, atraumatic, External right and left ear normal. Oropharynx is clear and moist EYES: Conjunctivae and EOM are normal. Pupils are equal, round, and reactive to light. No scleral icterus.  NECK: Normal range of motion, supple, no masses.  Normal thyroid.  SKIN: Skin is warm and dry. No rash noted. Not diaphoretic. No erythema. No pallor. MUSCULOSKELETAL: Normal range of motion. No  tenderness.  No cyanosis, clubbing, or edema.  2+ distal pulses. NEUROLOGIC: Alert and oriented to person, place, and time. Normal reflexes, muscle tone coordination.  PSYCHIATRIC: Normal mood and affect. Normal behavior. Normal judgment and thought content. CARDIOVASCULAR: Normal heart rate noted, regular rhythm RESPIRATORY: Clear to auscultation bilaterally. Effort and breath sounds normal, no problems with respiration noted. BREASTS: Symmetric in size. No masses, tenderness, skin changes, nipple drainage, or lymphadenopathy bilaterally.  Left breast grape size mass at 3 o clock, mobile , non tender . "has been there" no change.  ABDOMEN: Soft, no distention noted.  No tenderness, rebound or guarding.  PELVIC: Normal appearing external genitalia and urethral meatus; normal appearing vaginal mucosa and cervix.  No abnormal discharge noted.  Pap smear not indicated.  Normal uterine size, no other palpable masses, no uterine or adnexal tenderness.  .   Assessment and Plan:    1. Infertility counseling - Thyroid Panel With TSH - Prolactin - FSH/LH - US PELVIC COMPLETE WITH TRANSVAGINAL; Future  2. Abnormal menses - Thyroid Panel With TSH - Prolactin - FSH/LH - US PELVIC COMPLETE WITH TRANSVAGINAL; Future  Pap:not indicated Mammogram : Labs: repeat infertility labs due to change in cycle. U/s ordered to evaluate for ovulation. Discussed use of clomid or Femara to induce ovulation when she is ready to conceive.  Refills: none Referral: Routine preventative health maintenance measures emphasized. Please refer to After Visit Summary for other counseling recommendations.      Philip Aspen, CNM [facility]

## 2020-03-02 NOTE — Patient Instructions (Signed)
Female Infertility  Female infertility refers to a woman's inability to get pregnant (conceive) after a year of having sex regularly (or after 6 months in women over age 27) without using birth control. Infertility can also mean that a woman is not able to carry a pregnancy to full term. Both women and men can have fertility problems. What are the causes? This condition may be caused by:  Problems with reproductive organs. Infertility can result if a woman: ? Has an abnormally short cervix or a cervix that does not remain closed during a pregnancy. ? Has a blockage or scarring in the fallopian tubes. ? Has an abnormally shaped uterus. ? Has uterine fibroids. This is a benign mass of tissue or muscle (tumor) that can develop in the uterus. ? Is not ovulating in a regular way.  Certain medical conditions. These may include: ? Polycystic ovary syndrome (PCOS). This is a hormonal disorder that can cause small cysts to grow on the ovaries. This is the most common cause of infertility in women. ? Endometriosis. This is a condition in which the tissue that lines the uterus (endometrium) grows outside of its normal location. ? Cancer and cancer treatments, such as chemotherapy or radiation. ? Premature ovarian failure. This is when ovaries stop producing eggs and hormones before age 38. ? Sexually transmitted diseases, such as chlamydia or gonorrhea. ? Autoimmune disorders. These are disorders in which the body's defense system (immune system) attacks normal, healthy cells. Infertility can be linked to more than one cause. For some women, the cause of infertility is not known (unexplained infertility). What increases the risk?  Age. A woman's fertility declines with age, especially after her mid-12s.  Being underweight or overweight.  Drinking too much alcohol.  Using drugs such as anabolic steroids, cocaine, and marijuana.  Exercising excessively.  Being exposed to environmental toxins,  such as radiation, pesticides, and certain chemicals. What are the signs or symptoms? The main sign of infertility in women is the inability to get pregnant or carry a pregnancy to full term. How is this diagnosed? This condition may be diagnosed by:  Checking whether you are ovulating each month. The tests may include: ? Blood tests to check hormone levels. ? An ultrasound of the ovaries. ? Taking a small tissue that lines the uterus and checking it under a microscope (endometrial biopsy).  Doing additional tests. This is done if ovulation is normal. Tests may include: ? Hysterosalpingography. This X-ray test can show the shape of the uterus and whether the fallopian tubes are open. ? Laparoscopy. This test uses a lighted tube (laparoscope) to look for problems in the fallopian tubes and other organs. ? Transvaginal ultrasound. This imaging test is used to check for abnormalities in the uterus and ovaries. ? Hysteroscopy. This test uses a lighted tube to check for problems in the cervix and the uterus. To be diagnosed with infertility, both partners will have a physical exam. Both partners will also have an extensive medical and sexual history taken. Additional tests may be done. How is this treated? Treatment depends on the cause of infertility. Most cases of infertility in women are treated with medicine or surgery.  Women may take medicine to: ? Correct ovulation problems. ? Treat other health conditions.  Surgery may be done to: ? Repair damage to the ovaries, fallopian tubes, cervix, or uterus. ? Remove growths from the uterus. ? Remove scar tissue from the uterus, pelvis, or other organs. Assisted reproductive technology (ART) Assisted reproductive technology (  ART) refers to all treatments and procedures that combine eggs and sperm outside the body to try to help a couple conceive. ART is often combined with fertility drugs to stimulate ovulation. Sometimes ART is done using eggs  retrieved from another woman's body (donor eggs) or from previously frozen fertilized eggs (embryos). There are different types of ART. These include:  Intrauterine insemination (IUI). A long, thin tube is used to place sperm directly into a woman's uterus. This procedure: ? Is effective for infertility caused by sperm problems, including low sperm count and low motility. ? Can be used in combination with fertility drugs.  In vitro fertilization (IVF). This is done when a woman's fallopian tubes are blocked or when a man has low sperm count. In this procedure: ? Fertility drugs are used to stimulate the ovaries to produce multiple eggs. ? Once mature, these eggs are removed from the body and combined with the sperm to be fertilized. ? The fertilized eggs are then placed into the woman's uterus. Follow these instructions at home:  Take over-the-counter and prescription medicines only as told by your health care provider.  Do not use any products that contain nicotine or tobacco, such as cigarettes and e-cigarettes. If you need help quitting, ask your health care provider.  If you drink alcohol, limit how much you have to 1 drink a day.  Make dietary changes to lose weight or maintain a healthy weight. Work with your health care provider and a dietitian to set a weight-loss goal that is healthy and reasonable for you.  Seek support from a counselor or support group to talk about your concerns related to infertility. Couples counseling may be helpful for you and your partner.  Practice stress reduction techniques that work well for you, such as regular physical activity, meditation, or deep breathing.  Keep all follow-up visits as told by your health care provider. This is important. Contact a health care provider if you:  Feel that stress is interfering with your life and relationships.  Have side effects from treatments for infertility. Summary  Female infertility refers to a woman's  inability to get pregnant (conceive) after a year of having sex regularly (or after 6 months in women over age 62) without using birth control.  To be diagnosed with infertility, both partners will have a physical exam. Both partners will also have an extensive medical and sexual history taken.  Seek support from a counselor or support group to talk about your concerns related to infertility. Couples counseling may be helpful for you and your partner. This information is not intended to replace advice given to you by your health care provider. Make sure you discuss any questions you have with your health care provider. Document Revised: 08/08/2018 Document Reviewed: 03/19/2017 Elsevier Patient Education  [place].

## 2020-03-03 LAB — THYROID PANEL WITH TSH
Free Thyroxine Index: 2.4 (ref 1.2–4.9)
T3 Uptake Ratio: 29 % (ref 24–39)
T4, Total: 8.4 ug/dL (ref 4.5–12.0)
TSH: 1.92 u[IU]/mL (ref 0.450–4.500)

## 2020-03-03 LAB — FSH/LH
FSH: 5.1 m[IU]/mL
LH: 10.9 m[IU]/mL

## 2020-03-03 LAB — PROLACTIN: Prolactin: 20.6 ng/mL (ref 4.8–23.3)

## 2020-04-29 ENCOUNTER — Other Ambulatory Visit: Payer: No Typology Code available for payment source

## 2020-06-08 ENCOUNTER — Other Ambulatory Visit: Payer: Self-pay | Admitting: Certified Nurse Midwife

## 2020-06-08 ENCOUNTER — Other Ambulatory Visit (HOSPITAL_COMMUNITY): Payer: Self-pay | Admitting: Certified Nurse Midwife

## 2020-06-08 MED ORDER — IMIQUIMOD 5 % EX CREA: TOPICAL_CREAM | CUTANEOUS | 0 refills | Status: DC

## 2020-10-11 ENCOUNTER — Ambulatory Visit
Admission: RE | Admit: 2020-10-11 | Discharge: 2020-10-11 | Disposition: A | Discharge status: Home / Self Care | Payer: No Typology Code available for payment source | Source: Ambulatory Visit | Attending: Certified Nurse Midwife | Admitting: Certified Nurse Midwife

## 2020-10-11 ENCOUNTER — Other Ambulatory Visit: Payer: Self-pay

## 2020-10-11 ENCOUNTER — Ambulatory Visit: Payer: No Typology Code available for payment source

## 2020-10-11 DIAGNOSIS — N926 Irregular menstruation, unspecified: Secondary | ICD-10-CM | POA: Insufficient documentation

## 2020-10-11 DIAGNOSIS — Z3169 Encounter for other general counseling and advice on procreation: Secondary | ICD-10-CM | POA: Insufficient documentation

## 2020-10-25 ENCOUNTER — Encounter: Payer: Self-pay | Admitting: Certified Nurse Midwife

## 2020-10-25 ENCOUNTER — Other Ambulatory Visit: Payer: Self-pay

## 2020-10-25 ENCOUNTER — Ambulatory Visit (INDEPENDENT_AMBULATORY_CARE_PROVIDER_SITE_OTHER): Payer: No Typology Code available for payment source | Admitting: Certified Nurse Midwife

## 2020-10-25 VITALS — BP 112/78 | HR 97 | Ht 62.0 in | Wt 128.3 lb

## 2020-10-25 DIAGNOSIS — Z3200 Encounter for pregnancy test, result unknown: Secondary | ICD-10-CM

## 2020-10-25 LAB — POCT URINE PREGNANCY: Preg Test, Ur: POSITIVE — AB

## 2020-10-25 NOTE — Patient Instructions (Signed)
https://www.[facility].org/womens-health/faqs/prenatal-genetic-screening-tests">  Prenatal Care Prenatal care is health care during pregnancy. It helps you and your unborn baby (fetus) stay as healthy as possible. Prenatal care may be provided by a midwife, a family practice doctor, a IT consultant (nurse practitioner or physician assistant), or a childbirth and pregnancy doctor (obstetrician). How does this affect me? During pregnancy, you will be closely monitored for any new conditions that might develop. To lower your risk of pregnancy complications, you and yourhealth care provider will talk about any underlying conditions you have. How does this affect my baby? Early and consistent prenatal care increases the chance that your baby will be healthy during pregnancy. Prenatal care lowers the risk that your baby will be: Born early (prematurely). Smaller than expected at birth (small for gestational age). What can I expect at the first prenatal care visit? Your first prenatal care visit will likely be the longest. You should schedule your first prenatal care visit as soon as you know that you are pregnant. Your first visit is a good time to talk about any questions or concerns you haveabout pregnancy. Medical history At your visit, you and your health care provider will talk about your medical history, including: Any past pregnancies. Your family's medical history. Medical history of the baby's father. Any long-term (chronic) health conditions you have and how you manage them. Any surgeries or procedures you have had. Any current over-the-counter or prescription medicines, herbs, or supplements that you are taking. Other factors that could pose a risk to your baby, including: Exposure to harmful chemicals or radiation at work or at home. Any substance use, including tobacco, alcohol, and drug use. Your home setting and your stress levels, including: Exposure to abuse or  violence. Household financial strain. Your daily health habits, including diet and exercise. Tests and screenings Your health care provider will: Measure your weight, height, and blood pressure. Do a physical exam, including a pelvic and breast exam. Perform blood tests and urine tests to check for: Urinary tract infection. Sexually transmitted infections (STIs). Low iron levels in your blood (anemia). Blood type and certain proteins on red blood cells (Rh antibodies). Infections and immunity to viruses, such as hepatitis B and rubella. HIV (human immunodeficiency virus). Discuss your options for genetic screening. Tips about staying healthy Your health care provider will also give you information about how to keep yourself and your baby healthy, including: Nutrition and taking vitamins. Physical activity. How to manage pregnancy symptoms such as nausea and vomiting (morning sickness). Infections and substances that may be harmful to your baby and how to avoid them. Food safety. Dental care. Working. Travel. Warning signs to watch for and when to call your health care provider. How often will I have prenatal care visits? After your first prenatal care visit, you will have regular visits throughout your pregnancy. The visit schedule is often as follows: Up to week 28 of pregnancy: once every 4 weeks. 28-36 weeks: once every 2 weeks. After 36 weeks: every week until delivery. Some women may have visits more or less often depending on any underlyinghealth conditions and the health of the baby. Keep all follow-up and prenatal care visits. This is important. What happens during routine prenatal care visits? Your health care provider will: Measure your weight and blood pressure. Check for fetal heart sounds. Measure the height of your uterus in your abdomen (fundal height). This may be measured starting around week 20 of pregnancy. Check the position of your baby inside your  uterus. Ask questions  about your diet, sleeping patterns, and whether you can feel the baby move. Review warning signs to watch for and signs of labor. Ask about any pregnancy symptoms you are having and how you are dealing with them. Symptoms may include: Headaches. Nausea and vomiting. Vaginal discharge. Swelling. Fatigue. Constipation. Changes in your vision. Feeling persistently sad or anxious. Any discomfort, including back or pelvic pain. Bleeding or spotting. Make a list of questions to ask your health care provider at your routinevisits. What tests might I have during prenatal care visits? You may have blood, urine, and imaging tests throughout your pregnancy, such as: Urine tests to check for glucose, protein, or signs of infection. Glucose tests to check for a form of diabetes that can develop during pregnancy (gestational diabetes mellitus). This is usually done around week 24 of pregnancy. Ultrasounds to check your baby's growth and development, to check for birth defects, and to check your baby's well-being. These can also help to decide when you should deliver your baby. A test to check for group B strep (GBS) infection. This is usually done around week 36 of pregnancy. Genetic testing. This may include blood, fluid, or tissue sampling, or imaging tests, such as an ultrasound. Some genetic tests are done during the first trimester and some are done during the second trimester. What else can I expect during prenatal care visits? Your health care provider may recommend getting certain vaccines during pregnancy. These may include: A yearly flu shot (annual influenza vaccine). This is especially important if you will be pregnant during flu season. Tdap (tetanus, diphtheria, pertussis) vaccine. Getting this vaccine during pregnancy can protect your baby from whooping cough (pertussis) after birth. This vaccine may be recommended between weeks 27 and 36 of pregnancy. A COVID-19  vaccine. Later in your pregnancy, your health care provider may give you information about: Childbirth and breastfeeding classes. Choosing a health care provider for your baby. Umbilical cord banking. Breastfeeding. Birth control after your baby is born. The hospital labor and delivery unit and how to set up a tour. Registering at the hospital before you go into labor. Where to find more information Office on Women's Health: LegalWarrants.gl American Pregnancy Association: americanpregnancy.org March of Dimes: marchofdimes.org Summary Prenatal care helps you and your baby stay as healthy as possible during pregnancy. Your first prenatal care visit will most likely be the longest. You will have visits and tests throughout your pregnancy to monitor your health and your baby's health. Bring a list of questions to your visits to ask your health care provider. Make sure to keep all follow-up and prenatal care visits. This information is not intended to replace advice given to you by your health care provider. Make sure you discuss any questions you have with your healthcare provider. Document Revised: 01/29/2020 Document Reviewed: 01/29/2020 Elsevier Patient Education  [place].

## 2020-10-25 NOTE — Progress Notes (Addendum)
Subjective:    Elizabeth Berger is a 30 y.o. female who presents for evaluation of amenorrhea. She believes she could be pregnant. Pregnancy is desired. Sexual Activity: single partner, contraception: none. Current symptoms also include: fatigue. Last period was normal.    The following portions of the patient's history were reviewed and updated as appropriate: allergies, current medications, past family history, past medical history, past social history, past surgical history, and problem list.  Review of Systems Pertinent items are noted in HPI.     Objective:    BP 112/78   Pulse 97   Ht 5\' 2"  (1.575 m)   Wt 128 lb 4.8 oz (58.2 kg)   LMP 02/19/2020 (Exact Date)   BMI 23.47 kg/m  General: alert, cooperative, appears stated age, and no acute distress    Lab Review Urine HCG: positive    Assessment:    Absence of menstruation.     Plan:    Pregnancy Test: Positive: EDC: 06/09/21. Briefly discussed pre-natal care options. Md and midwifery care reviewed.. Encouraged well-balanced diet, plenty of rest when needed, pre-natal vitamins daily and walking for exercise. Discussed self-help for nausea, avoiding OTC medications until consulting provider or pharmacist, other than Tylenol as needed, minimal caffeine (1-2 cups daily) and avoiding alcohol. She will schedule u/s for dating, Nurse visit in 3 wks and  her initial OB 5 wks . Feel free to call with any questions.   Philip Aspen, CNM

## 2020-11-02 ENCOUNTER — Ambulatory Visit
Admission: RE | Admit: 2020-11-02 | Discharge: 2020-11-02 | Disposition: A | Discharge status: Home / Self Care | Payer: No Typology Code available for payment source | Source: Ambulatory Visit | Attending: Certified Nurse Midwife | Admitting: Certified Nurse Midwife

## 2020-11-02 ENCOUNTER — Other Ambulatory Visit: Payer: Self-pay

## 2020-11-02 DIAGNOSIS — Z3200 Encounter for pregnancy test, result unknown: Secondary | ICD-10-CM | POA: Insufficient documentation

## 2020-11-08 ENCOUNTER — Other Ambulatory Visit: Payer: Self-pay

## 2020-11-08 MED ORDER — DOXYLAMINE-PYRIDOXINE 10-10 MG PO TBEC
1.0000 | DELAYED_RELEASE_TABLET | Freq: Four times a day (QID) | ORAL | 5 refills | Status: DC
  Filled 2020-11-08: qty 120, 30d supply, fill #0

## 2020-11-16 ENCOUNTER — Ambulatory Visit: Payer: No Typology Code available for payment source

## 2020-11-29 ENCOUNTER — Ambulatory Visit: Payer: No Typology Code available for payment source | Admitting: Certified Nurse Midwife

## 2020-11-29 ENCOUNTER — Encounter: Payer: No Typology Code available for payment source | Admitting: Certified Nurse Midwife

## 2020-11-29 ENCOUNTER — Other Ambulatory Visit: Payer: Self-pay

## 2020-11-29 VITALS — BP 115/81 | HR 108 | Resp 16 | Ht 62.0 in | Wt 127.6 lb

## 2020-11-29 DIAGNOSIS — Z3A10 10 weeks gestation of pregnancy: Secondary | ICD-10-CM

## 2020-11-29 DIAGNOSIS — Z3401 Encounter for supervision of normal first pregnancy, first trimester: Secondary | ICD-10-CM

## 2020-11-29 DIAGNOSIS — O219 Vomiting of pregnancy, unspecified: Secondary | ICD-10-CM

## 2020-11-29 DIAGNOSIS — Z113 Encounter for screening for infections with a predominantly sexual mode of transmission: Secondary | ICD-10-CM

## 2020-11-29 MED ORDER — PROMETHAZINE HCL 25 MG PO TABS
25.0000 mg | ORAL_TABLET | Freq: Four times a day (QID) | ORAL | 1 refills | Status: DC | PRN
  Filled 2020-11-29: qty 30, 8d supply, fill #0

## 2020-11-29 NOTE — Progress Notes (Signed)
Elizabeth Berger presents for NOB nurse interview visit. Pregnancy confirmation done 10/25/2020.  G1P0000. Pregnancy education material explained and given. Two cats in the home, she has no contact with the litter box.. NOB labs ordered. HIV labs and Drug screen were explained optional and she did not decline. Drug screen ordered. PNV encouraged. Genetic screening options discussed. Genetic testing: Ordered.  Pt may discuss with provider.  Financial policy reviewed and signed, she is going to try and sign up for Medicaid as well. FMLA from reviewed and signed. Pt. To follow up with provider in 2 weeks for NOB physical. She is having morning sickness, she has tried Unisom, ginger and vitamin B6 and symptoms have not resolved. She also reports that she has been having nausea, vomiting and shaking after intercourse. There will be a surprise reveal. They want results to be revealed to her partner [name] via telephone. All questions answered.

## 2020-11-29 NOTE — Patient Instructions (Signed)
[facility]  October 11, 2016  Wednesday 7:00p - 9:00p  [place], [place]  November 15, 2016  Wednesday [place], [place]    December 20, 2016   Wednesday [place], [place]  January 17, 2017  Wednesday  [place], [place]  February 14, 2017 Wednesday [place], [place]  Interested in a waterbirth?  This informational class will help you discover whether waterbirth is the right fit for you.  Education about waterbirth itself, supplies you would need and how to assemble your support team is what you can expect from this class.  Some obstetrical practices require this class in order to pursue a waterbirth.  (Not all obstetrical practices offer waterbirth check with your healthcare provider)  Register only the expectant mom, but you are encouraged to bring your partner to class!  Fees & Payment No fee  Register Online [email or web address] Search Waterbirth [email or web address]">  First Trimester of Pregnancy  The first trimester of pregnancy starts on the first day of your last menstrual period until the end of week 12. This is also called months 1 through 3 ofpregnancy. Body changes during your first trimester Your body goes through many changes during pregnancy. The changes usuallyreturn to normal after your baby is born. Physical changes You may gain or lose weight. Your breasts may grow larger and hurt. The area around your nipples may get darker. Dark spots or blotches may develop on your face. You may have changes in your hair. Health changes You may feel like you might vomit (nauseous), and you may vomit. You may have heartburn. You may have headaches. You may have trouble pooping (constipation). Your gums may bleed. Other  changes You may get tired easily. You may pee (urinate) more often. Your menstrual periods will stop. You may not feel hungry. You may want to eat certain kinds of food. You may have changes in your emotions from day to day. You may have more dreams. Follow these instructions at home: Medicines Take over-the-counter and prescription medicines only as told by your doctor. Some medicines are not safe during pregnancy. Take a prenatal vitamin that contains at least 600 micrograms (mcg) of folic acid. Eating and drinking Eat healthy meals that include: Fresh fruits and vegetables. Whole grains. Good sources of protein, such as meat, eggs, or tofu. Low-fat dairy products. Avoid raw meat and unpasteurized juice, milk, and cheese. If you feel like you may vomit, or you vomit: Eat 4 or 5 small meals a day instead of 3 large meals. Try eating a few soda crackers. Drink liquids between meals instead of during meals. You may need to take these actions to prevent or treat trouble pooping: Drink enough fluids to keep your pee (urine) pale yellow. Eat foods that are high in fiber. These include beans, whole grains, and fresh fruits and vegetables. Limit foods that are high in fat and sugar. These include fried or sweet foods. Activity Exercise only as told by your doctor. Most people can do their usual exercise routine during pregnancy. Stop exercising if you have cramps or pain in your lower belly (abdomen) or low back. Do not exercise if it is too hot or too humid, or if you are in a place of great height (high altitude). Avoid heavy lifting. If you choose to, you may have sex unless your doctor tells you not  to. Relieving pain and discomfort Wear a good support bra if your breasts are sore. Rest with your legs raised (elevated) if you have leg cramps or low back pain. If you have bulging veins (varicose veins) in your legs: Wear support hose as told by your doctor. Raise your feet for 15  minutes, 3-4 times a day. Limit salt in your food. Safety Wear your seat belt at all times when you are in a car. Talk with your doctor if someone is hurting you or yelling at you. Talk with your doctor if you are feeling sad or have thoughts of hurting yourself. Lifestyle Do not use hot tubs, steam rooms, or saunas. Do not douche. Do not use tampons or scented sanitary pads. Do not use herbal medicines, illegal drugs, or medicines that are not approved by your doctor. Do not drink alcohol. Do not smoke or use any products that contain nicotine or tobacco. If you need help quitting, ask your doctor. Avoid cat litter boxes and soil that is used by cats. These carry germs that can cause harm to the baby and can cause a loss of your baby by miscarriage or stillbirth. General instructions Keep all follow-up visits. This is important. Ask for help if you need counseling or if you need help with nutrition. Your doctor can give you advice or tell you where to go for help. Visit your dentist. At home, brush your teeth with a soft toothbrush. Floss gently. Write down your questions. Take them to your prenatal visits. Where to find more information American Pregnancy Association: americanpregnancy.org [employer]necologists: www.acog.org Office on Women's Health: KeywordPortfolios.com.br Contact a doctor if: You are dizzy. You have a fever. You have mild cramps or pressure in your lower belly. You have a nagging pain in your belly area. You continue to feel like you may vomit, you vomit, or you have watery poop (diarrhea) for 24 hours or longer. You have a bad-smelling fluid coming from your vagina. You have pain when you pee. You are exposed to a disease that spreads from person to person, such as chickenpox, measles, Zika virus, HIV, or hepatitis. Get help right away if: You have spotting or bleeding from your vagina. You have very bad belly cramping or  pain. You have shortness of breath or chest pain. You have any kind of injury, such as from a fall or a car crash. You have new or increased pain, swelling, or redness in an arm or leg. Summary The first trimester of pregnancy starts on the first day of your last menstrual period until the end of week 12 (months 1 through 3). Eat 4 or 5 small meals a day instead of 3 large meals. Do not smoke or use any products that contain nicotine or tobacco. If you need help quitting, ask your doctor. Keep all follow-up visits. This information is not intended to replace advice given to you by your health care provider. Make sure you discuss any questions you have with your healthcare provider. Document Revised: 09/24/2019 Document Reviewed: 07/31/2019 Elsevier Patient Education  [place]. Morning Sickness  Morning sickness is when you feel like you may vomit (feel nauseous) during pregnancy. Sometimes, you may vomit. Morning sickness most often happens in the morning, but it can also happen at any time of the day. Some women may have morning sickness that makes them vomit all the time. This is amore serious problem that needs treatment. What are the causes? The cause of  this condition is not known. What increases the risk? You had vomiting or a feeling like you may vomit before your pregnancy. You had morning sickness in another pregnancy. You are pregnant with more than one baby, such as twins. What are the signs or symptoms? Feeling like you may vomit. Vomiting. How is this treated? Treatment is usually not needed for this condition. You may only need to change what you eat. In some cases, your doctor may give you some things to take for your condition. These include: Vitamin B6 supplements. Medicines to treat the feeling that you may vomit. Ginger. Follow these instructions at home: Medicines Take over-the-counter and prescription medicines only as told by your doctor. Do not take any  medicines until you talk with your doctor about them first. Take multivitamins before you get pregnant. These can stop or lessen the symptoms of morning sickness. Eating and drinking Eat dry toast or crackers before getting out of bed. Eat 5 or 6 small meals a day. Eat dry and bland foods like rice and baked potatoes. Do not eat greasy, fatty, or spicy foods. Have someone cook for you if the smell of food causes you to vomit or to feel like you may vomit. If you feel like you may vomit after taking prenatal vitamins, take them at night or with a snack. Eat protein foods when you need a snack. Nuts, yogurt, and cheese are good choices. Drink fluids throughout the day. Try ginger ale made with real ginger, ginger tea made from fresh grated ginger, or ginger candies. General instructions Do not smoke or use any products that contain nicotine or tobacco. If you need help quitting, ask your doctor. Use an air purifier to keep the air in your house free of smells. Get lots of fresh air. Try to avoid smells that make you feel sick. Try wearing an acupressure wristband. This is a wristband that is used to treat seasickness. Try a treatment called acupuncture. In this treatment, a doctor puts needles into certain areas of your body to make you feel better. Contact a doctor if: You need medicine to feel better. You feel dizzy or light-headed. You are losing weight. Get help right away if: The feeling that you may vomit will not go away, or you cannot stop vomiting. You faint. You have very bad pain in your belly. Summary Morning sickness is when you feel like you may vomit (feel nauseous) during pregnancy. You may feel sick in the morning, but you can feel this way at any time of the day. Making some changes to what you eat may help your symptoms go away. This information is not intended to replace advice given to you by your health care provider. Make sure you discuss any questions you have  with your healthcare provider. Document Revised: 12/01/2019 Document Reviewed: 11/10/2019 Elsevier Patient Education  2022 [place]. Commonly Asked Questions During Pregnancy  Cats: A parasite can be excreted in cat feces.  To avoid exposure you need to have another person empty the little box.  If you must empty the litter box you will need to wear gloves.  Wash your hands after handling your cat.  This parasite can also be found in raw or undercooked meat so this should also be avoided.  Colds, Sore Throats, Flu: Please check your medication sheet to see what you can take for symptoms.  If your symptoms are unrelieved by these medications please call the office.  Dental Work: Most any dental work  your dentist recommends is permitted.  X-rays should only be taken during the first trimester if absolutely necessary.  Your abdomen should be shielded with a lead apron during all x-rays.  Please notify your provider prior to receiving any x-rays.  Novocaine is fine; gas is not recommended.  If your dentist requires a note from [place] prior to dental work please call the office and we will provide one for you.  Exercise: Exercise is an important part of staying healthy during your pregnancy.  You may continue most exercises you were accustomed to prior to pregnancy.  Later in your pregnancy you will most likely notice you have difficulty with activities requiring balance like riding a bicycle.  It is important that you listen to your body and avoid activities that put you at a higher risk of falling.  Adequate rest and staying well hydrated are a must!  If you have questions about the safety of specific activities ask your provider.    Exposure to Children with illness: Try to avoid obvious exposure; report any symptoms to [place] when noted,  If you have chicken pos, red measles or mumps, you should be immune to these diseases.   Please do not take any vaccines while pregnant unless you have checked with your  OB provider.  Fetal Movement: After 28 weeks we recommend you do "kick counts" twice daily.  Lie or sit down in a calm quiet environment and count your baby movements "kicks".  You should feel your baby at least 10 times per hour.  If you have not felt 10 kicks within the first hour get up, walk around and have something sweet to eat or drink then repeat for an additional hour.  If count remains less than 10 per hour notify your provider.  Fumigating: Follow your pest control agent's advice as to how long to stay out of your home.  Ventilate the area well before re-entering.  Hemorrhoids:   Most over-the-counter preparations can be used during pregnancy.  Check your medication to see what is safe to use.  It is important to use a stool softener or fiber in your diet and to drink lots of liquids.  If hemorrhoids seem to be getting worse please call the office.   Hot Tubs:  Hot tubs Jacuzzis and saunas are not recommended while pregnant.  These increase your internal body temperature and should be avoided.  Intercourse:  Sexual intercourse is safe during pregnancy as long as you are comfortable, unless otherwise advised by your provider.  Spotting may occur after intercourse; report any bright red bleeding that is heavier than spotting.  Labor:  If you know that you are in labor, please go to the hospital.  If you are unsure, please call the office and let us help you decide what to do.  Lifting, straining, etc:  If your job requires heavy lifting or straining please check with your provider for any limitations.  Generally, you should not lift items heavier than that you can lift simply with your hands and arms (no back muscles)  Painting:  Paint fumes do not harm your pregnancy, but may make you ill and should be avoided if possible.  Latex or water based paints have less odor than oils.  Use adequate ventilation while painting.  Permanents & Hair Color:  Chemicals in hair dyes are not recommended  as they cause increase hair dryness which can increase hair loss during pregnancy.  " Highlighting" and permanents are allowed.  Dye  may be absorbed differently and permanents may not hold as well during pregnancy.  Sunbathing:  Use a sunscreen, as skin burns easily during pregnancy.  Drink plenty of fluids; avoid over heating.  Tanning Beds:  Because their possible side effects are still unknown, tanning beds are not recommended.  Ultrasound Scans:  Routine ultrasounds are performed at approximately 20 weeks.  You will be able to see your baby's general anatomy an if you would like to know the gender this can usually be determined as well.  If it is questionable when you conceived you may also receive an ultrasound early in your pregnancy for dating purposes.  Otherwise ultrasound exams are not routinely performed unless there is a medical necessity.  Although you can request a scan we ask that you pay for it when conducted because insurance does not cover " patient request" scans.  Work: If your pregnancy proceeds without complications you may work until your due date, unless your physician or employer advises otherwise.  Round Ligament Pain/Pelvic Discomfort:  Sharp, shooting pains not associated with bleeding are fairly common, usually occurring in the second trimester of pregnancy.  They tend to be worse when standing up or when you remain standing for long periods of time.  These are the result of pressure of certain pelvic ligaments called "round ligaments".  Rest, Tylenol and heat seem to be the most effective relief.  As the womb and fetus grow, they rise out of the pelvis and the discomfort improves.  Please notify the office if your pain seems different than that described.  It may represent a more serious condition.

## 2020-11-30 ENCOUNTER — Other Ambulatory Visit: Payer: Self-pay | Admitting: Certified Nurse Midwife

## 2020-12-01 LAB — URINALYSIS, ROUTINE W REFLEX MICROSCOPIC
Bilirubin, UA: NEGATIVE
Glucose, UA: NEGATIVE
Ketones, UA: NEGATIVE
Nitrite, UA: NEGATIVE
Protein,UA: NEGATIVE
RBC, UA: NEGATIVE
Specific Gravity, UA: 1.008 (ref 1.005–1.030)
Urobilinogen, Ur: 0.2 mg/dL (ref 0.2–1.0)
pH, UA: 6.5 (ref 5.0–7.5)

## 2020-12-01 LAB — PARVOVIRUS B19 ANTIBODY, IGG AND IGM
Parvovirus B19 IgG: 5.1 index — ABNORMAL HIGH (ref 0.0–0.8)
Parvovirus B19 IgM: 0.2 index (ref 0.0–0.8)

## 2020-12-01 LAB — MICROSCOPIC EXAMINATION: RBC, Urine: NONE SEEN /hpf (ref 0–2)

## 2020-12-01 LAB — ABO AND RH: Rh Factor: POSITIVE

## 2020-12-01 LAB — HCV INTERPRETATION

## 2020-12-01 LAB — ANTIBODY SCREEN: Antibody Screen: NEGATIVE

## 2020-12-01 LAB — NICOTINE SCREEN, URINE: Cotinine Ql Scrn, Ur: NEGATIVE ng/mL

## 2020-12-01 LAB — RPR: RPR Ser Ql: NONREACTIVE

## 2020-12-01 LAB — HIV ANTIBODY (ROUTINE TESTING W REFLEX): HIV Screen 4th Generation wRfx: NONREACTIVE

## 2020-12-01 LAB — HGB SOLU + RFLX FRAC: Sickle Solubility Test - HGBRFX: NEGATIVE

## 2020-12-01 LAB — RUBELLA SCREEN: Rubella Antibodies, IGG: 1.22 index (ref >0.99)

## 2020-12-01 LAB — VIRAL HEPATITIS HBV, HCV
HCV Ab: <0.1 s/co ratio (ref 0.0–0.9)
Hep B Core Total Ab: NEGATIVE
Hep B Surface Ab, Qual: REACTIVE
Hepatitis B Surface Ag: NEGATIVE

## 2020-12-01 LAB — VARICELLA ZOSTER ANTIBODY, IGG: Varicella zoster IgG: <135 index — ABNORMAL LOW (ref >165)

## 2020-12-02 LAB — GC/CHLAMYDIA PROBE AMP
Chlamydia trachomatis, NAA: NEGATIVE
Neisseria Gonorrhoeae by PCR: NEGATIVE

## 2020-12-07 ENCOUNTER — Other Ambulatory Visit: Payer: Self-pay | Admitting: Certified Nurse Midwife

## 2020-12-07 LAB — URINE CULTURE

## 2020-12-07 LAB — URINE CULTURE, OB REFLEX

## 2020-12-07 LAB — CULTURE, OB URINE

## 2020-12-07 MED ORDER — NITROFURANTOIN MONOHYD MACRO 100 MG PO CAPS
100.0000 mg | ORAL_CAPSULE | Freq: Two times a day (BID) | ORAL | 0 refills | Status: AC
Start: 2020-12-07 — End: 2020-12-16
  Filled 2020-12-07: qty 14, 7d supply, fill #0

## 2020-12-07 NOTE — Progress Notes (Signed)
Urine culture positive for UTI, orders placed for treatment.   Philip Aspen, CNM

## 2020-12-08 ENCOUNTER — Other Ambulatory Visit: Payer: Self-pay

## 2020-12-13 ENCOUNTER — Other Ambulatory Visit: Payer: Self-pay

## 2020-12-13 ENCOUNTER — Encounter: Payer: Self-pay | Admitting: Certified Nurse Midwife

## 2020-12-13 ENCOUNTER — Ambulatory Visit (INDEPENDENT_AMBULATORY_CARE_PROVIDER_SITE_OTHER): Payer: No Typology Code available for payment source | Admitting: Certified Nurse Midwife

## 2020-12-13 ENCOUNTER — Other Ambulatory Visit (HOSPITAL_COMMUNITY)
Admission: RE | Admit: 2020-12-13 | Discharge: 2020-12-13 | Disposition: A | Discharge status: Home / Self Care | Payer: No Typology Code available for payment source | Source: Ambulatory Visit | Attending: Certified Nurse Midwife | Admitting: Certified Nurse Midwife

## 2020-12-13 VITALS — BP 119/81 | HR 103 | Wt 120.7 lb

## 2020-12-13 DIAGNOSIS — Z3401 Encounter for supervision of normal first pregnancy, first trimester: Secondary | ICD-10-CM | POA: Diagnosis not present

## 2020-12-13 DIAGNOSIS — Z124 Encounter for screening for malignant neoplasm of cervix: Secondary | ICD-10-CM | POA: Diagnosis not present

## 2020-12-13 DIAGNOSIS — Z3A12 12 weeks gestation of pregnancy: Secondary | ICD-10-CM

## 2020-12-13 LAB — POCT URINALYSIS DIPSTICK OB
Bilirubin, UA: NEGATIVE
Blood, UA: NEGATIVE
Glucose, UA: NEGATIVE
Ketones, UA: NEGATIVE
Leukocytes, UA: NEGATIVE
Nitrite, UA: NEGATIVE
POC,PROTEIN,UA: NEGATIVE
Spec Grav, UA: 1.015 (ref 1.010–1.025)
Urobilinogen, UA: 0.2 E.U./dL
pH, UA: 7.5 (ref 5.0–8.0)

## 2020-12-13 MED ORDER — ONDANSETRON 4 MG PO TBDP
4.0000 mg | ORAL_TABLET | Freq: Three times a day (TID) | ORAL | 0 refills | Status: DC | PRN
Start: 2020-12-13 — End: 2021-07-02
  Filled 2020-12-13: qty 20, 7d supply, fill #0

## 2020-12-13 NOTE — Patient Instructions (Signed)
https://www.acog.org/womens-health/faqs/prenatal-genetic-screening-tests">  Prenatal Care Prenatal care is health care during pregnancy. It helps you and your unborn baby (fetus) stay as healthy as possible. Prenatal care may be provided by a midwife, a family practice doctor, a IT consultant (nurse practitioner or physician assistant), or a childbirth and pregnancy doctor (obstetrician). How does this affect me? During pregnancy, you will be closely monitored for any new conditions that might develop. To lower your risk of pregnancy complications, you and yourhealth care provider will talk about any underlying conditions you have. How does this affect my baby? Early and consistent prenatal care increases the chance that your baby will be healthy during pregnancy. Prenatal care lowers the risk that your baby will be: Born early (prematurely). Smaller than expected at birth (small for gestational age). What can I expect at the first prenatal care visit? Your first prenatal care visit will likely be the longest. You should schedule your first prenatal care visit as soon as you know that you are pregnant. Your first visit is a good time to talk about any questions or concerns you haveabout pregnancy. Medical history At your visit, you and your health care provider will talk about your medical history, including: Any past pregnancies. Your family's medical history. Medical history of the baby's father. Any long-term (chronic) health conditions you have and how you manage them. Any surgeries or procedures you have had. Any current over-the-counter or prescription medicines, herbs, or supplements that you are taking. Other factors that could pose a risk to your baby, including: Exposure to harmful chemicals or radiation at work or at home. Any substance use, including tobacco, alcohol, and drug use. Your home setting and your stress levels, including: Exposure to abuse or  violence. Household financial strain. Your daily health habits, including diet and exercise. Tests and screenings Your health care provider will: Measure your weight, height, and blood pressure. Do a physical exam, including a pelvic and breast exam. Perform blood tests and urine tests to check for: Urinary tract infection. Sexually transmitted infections (STIs). Low iron levels in your blood (anemia). Blood type and certain proteins on red blood cells (Rh antibodies). Infections and immunity to viruses, such as hepatitis B and rubella. HIV (human immunodeficiency virus). Discuss your options for genetic screening. Tips about staying healthy Your health care provider will also give you information about how to keep yourself and your baby healthy, including: Nutrition and taking vitamins. Physical activity. How to manage pregnancy symptoms such as nausea and vomiting (morning sickness). Infections and substances that may be harmful to your baby and how to avoid them. Food safety. Dental care. Working. Travel. Warning signs to watch for and when to call your health care provider. How often will I have prenatal care visits? After your first prenatal care visit, you will have regular visits throughout your pregnancy. The visit schedule is often as follows: Up to week 28 of pregnancy: once every 4 weeks. 28-36 weeks: once every 2 weeks. After 36 weeks: every week until delivery. Some women may have visits more or less often depending on any underlyinghealth conditions and the health of the baby. Keep all follow-up and prenatal care visits. This is important. What happens during routine prenatal care visits? Your health care provider will: Measure your weight and blood pressure. Check for fetal heart sounds. Measure the height of your uterus in your abdomen (fundal height). This may be measured starting around week 20 of pregnancy. Check the position of your baby inside your  uterus. Ask questions  about your diet, sleeping patterns, and whether you can feel the baby move. Review warning signs to watch for and signs of labor. Ask about any pregnancy symptoms you are having and how you are dealing with them. Symptoms may include: Headaches. Nausea and vomiting. Vaginal discharge. Swelling. Fatigue. Constipation. Changes in your vision. Feeling persistently sad or anxious. Any discomfort, including back or pelvic pain. Bleeding or spotting. Make a list of questions to ask your health care provider at your routinevisits. What tests might I have during prenatal care visits? You may have blood, urine, and imaging tests throughout your pregnancy, such as: Urine tests to check for glucose, protein, or signs of infection. Glucose tests to check for a form of diabetes that can develop during pregnancy (gestational diabetes mellitus). This is usually done around week 24 of pregnancy. Ultrasounds to check your baby's growth and development, to check for birth defects, and to check your baby's well-being. These can also help to decide when you should deliver your baby. A test to check for group B strep (GBS) infection. This is usually done around week 36 of pregnancy. Genetic testing. This may include blood, fluid, or tissue sampling, or imaging tests, such as an ultrasound. Some genetic tests are done during the first trimester and some are done during the second trimester. What else can I expect during prenatal care visits? Your health care provider may recommend getting certain vaccines during pregnancy. These may include: A yearly flu shot (annual influenza vaccine). This is especially important if you will be pregnant during flu season. Tdap (tetanus, diphtheria, pertussis) vaccine. Getting this vaccine during pregnancy can protect your baby from whooping cough (pertussis) after birth. This vaccine may be recommended between weeks 27 and 36 of pregnancy. A COVID-19  vaccine. Later in your pregnancy, your health care provider may give you information about: Childbirth and breastfeeding classes. Choosing a health care provider for your baby. Umbilical cord banking. Breastfeeding. Birth control after your baby is born. The hospital labor and delivery unit and how to set up a tour. Registering at the hospital before you go into labor. Where to find more information Office on Women's Health: LegalWarrants.gl American Pregnancy Association: americanpregnancy.org March of Dimes: marchofdimes.org Summary Prenatal care helps you and your baby stay as healthy as possible during pregnancy. Your first prenatal care visit will most likely be the longest. You will have visits and tests throughout your pregnancy to monitor your health and your baby's health. Bring a list of questions to your visits to ask your health care provider. Make sure to keep all follow-up and prenatal care visits. This information is not intended to replace advice given to you by your health care provider. Make sure you discuss any questions you have with your healthcare provider. Document Revised: 01/29/2020 Document Reviewed: 01/29/2020 Elsevier Patient Education  Elizabeth Berger.

## 2020-12-13 NOTE — Progress Notes (Signed)
NEW OB HISTORY AND PHYSICAL  SUBJECTIVE:       Elizabeth Berger is a 30 y.o. G26P0000 female, Patient's last menstrual period was 09/03/2020 (exact date)., Estimated Date of Delivery: 06/23/21, [redacted]w[redacted]d presents today for establishment of Prenatal Care. She has no unusual complaints.   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    Gynecologic History Patient's last menstrual period was 09/03/2020 (exact date). Normal Contraception: none Last Pap: 02/27/2018. Results were: normal  Obstetric History OB History  Gravida Para Term Preterm AB Living  1 0 0 0 0 0  SAB IAB Ectopic Multiple Live Births  0 0 0 0 0    # Outcome Date GA Lbr Len/2nd Weight Sex Delivery Anes PTL Lv  1 Current             Past Medical History:  Diagnosis Date   FH: migraines    mom   Herpes    1&2 IgG+   Migraines     Past Surgical History:  Procedure Laterality Date   TONSILLECTOMY      Current Outpatient Medications on File Prior to Visit  Medication Sig Dispense Refill   imiquimod (ALDARA) 5 % cream Apply topically 3 (three) times a week. Apply a thin layer 3 times per week  bedtime; leave on- 6 to 10 hours, then wash with mild soap and water. Until total clearance of warts or f max of 16 wks 12 each 0   imiquimod (ALDARA) 5 % cream APPLY THIN LAYER 3 TIMES PER WEEK AT BEDTIME;LEAVE ON FOR 6- 10 HOURS,THEN WASH WITH MILD SOAP & WATER. USE UNTIL TOTAL CLEARANCE OR MAX 16 WEEKS. 12 each 0   nitrofurantoin, macrocrystal-monohydrate, (MACROBID) 100 MG capsule Take 1 capsule (100 mg total) by mouth 2 (two) times daily for 7 days. 14 capsule 0   omeprazole (PRILOSEC) 20 MG capsule      promethazine (PHENERGAN) 25 MG tablet Take 1 tablet (25 mg total) by mouth every 6 (six) hours as needed for nausea or vomiting. 30 tablet 1   valACYclovir (VALTREX) 1000 MG tablet Take 1 tablet (1,000 mg total) by mouth daily. 90 tablet 3    No current facility-administered medications on file prior to visit.    No Known Allergies  Social History   Socioeconomic History   Marital status: Single    Spouse name: Not on file   Number of children: Not on file   Years of education: Not on file   Highest education level: Not on file  Occupational History   Not on file  Tobacco Use   Smoking status: Former   Smokeless tobacco: Never   Tobacco comments:    social smoker   Vaping Use   Vaping Use: Never used  Substance and Sexual Activity   Alcohol use: Not Currently   Drug use: Not Currently   Sexual activity: Yes    Partners: Male    Birth control/protection: None  Other Topics Concern   Not on file  Social History Narrative       [employer] [place]    HS ed    No kids    Wears seat belt, no guns, safe in relationship    Social Determinants of Health   Financial Resource Strain: Not on file  Food Insecurity: Not on file  Transportation Needs: Not on file  Physical Activity: Not on file  Stress: Not on  file  Social Connections: Not on file  Intimate Partner Violence: Not on file    Family History  Problem Relation Age of Onset   Arthritis Mother    Depression Mother    Depression Maternal Grandmother    Cancer Maternal Grandmother 66       Breast   Cancer Maternal Grandfather        ? type ?bone? liver vs kidney   Hearing loss Maternal Grandfather     The following portions of the patient's history were reviewed and updated as appropriate: allergies, current medications, past OB history, past medical history, past surgical history, past family history, past social history, and problem list.    OBJECTIVE: Initial Physical Exam (New OB)  GENERAL APPEARANCE: alert, well appearing, in no apparent distress, oriented to person, place and time HEAD: normocephalic, atraumatic MOUTH: mucous membranes moist, pharynx normal without lesions THYROID: no thyromegaly or  masses present BREASTS: no masses noted, no significant tenderness, no palpable axillary nodes, no skin changes LUNGS: clear to auscultation, no wheezes, rales or rhonchi, symmetric air entry HEART: regular rate and rhythm, no murmurs ABDOMEN: soft, nontender, nondistended, no abnormal masses, no epigastric pain and FHT present EXTREMITIES: no redness or tenderness in the calves or thighs, no edema, no limitation in range of motion, intact peripheral pulses SKIN: normal coloration and turgor, no rashes LYMPH NODES: no adenopathy palpable NEUROLOGIC: alert, oriented, normal speech, no focal findings or movement disorder noted  PELVIC EXAM EXTERNAL GENITALIA: normal appearing vulva with no masses, tenderness or lesions VAGINA: no abnormal discharge or lesions CERVIX: no lesions or cervical motion tenderness, pap collected, contact bleeding  UTERUS: gravid ADNEXA: no masses palpable and nontender OB EXAM PELVIMETRY: appears adequate RECTUM: exam not indicated  ASSESSMENT: Normal pregnancy  PLAN: Prenatal care See orders New OB counseling: The patient has been given an overview regarding routine prenatal care. Recommendations regarding diet, weight gain, and exercise in pregnancy were given. Prenatal testing, optional genetic testing, carrier screening, and ultrasound use in pregnancy were reviewed.  Maternit 21 today Benefits of Breast Feeding were discussed. The patient is encouraged to consider nursing her baby post partum.   Philip Aspen, CNM

## 2020-12-14 LAB — CBC
Hematocrit: 39.4 % (ref 34.0–46.6)
Hemoglobin: 13.4 g/dL (ref 11.1–15.9)
MCH: 31 pg (ref 26.6–33.0)
MCHC: 34 g/dL (ref 31.5–35.7)
MCV: 91 fL (ref 79–97)
Platelets: 276 10*3/uL (ref 150–450)
RBC: 4.32 x10E6/uL (ref 3.77–5.28)
RDW: 11.9 % (ref 11.7–15.4)
WBC: 8.7 10*3/uL (ref 3.4–10.8)

## 2020-12-16 LAB — CYTOLOGY - PAP: Diagnosis: NEGATIVE

## 2020-12-19 LAB — MATERNIT 21 PLUS CORE, BLOOD
Fetal Fraction: 12
Result (T21): NEGATIVE
Trisomy 13 (Patau syndrome): NEGATIVE
Trisomy 18 (Edwards syndrome): NEGATIVE
Trisomy 21 (Down syndrome): NEGATIVE

## 2020-12-20 LAB — SPECIMEN STATUS REPORT

## 2021-01-11 ENCOUNTER — Other Ambulatory Visit: Payer: Self-pay

## 2021-01-11 ENCOUNTER — Encounter: Payer: Self-pay | Admitting: Certified Nurse Midwife

## 2021-01-11 ENCOUNTER — Encounter: Payer: No Typology Code available for payment source | Admitting: Certified Nurse Midwife

## 2021-01-11 ENCOUNTER — Ambulatory Visit (INDEPENDENT_AMBULATORY_CARE_PROVIDER_SITE_OTHER): Payer: No Typology Code available for payment source | Admitting: Certified Nurse Midwife

## 2021-01-11 VITALS — BP 89/62 | HR 112 | Wt 126.4 lb

## 2021-01-11 DIAGNOSIS — Z3A16 16 weeks gestation of pregnancy: Secondary | ICD-10-CM

## 2021-01-11 DIAGNOSIS — Z23 Encounter for immunization: Secondary | ICD-10-CM

## 2021-01-11 DIAGNOSIS — Z3402 Encounter for supervision of normal first pregnancy, second trimester: Secondary | ICD-10-CM

## 2021-01-11 DIAGNOSIS — Z87440 Personal history of urinary (tract) infections: Secondary | ICD-10-CM

## 2021-01-11 LAB — POCT URINALYSIS DIPSTICK OB
Bilirubin, UA: NEGATIVE
Blood, UA: NEGATIVE
Glucose, UA: NEGATIVE
Nitrite, UA: NEGATIVE
Odor: NEGATIVE
POC,PROTEIN,UA: NEGATIVE
Spec Grav, UA: 1.02 (ref 1.010–1.025)
Urobilinogen, UA: 0.2 E.U./dL
pH, UA: 6 (ref 5.0–8.0)

## 2021-01-11 MED ORDER — OMEPRAZOLE 20 MG PO CPDR
20.0000 mg | DELAYED_RELEASE_CAPSULE | Freq: Every day | ORAL | 3 refills | Status: DC
  Filled 2021-01-11: qty 30, 30d supply, fill #0
  Filled 2021-11-04: qty 30, 30d supply, fill #1
  Filled 2021-12-06 – 2022-01-04 (×2): qty 30, 30d supply, fill #2

## 2021-01-11 NOTE — Progress Notes (Signed)
ROB doing well. IS feeling some gas bubbles not sure of fetal movement yet. Discussed Anatomy u/s at 20 wks. Pt states she has it scheduled. Discussed round ligament pain and self help measures. All questions answered. Follow up 4 wk or prn.   Philip Aspen, CNM

## 2021-01-11 NOTE — Patient Instructions (Signed)
Round Ligament Pain The round ligaments are a pair of cord-like tissues that help support the uterus. They can become a source of pain during pregnancy as the ligaments soften and stretch as the baby grows. The pain usually begins in the second trimester (13-28 weeks) of pregnancy, and should only last for a few seconds when it occurs. However, the pain can come and go until the baby is delivered. The pain does not cause harm to the baby. Round ligament pain is usually a short, sharp, and pinching pain, but it can also be a dull, lingering, and aching pain. The pain is felt in the lower side of the abdomen or in the groin. It usually starts deep in the groin and moves up to the outside of the hip area. The pain may happen when you: Suddenly change position, such as quickly going from a sitting to standing position. Do physical activity. Cough or sneeze. Follow these instructions at home: Managing pain  When the pain starts, relax. Then, try any of these methods to help with the pain: Sit down. Flex your knees up to your abdomen. Lie on your side with one pillow under your abdomen and another pillow between your legs. Sit in a warm bath for 15-20 minutes or until the pain goes away. General instructions Watch your condition for any changes. Move slowly when you sit down or stand up. Stop or reduce your physical activities if they cause pain. Avoid long walks if they cause pain. Take over-the-counter and prescription medicines only as told by your health care provider. Keep all follow-up visits. This is important. Contact a health care provider if: Your pain does not go away with treatment. You feel pain in your back that you did not have before. Your medicine is not helping. You have a fever or chills. You have nausea or vomiting. You have diarrhea. You have pain when you urinate. Get help right away if: You have pain that is a rhythmic, cramping pain similar to labor pains. Labor pains  are usually 2 minutes apart, last for about 1 minute, and involve a bearing down feeling or pressure in your pelvis. You have vaginal bleeding. These symptoms may represent a serious problem that is an emergency. Do not wait to see if the symptoms will go away. Get medical help right away. Call your local emergency services (911 in the [place] Do not drive yourself to the hospital. Summary Round ligament pain is felt in the lower abdomen or groin. This pain usually begins in the second trimester (13-28 weeks) and should only last for a few seconds when it occurs. You may notice the pain when you suddenly change position, when you cough or sneeze, or during physical activity. Relaxing, flexing your knees to your abdomen, lying on one side, or taking a warm bath may help to get rid of the pain. Contact your health care provider if the pain does not go away. This information is not intended to replace advice given to you by your health care provider. Make sure you discuss any questions you have with your health care provider. Document Revised: 06/30/2020 Document Reviewed: 06/30/2020 Elsevier Patient Education  [place].

## 2021-01-15 LAB — URINE CULTURE

## 2021-01-31 ENCOUNTER — Ambulatory Visit: Payer: No Typology Code available for payment source

## 2021-02-07 ENCOUNTER — Encounter: Payer: Self-pay | Admitting: Obstetrics and Gynecology

## 2021-02-07 ENCOUNTER — Inpatient Hospital Stay: Payer: No Typology Code available for payment source

## 2021-02-07 ENCOUNTER — Ambulatory Visit (INDEPENDENT_AMBULATORY_CARE_PROVIDER_SITE_OTHER): Payer: No Typology Code available for payment source | Admitting: Certified Nurse Midwife

## 2021-02-07 ENCOUNTER — Ambulatory Visit (INDEPENDENT_AMBULATORY_CARE_PROVIDER_SITE_OTHER): Payer: No Typology Code available for payment source

## 2021-02-07 ENCOUNTER — Other Ambulatory Visit: Payer: Self-pay

## 2021-02-07 ENCOUNTER — Inpatient Hospital Stay
Admission: EM | Admit: 2021-02-07 | Discharge: 2021-02-07 | Disposition: A | Discharge status: Home / Self Care | Payer: No Typology Code available for payment source | Attending: Certified Nurse Midwife | Admitting: Certified Nurse Midwife

## 2021-02-07 VITALS — BP 112/76 | HR 98 | Wt 132.1 lb

## 2021-02-07 DIAGNOSIS — O26832 Pregnancy related renal disease, second trimester: Secondary | ICD-10-CM

## 2021-02-07 DIAGNOSIS — N1330 Unspecified hydronephrosis: Secondary | ICD-10-CM | POA: Diagnosis not present

## 2021-02-07 DIAGNOSIS — N132 Hydronephrosis with renal and ureteral calculous obstruction: Secondary | ICD-10-CM | POA: Insufficient documentation

## 2021-02-07 DIAGNOSIS — Z3401 Encounter for supervision of normal first pregnancy, first trimester: Secondary | ICD-10-CM | POA: Diagnosis not present

## 2021-02-07 DIAGNOSIS — O99891 Other specified diseases and conditions complicating pregnancy: Secondary | ICD-10-CM | POA: Diagnosis not present

## 2021-02-07 DIAGNOSIS — R109 Unspecified abdominal pain: Secondary | ICD-10-CM

## 2021-02-07 DIAGNOSIS — Z3A20 20 weeks gestation of pregnancy: Secondary | ICD-10-CM | POA: Insufficient documentation

## 2021-02-07 DIAGNOSIS — M549 Dorsalgia, unspecified: Secondary | ICD-10-CM | POA: Diagnosis present

## 2021-02-07 DIAGNOSIS — Z87442 Personal history of urinary calculi: Secondary | ICD-10-CM | POA: Diagnosis not present

## 2021-02-07 DIAGNOSIS — N200 Calculus of kidney: Secondary | ICD-10-CM

## 2021-02-07 DIAGNOSIS — Z3402 Encounter for supervision of normal first pregnancy, second trimester: Secondary | ICD-10-CM

## 2021-02-07 LAB — URINALYSIS, COMPLETE (UACMP) WITH MICROSCOPIC
Bilirubin Urine: NEGATIVE
Glucose, UA: NEGATIVE mg/dL
Ketones, ur: 20 mg/dL — AB
Leukocytes,Ua: NEGATIVE
Nitrite: NEGATIVE
Protein, ur: NEGATIVE mg/dL
Specific Gravity, Urine: 1.017 (ref 1.005–1.030)
pH: 5 (ref 5.0–8.0)

## 2021-02-07 LAB — POCT URINALYSIS DIPSTICK OB
Bilirubin, UA: NEGATIVE
Blood, UA: NEGATIVE
Glucose, UA: NEGATIVE
Ketones, UA: NEGATIVE
Leukocytes, UA: NEGATIVE
Nitrite, UA: NEGATIVE
POC,PROTEIN,UA: NEGATIVE
Spec Grav, UA: 1.015 (ref 1.010–1.025)
Urobilinogen, UA: 0.2 E.U./dL
pH, UA: 6.5 (ref 5.0–8.0)

## 2021-02-07 MED ORDER — OXYCODONE HCL 5 MG PO TABS
15.0000 mg | ORAL_TABLET | Freq: Four times a day (QID) | ORAL | Status: DC | PRN
  Administered 2021-02-07: 15 mg via ORAL
  Filled 2021-02-07: qty 3

## 2021-02-07 MED ORDER — CEFTRIAXONE SODIUM 1 G IJ SOLR
1.0000 g | Freq: Once | INTRAMUSCULAR | Status: AC
  Administered 2021-02-07: 1 g via INTRAMUSCULAR
  Filled 2021-02-07: qty 10

## 2021-02-07 MED ORDER — ONDANSETRON 4 MG PO TBDP
4.0000 mg | ORAL_TABLET | Freq: Once | ORAL | Status: AC
  Administered 2021-02-07: 4 mg via ORAL

## 2021-02-07 MED ORDER — ONDANSETRON 4 MG PO TBDP
ORAL_TABLET | ORAL | Status: AC
  Filled 2021-02-07: qty 1

## 2021-02-07 MED ORDER — OXYCODONE-ACETAMINOPHEN 7.5-325 MG PO TABS
2.0000 | ORAL_TABLET | Freq: Four times a day (QID) | ORAL | Status: DC | PRN
  Filled 2021-02-07: qty 2

## 2021-02-07 MED ORDER — OXYCODONE-ACETAMINOPHEN 5-325 MG PO TABS
1.0000 | ORAL_TABLET | Freq: Four times a day (QID) | ORAL | 0 refills | Status: DC | PRN
  Filled 2021-02-07: qty 20, 3d supply, fill #0

## 2021-02-07 MED ORDER — MORPHINE SULFATE (PF) 4 MG/ML IV SOLN
4.0000 mg | Freq: Once | INTRAVENOUS | Status: AC
  Administered 2021-02-07: 4 mg via INTRAMUSCULAR
  Filled 2021-02-07: qty 1

## 2021-02-07 MED ORDER — LIDOCAINE HCL (PF) 1 % IJ SOLN
INTRAMUSCULAR | Status: AC
  Filled 2021-02-07: qty 30

## 2021-02-07 MED ORDER — ACETAMINOPHEN 325 MG PO TABS
650.0000 mg | ORAL_TABLET | Freq: Four times a day (QID) | ORAL | Status: DC | PRN
  Administered 2021-02-07: 650 mg via ORAL
  Filled 2021-02-07: qty 2

## 2021-02-07 NOTE — Patient Instructions (Signed)
Round Ligament Pain The round ligaments are a pair of cord-like tissues that help support the uterus. They can become a source of pain during pregnancy as the ligaments soften and stretch as the baby grows. The pain usually begins in the second trimester (13-28 weeks) of pregnancy, and should only last for a few seconds when it occurs. However, the pain can come and go until the baby is delivered. The pain does not cause harm to the baby. Round ligament pain is usually a short, sharp, and pinching pain, but it can also be a dull, lingering, and aching pain. The pain is felt in the lower side of the abdomen or in the groin. It usually starts deep in the groin and moves up to the outside of the hip area. The pain may happen when you: Suddenly change position, such as quickly going from a sitting to standing position. Do physical activity. Cough or sneeze. Follow these instructions at home: Managing pain  When the pain starts, relax. Then, try any of these methods to help with the pain: Sit down. Flex your knees up to your abdomen. Lie on your side with one pillow under your abdomen and another pillow between your legs. Sit in a warm bath for 15-20 minutes or until the pain goes away. General instructions Watch your condition for any changes. Move slowly when you sit down or stand up. Stop or reduce your physical activities if they cause pain. Avoid long walks if they cause pain. Take over-the-counter and prescription medicines only as told by your health care provider. Keep all follow-up visits. This is important. Contact a health care provider if: Your pain does not go away with treatment. You feel pain in your back that you did not have before. Your medicine is not helping. You have a fever or chills. You have nausea or vomiting. You have diarrhea. You have pain when you urinate. Get help right away if: You have pain that is a rhythmic, cramping pain similar to labor pains. Labor pains  are usually 2 minutes apart, last for about 1 minute, and involve a bearing down feeling or pressure in your pelvis. You have vaginal bleeding. These symptoms may represent a serious problem that is an emergency. Do not wait to see if the symptoms will go away. Get medical help right away. Call your local emergency services (911 in the [place] Do not drive yourself to the hospital. Summary Round ligament pain is felt in the lower abdomen or groin. This pain usually begins in the second trimester (13-28 weeks) and should only last for a few seconds when it occurs. You may notice the pain when you suddenly change position, when you cough or sneeze, or during physical activity. Relaxing, flexing your knees to your abdomen, lying on one side, or taking a warm bath may help to get rid of the pain. Contact your health care provider if the pain does not go away. This information is not intended to replace advice given to you by your health care provider. Make sure you discuss any questions you have with your health care provider. Document Revised: 06/30/2020 Document Reviewed: 06/30/2020 Elsevier Patient Education  [place].

## 2021-02-07 NOTE — Progress Notes (Addendum)
ROB doing well, having some flank pain today. She denies fever. Has a history of kidney stones in the past. Discussed self help measures and red flag symptoms. She verbalizes understanding. Discussed u/s if pain does not resolve or worsens. U/s results reviewed. See below. Follow up 4 wk or prn .   Philip Aspen, CNM  Patient Name: Elizabeth Berger DOB: 1990/07/28 MRN: [id number]  ULTRASOUND REPORT  Location: [facility] Date of Service: 02/07/2021   Indications:Anatomy Ultrasound Findings:  Nelda Marse[place] intrauterine pregnancy is visualized with FHR at 141 BPM.   Biometrics give an (U/S) Gestational age of [redacted]w[redacted]d and an (U/S) EDD of 06/25/21; this correlates with the clinically established Estimated Date of Delivery: 06/23/21   Fetal presentation is Cephalic.  EFW: 335g / 12oz. Placenta: posterior. Grade: 0 AFI: subjectively normal.  Anatomic survey is complete and normal; Gender - female.    Right Ovary: is normal in appearance. Left Ovary: is normal appearance. Survey of the adnexa demonstrates no adnexal masses.  There is no free peritoneal fluid in the cul de sac.  Impression: 1. [redacted]w[redacted]d Viable Singleton Intrauterine pregnancy by U/S. 2. (U/S) EDD is consistent with Clinically established Estimated Date of Delivery: 06/23/21 . 3. Normal Anatomy Scan  Recommendations: 1.Clinical correlation with the patient's History and Physical Exam.  Vivien Rota  [place]

## 2021-02-07 NOTE — OB Triage Note (Signed)
L&D OB Triage Note  SUBJECTIVE Elizabeth Berger is a 30 y.o. G1P0000 female at [redacted]w[redacted]d, EDD Estimated Date of Delivery: 06/23/21 who presented to triage with complaints of right flank pain that started yesterday. She states that intensified significantly this evening. She denies fever and urinary symptoms. She denies contractions, feels good movement.   OB History  Gravida Para Term Preterm AB Living  1 0 0 0 0 0  SAB IAB Ectopic Multiple Live Births  0 0 0 0 0    # Outcome Date GA Lbr Len/2nd Weight Sex Delivery Anes PTL Lv  1 Current             Medications Prior to Admission  Medication Sig Dispense Refill Last Dose   omeprazole (PRILOSEC) 20 MG capsule Take 1 capsule (20 mg total) by mouth daily. 30 capsule 3 Past Week   imiquimod (ALDARA) 5 % cream Apply topically 3 (three) times a week. Apply a thin layer 3 times per week  bedtime; leave on- 6 to 10 hours, then wash with mild soap and water. Until total clearance of warts or f max of 16 wks 12 each 0    imiquimod (ALDARA) 5 % cream APPLY THIN LAYER 3 TIMES PER WEEK AT BEDTIME;LEAVE ON FOR 6- 10 HOURS,THEN WASH WITH MILD SOAP & WATER. USE UNTIL TOTAL CLEARANCE OR MAX 16 WEEKS. 12 each 0    ondansetron (ZOFRAN ODT) 4 MG disintegrating tablet Take 1 tablet (4 mg total) by mouth every 8 (eight) hours as needed for nausea or vomiting. (Patient not taking: Reported on 02/07/2021) 20 tablet 0 Not Taking   promethazine (PHENERGAN) 25 MG tablet Take 1 tablet (25 mg total) by mouth every 6 (six) hours as needed for nausea or vomiting. (Patient not taking: Reported on 02/07/2021) 30 tablet 1 Not Taking   valACYclovir (VALTREX) 1000 MG tablet Take 1 tablet (1,000 mg total) by mouth daily. 90 tablet 3      OBJECTIVE  Nursing Evaluation:   BP (!) 102/58 (BP Location: Right Arm)   Pulse 91   Temp 98 F (36.7 C) (Oral)   Resp 16   Ht 5\' 2"  (1.575 m)   Wt 59.9 kg   LMP 09/03/2020 (Exact Date)   SpO2 100%   BMI 24.14 kg/m    Findings:    Mild right hydronephrosis          NST was performed and has been reviewed by me.  NST INTERPRETATION: not appropriate for [redacted] wks gestation FHT: 140s  Ctx: absent.   CLINICAL DATA:  Renal calculi, right flank pain for 2 days  EXAM: RENAL / URINARY TRACT ULTRASOUND COMPLETE  COMPARISON:  None.  FINDINGS: Right Kidney: Renal measurements: 10.6 x 4.6 x 4.3 cm = volume: 108 mL. Normal renal echogenicity. Mild right hydronephrosis. No nephrolithiasis.   Left Kidney:  Renal measurements: 10.8 x 4.7 x 5.1 cm = volume: 136 mL. Echogenicity within normal limits. No mass or hydronephrosis visualized.   Bladder: Appears normal for degree of bladder distention.   Other: Gravid uterus is partially visualized with fetus in cephalic presentation.  IMPRESSION: 1. Minimal right-sided hydronephrosis, which could be due to extrinsic ureteral compression from the gravid uterus. No evidence of nephrolithiasis. 2. Otherwise unremarkable renal ultrasound.     Electronically Signed   By: Randa Ngo M.D.   On: 02/07/2021 17:24  UA: small Hgb, Ketones present, rare bacteria   ASSESSMENT Impression:  1.  Pregnancy:  G1P0000 at [redacted]w[redacted]d ,  EDD Estimated Date of Delivery: 06/23/21 2.  Reassuring fetal and maternal status 3.  Probable kidney stone.  4. Rocephin 1 g IM x 1    PLAN 1. Current condition and above findings reviewed.  Reassuring fetal and maternal condition. 2. Discharge home with standard labor precautions given to return to L&D or call the office for problems. Pain medications ordered. Red flag symptoms reviewed.  3. Dr. Marcelline Mates consulted on plan of care. 4. Continue routine prenatal care.  Philip Aspen, CNM

## 2021-02-07 NOTE — OB Triage Note (Signed)
Pt presents c/o lower back pain on her right side. Pt denies any LOF or bleeding. Pt denies any burning or hurting when urinating. Pt reports urine frequency. Pt has history of kidney stones. Pt states pain is constant and rates it 8/10 on pain scale. FHT of 145. Ctx monitor applied. VSS. Will continue to monitor.

## 2021-02-08 ENCOUNTER — Other Ambulatory Visit: Payer: Self-pay

## 2021-02-08 LAB — URINE CULTURE: Culture: <10000 — AB

## 2021-02-10 LAB — URINE CULTURE

## 2021-03-07 ENCOUNTER — Other Ambulatory Visit: Payer: Self-pay

## 2021-03-07 ENCOUNTER — Ambulatory Visit (INDEPENDENT_AMBULATORY_CARE_PROVIDER_SITE_OTHER): Payer: No Typology Code available for payment source | Admitting: Certified Nurse Midwife

## 2021-03-07 VITALS — BP 111/5 | HR 114 | Wt 139.4 lb

## 2021-03-07 DIAGNOSIS — Z3A24 24 weeks gestation of pregnancy: Secondary | ICD-10-CM

## 2021-03-07 DIAGNOSIS — Z3402 Encounter for supervision of normal first pregnancy, second trimester: Secondary | ICD-10-CM

## 2021-03-07 LAB — POCT URINALYSIS DIPSTICK OB
Bilirubin, UA: NEGATIVE
Blood, UA: NEGATIVE
Glucose, UA: NEGATIVE
Ketones, UA: NEGATIVE
Leukocytes, UA: NEGATIVE
Nitrite, UA: NEGATIVE
POC,PROTEIN,UA: NEGATIVE
Spec Grav, UA: 1.015 (ref 1.010–1.025)
Urobilinogen, UA: 0.2 E.U./dL
pH, UA: 6.5 (ref 5.0–8.0)

## 2021-03-07 NOTE — Patient Instructions (Signed)
Hernia, Adult   A hernia is the bulging of an organ or tissue through a weak spot in the muscles of the abdomen. Hernias develop most often near the belly button (navel) or the area where the leg meets the lower abdomen (groin). Common types of hernias include: Incisional hernia. This type bulges through a scar from an abdominal surgery. Umbilical hernia. This type develops near the navel. Inguinal hernia. This type develops in the groin or scrotum. Femoral hernia. This type develops below the groin, in the upper thigh area. Hiatal hernia. This type occurs when part of the stomach slides above the muscle that separates the abdomen from the chest (diaphragm). What are the causes? This condition may be caused by: Heavy lifting. Coughing over a long period of time. Straining to have a bowel movement. Constipation can lead to straining. An incision made during abdominal surgery. A physical problem that is present at birth (congenital defect). Being overweight or obese. Smoking. Excess fluid in the abdomen. Undescended testicles in males. What are the signs or symptoms? The main symptom is a skin-colored, rounded bulge in the area of the hernia. However, a bulge may not always be present. It may grow bigger or be more visible when you cough or strain (such as when lifting something heavy). A hernia that can be pushed back into the abdomen (is reducible) rarely causes pain. A hernia that cannot be pushed back into the abdomen (is incarcerated) may lose its blood supply (become strangulated). A hernia that is incarcerated may cause: Pain. Fever. Nausea and vomiting. Swelling. Constipation. How is this diagnosed? A hernia may be diagnosed based on: Your symptoms and medical history. A physical exam. Your health care provider may ask you to cough or move in certain ways to see if the hernia becomes visible. Imaging tests, such as: X-rays. Ultrasound. CT scan. How is this treated? A hernia  that is small and painless may not need to be treated. A hernia that is large or painful may be treated with surgery. Inguinal hernias may be treated with surgery to prevent incarceration or strangulation. Strangulated hernias are always treated with surgery because the strangulation causes a lack of blood supply to the trapped organ or tissue. Surgery to treat a hernia involves pushing the bulge back into place and repairing the weak area of the muscle or abdominal wall. Follow these instructions at home: Activity Avoid straining. Do not lift anything that is heavier than 10 lb (4.5 kg), or the limit that you are told, until your health care provider says that it is safe. When lifting heavy objects, lift with your leg muscles, not your back muscles. Preventing constipation Take actions to prevent constipation. Constipation leads to straining with bowel movements, which can make a hernia worse or cause a hernia repair to break down. Your health care provider may recommend that you take these actions to prevent or treat constipation: Drink enough fluid to keep your urine pale yellow. Take over-the-counter or prescription medicines. Eat foods that are high in fiber, such as beans, whole grains, and fresh fruits and vegetables. Limit foods that are high in fat and processed sugars, such as fried or sweet foods. General instructions When coughing, try to cough gently. You may try to push the hernia back in place by very gently pressing on it while lying down. Do not try to force the bulge back in if it will not push in easily. If you are overweight, work with your health care provider to lose  weight safely. Do not use any products that contain nicotine or tobacco. These products include cigarettes, chewing tobacco, and vaping devices, such as e-cigarettes. If you need help quitting, ask your health care provider. If you are scheduled for hernia repair, watch your hernia for any changes in shape, size,  or color. Tell your health care provider about any changes or new symptoms. Take over-the-counter and prescription medicines only as told by your health care provider. Keep all follow-up visits. This is important. Contact a health care provider if: You develop new pain, swelling, or redness around your hernia. You have signs of constipation, such as: Fewer bowel movements in a week than normal. Difficulty having a bowel movement. Stools that are dry, hard, or larger than normal. Get help right away if: You have a fever or chills. You have abdominal pain that gets worse. You feel nauseous or you vomit. You cannot push the hernia back in place by very gently pressing on it while lying down. Do not try to force the bulge back in if it will not go in easily. The hernia: Changes in shape, size, or color. Feels hard or tender. These symptoms may represent a serious problem that is an emergency. Do not wait to see if the symptoms will go away. Get medical help right away. Call your local emergency services (911 in the [place] Do not drive yourself to the hospital. Summary A hernia is the bulging of an organ or tissue through a weak spot in the muscles of the abdomen. The main symptom is a skin-colored bulge in the hernia area. However, a bulge may not always be present. It may grow bigger or more visible when you cough or strain (such as when having a bowel movement). A hernia that is small and painless may not need to be treated. A hernia that is large or painful may be treated with surgery. Surgery to treat a hernia involves pushing the bulge back into place and repairing the weak part of the abdomen. This information is not intended to replace advice given to you by your health care provider. Make sure you discuss any questions you have with your health care provider. Document Revised: 11/24/2019 Document Reviewed: 11/24/2019 Elsevier Patient Education  [place]. Round Ligament  Pain The round ligaments are a pair of cord-like tissues that help support the uterus. They can become a source of pain during pregnancy as the ligaments soften and stretch as the baby grows. The pain usually begins in the second trimester (13-28 weeks) of pregnancy, and should only last for a few seconds when it occurs. However, the pain can come and go until the baby is delivered. The pain does not cause harm to the baby. Round ligament pain is usually a short, sharp, and pinching pain, but it can also be a dull, lingering, and aching pain. The pain is felt in the lower side of the abdomen or in the groin. It usually starts deep in the groin and moves up to the outside of the hip area. The pain may happen when you: Suddenly change position, such as quickly going from a sitting to standing position. Do physical activity. Cough or sneeze. Follow these instructions at home: Managing pain  When the pain starts, relax. Then, try any of these methods to help with the pain: Sit down. Flex your knees up to your abdomen. Lie on your side with one pillow under your abdomen and another pillow between your legs. Sit in a warm  bath for 15-20 minutes or until the pain goes away. General instructions Watch your condition for any changes. Move slowly when you sit down or stand up. Stop or reduce your physical activities if they cause pain. Avoid long walks if they cause pain. Take over-the-counter and prescription medicines only as told by your health care provider. Keep all follow-up visits. This is important. Contact a health care provider if: Your pain does not go away with treatment. You feel pain in your back that you did not have before. Your medicine is not helping. You have a fever or chills. You have nausea or vomiting. You have diarrhea. You have pain when you urinate. Get help right away if: You have pain that is a rhythmic, cramping pain similar to labor pains. Labor pains are usually 2  minutes apart, last for about 1 minute, and involve a bearing down feeling or pressure in your pelvis. You have vaginal bleeding. These symptoms may represent a serious problem that is an emergency. Do not wait to see if the symptoms will go away. Get medical help right away. Call your local emergency services (911 in the [place] Do not drive yourself to the hospital. Summary Round ligament pain is felt in the lower abdomen or groin. This pain usually begins in the second trimester (13-28 weeks) and should only last for a few seconds when it occurs. You may notice the pain when you suddenly change position, when you cough or sneeze, or during physical activity. Relaxing, flexing your knees to your abdomen, lying on one side, or taking a warm bath may help to get rid of the pain. Contact your health care provider if the pain does not go away. This information is not intended to replace advice given to you by your health care provider. Make sure you discuss any questions you have with your health care provider. Document Revised: 06/30/2020 Document Reviewed: 06/30/2020 Elsevier Patient Education  [place].

## 2021-03-07 NOTE — Progress Notes (Signed)
ROB doing well, feeling good movement. Denies any more flank pain. Discussed glucose screen next visit. Handout given on what she can eat piror to test given. Follow up 4 wks.   Philip Aspen, CNM

## 2021-03-26 ENCOUNTER — Encounter: Payer: Self-pay | Admitting: Certified Nurse Midwife

## 2021-03-28 ENCOUNTER — Encounter: Payer: Self-pay | Admitting: Obstetrics and Gynecology

## 2021-03-28 ENCOUNTER — Other Ambulatory Visit: Payer: Self-pay

## 2021-03-28 ENCOUNTER — Observation Stay
Admission: EM | Admit: 2021-03-28 | Discharge: 2021-03-28 | Disposition: A | Discharge status: Home / Self Care | Payer: No Typology Code available for payment source | Attending: Obstetrics and Gynecology | Admitting: Obstetrics and Gynecology

## 2021-03-28 DIAGNOSIS — Z3A27 27 weeks gestation of pregnancy: Secondary | ICD-10-CM

## 2021-03-28 DIAGNOSIS — O98512 Other viral diseases complicating pregnancy, second trimester: Secondary | ICD-10-CM | POA: Diagnosis not present

## 2021-03-28 DIAGNOSIS — Z20822 Contact with and (suspected) exposure to covid-19: Secondary | ICD-10-CM | POA: Diagnosis not present

## 2021-03-28 DIAGNOSIS — O26892 Other specified pregnancy related conditions, second trimester: Secondary | ICD-10-CM

## 2021-03-28 DIAGNOSIS — U071 COVID-19: Secondary | ICD-10-CM | POA: Diagnosis not present

## 2021-03-28 DIAGNOSIS — O26893 Other specified pregnancy related conditions, third trimester: Secondary | ICD-10-CM | POA: Diagnosis present

## 2021-03-28 DIAGNOSIS — M549 Dorsalgia, unspecified: Secondary | ICD-10-CM | POA: Diagnosis present

## 2021-03-28 DIAGNOSIS — O99891 Dorsalgia, unspecified: Secondary | ICD-10-CM | POA: Diagnosis present

## 2021-03-28 LAB — URINALYSIS, ROUTINE W REFLEX MICROSCOPIC
Bilirubin Urine: NEGATIVE
Glucose, UA: NEGATIVE mg/dL
Hgb urine dipstick: NEGATIVE
Leukocytes,Ua: NEGATIVE
Nitrite: NEGATIVE
Protein, ur: 30 mg/dL — AB
Specific Gravity, Urine: >1.03 — ABNORMAL HIGH (ref 1.005–1.030)
pH: 6 (ref 5.0–8.0)

## 2021-03-28 LAB — RESP PANEL BY RT-PCR (FLU A&B, COVID) ARPGX2
Influenza A by PCR: NEGATIVE
Influenza B by PCR: NEGATIVE
SARS Coronavirus 2 by RT PCR: POSITIVE — AB

## 2021-03-28 MED ORDER — ACETAMINOPHEN 500 MG PO TABS: 1000.0000 mg | ORAL_TABLET | Freq: Four times a day (QID) | ORAL | Status: DC | PRN

## 2021-03-28 NOTE — OB Triage Note (Signed)
Pt presents to L&D with c/o back pain since Wednesday, worsening Friday with stabbing pain that interrupts sleep. Pt states pain radiates to chest occasionally making pt SOB. Pt reports urinary frequently but also reports flu like symptoms since Friday with no fever. 2 at home covid tests negative.  Pt reports good fetal movement. Denies contractions, LOF or vaginal bleeding. States occasionally her abd gets tight but is not painful. EFM and toco applied and explained. Plan to monitor fetal and maternal well being and assess for contractions. Urine also obtained.

## 2021-03-28 NOTE — Final Progress Note (Signed)
L&D OB Triage Note  Elizabeth Berger is a 30 y.o. G1P0000 female at [redacted]w[redacted]d, EDD Estimated Date of Delivery: 06/23/21 who presented to triage for complaints of back pain since last Wednesday, worsening on Friday that awoke her from her sleep.  Also has been noting flu like symptoms since Friday. Took 2 home COVID tests which were negative.  Does have a history of recent evaluation last month for possible kidney stone.  Denies vaginal bleeding or contractions, no nausea/vomiting or fever. She was evaluated by the nurses with significant findings for COVID positive testing. Mild dehydration present. Vital signs stable. An NST was performed and has been reviewed by MD. She was treated with PO hydration and Tylenol 1000 mg.   NST INTERPRETATION: Indications: rule out uterine contractions  Mode: External Baseline Rate (A): 145 bpm Variability: Moderate Accelerations: 15 x 15 Decelerations: Variable     Contraction Frequency (min): UI  Impression: reactive    Labs:  Results for orders placed or performed during the hospital encounter of 03/28/21  Resp Panel by RT-PCR (Flu A&B, Covid) Nasopharyngeal Swab   Specimen: Nasopharyngeal Swab; Nasopharyngeal(NP) swabs in vial transport medium  Result Value Ref Range   SARS Coronavirus 2 by RT PCR POSITIVE (A) NEGATIVE   Influenza A by PCR NEGATIVE NEGATIVE   Influenza B by PCR NEGATIVE NEGATIVE  Urinalysis, Routine w reflex microscopic Nasopharyngeal Swab  Result Value Ref Range   Color, Urine YELLOW YELLOW   APPearance CLEAR CLEAR   Specific Gravity, Urine >1.030 (H) 1.005 - 1.030   pH 6.0 5.0 - 8.0   Glucose, UA NEGATIVE NEGATIVE mg/dL   Hgb urine dipstick NEGATIVE NEGATIVE   Bilirubin Urine NEGATIVE NEGATIVE   Ketones, ur TRACE (A) NEGATIVE mg/dL   Protein, ur 30 (A) NEGATIVE mg/dL   Nitrite NEGATIVE NEGATIVE   Leukocytes,Ua NEGATIVE NEGATIVE   RBC / HPF 0-5 0 - 5 RBC/hpf   WBC, UA 0-5 0 - 5 WBC/hpf   Bacteria, UA RARE (A) NONE SEEN    Squamous Epithelial / LPF 0-5 0 - 5   Mucus PRESENT     Plan: NST performed was reviewed and was found to be reactive. She was discharged home with COVID precautions in pregnancy.  Encouraged aggressive hydration. Continue routine prenatal care. Follow up with OB/GYN as previously scheduled next week.     Rubie Maid, MD [facility]

## 2021-04-04 ENCOUNTER — Ambulatory Visit (INDEPENDENT_AMBULATORY_CARE_PROVIDER_SITE_OTHER): Payer: No Typology Code available for payment source | Admitting: Certified Nurse Midwife

## 2021-04-04 ENCOUNTER — Other Ambulatory Visit: Payer: No Typology Code available for payment source

## 2021-04-04 ENCOUNTER — Other Ambulatory Visit: Payer: Self-pay

## 2021-04-04 VITALS — BP 120/83 | HR 120 | Wt 147.0 lb

## 2021-04-04 DIAGNOSIS — Z3483 Encounter for supervision of other normal pregnancy, third trimester: Secondary | ICD-10-CM

## 2021-04-04 DIAGNOSIS — Z23 Encounter for immunization: Secondary | ICD-10-CM | POA: Diagnosis not present

## 2021-04-04 LAB — POCT URINALYSIS DIPSTICK OB
Bilirubin, UA: NEGATIVE
Blood, UA: NEGATIVE
Glucose, UA: NEGATIVE
Ketones, UA: NEGATIVE
Leukocytes, UA: NEGATIVE
Nitrite, UA: NEGATIVE
POC,PROTEIN,UA: NEGATIVE
Spec Grav, UA: 1.01 (ref 1.010–1.025)
Urobilinogen, UA: 0.2 E.U./dL
pH, UA: 7 (ref 5.0–8.0)

## 2021-04-04 MED ORDER — TETANUS-DIPHTH-ACELL PERTUSSIS 5-2.5-18.5 LF-MCG/0.5 IM SUSY
0.5000 mL | PREFILLED_SYRINGE | Freq: Once | INTRAMUSCULAR | Status: AC
  Administered 2021-04-04: 0.5 mL via INTRAMUSCULAR

## 2021-04-04 NOTE — Patient Instructions (Signed)
[facility] Pediatrician List  [facility]  [address], [place], [place] [address]  Phone: [phone]  [facility] (second location)  [address] [place], [place] [address]  Phone: [phone]  [facility] ([place]) [address], [place], [place] [address] Phone: [phone]  [facility]  [address] [place], [place] [address]  Phone: [phone] 

## 2021-04-04 NOTE — Progress Notes (Signed)
ROB doing well. Feels good movement. 28 wk labs today: Glucose screen/RPR/CBC. Tdap, Blood transfusion consent completed, all questions answered. Ready set baby reviewed, see check list for topics covered. Sample birth plan given, will follow up in upcoming visits. Discussed birth control after delivery, information pamphlet given.   Follow up 2 wk  for ROB or sooner if needed.    Philip Aspen, CNM

## 2021-04-05 LAB — RPR: RPR Ser Ql: NONREACTIVE

## 2021-04-05 LAB — CBC
Hematocrit: 36.4 % (ref 34.0–46.6)
Hemoglobin: 12.5 g/dL (ref 11.1–15.9)
MCH: 31.5 pg (ref 26.6–33.0)
MCHC: 34.3 g/dL (ref 31.5–35.7)
MCV: 92 fL (ref 79–97)
Platelets: 306 10*3/uL (ref 150–450)
RBC: 3.97 x10E6/uL (ref 3.77–5.28)
RDW: 11.7 % (ref 11.7–15.4)
WBC: 10.4 10*3/uL (ref 3.4–10.8)

## 2021-04-05 LAB — GLUCOSE, 1 HOUR GESTATIONAL: Gestational Diabetes Screen: 118 mg/dL (ref 70–139)

## 2021-04-19 ENCOUNTER — Encounter: Payer: Self-pay | Admitting: Certified Nurse Midwife

## 2021-04-19 ENCOUNTER — Ambulatory Visit (INDEPENDENT_AMBULATORY_CARE_PROVIDER_SITE_OTHER): Payer: No Typology Code available for payment source | Admitting: Certified Nurse Midwife

## 2021-04-19 ENCOUNTER — Other Ambulatory Visit: Payer: Self-pay

## 2021-04-19 VITALS — BP 109/77 | HR 134 | Wt 148.4 lb

## 2021-04-19 DIAGNOSIS — Z3483 Encounter for supervision of other normal pregnancy, third trimester: Secondary | ICD-10-CM

## 2021-04-19 DIAGNOSIS — Z3A30 30 weeks gestation of pregnancy: Secondary | ICD-10-CM

## 2021-04-19 LAB — POCT URINALYSIS DIPSTICK OB
Bilirubin, UA: NEGATIVE
Blood, UA: NEGATIVE
Glucose, UA: NEGATIVE
Ketones, UA: NEGATIVE
Leukocytes, UA: NEGATIVE
Nitrite, UA: NEGATIVE
POC,PROTEIN,UA: NEGATIVE
Spec Grav, UA: 1.015 (ref 1.010–1.025)
Urobilinogen, UA: 0.2 E.U./dL
pH, UA: 7 (ref 5.0–8.0)

## 2021-04-19 NOTE — Patient Instructions (Signed)
[place] Pediatrician List  [facility]  [address], [place], [place] [address]  Phone: [phone]  [facility] (second location)  [address] [place], [place] [address]  Phone: [phone]  [facility] ([place]) [address], [place], [place] [address] Phone: [phone]  [facility]  [address] , [place] [address]  Phone: [phone] 

## 2021-04-19 NOTE — Progress Notes (Signed)
ROB doing well has experienced some pressure and braxton hicks, reassurance given. Discussed self help measures. Reviewed birth plan , will wait to see how things go but is planning on using medications for pain. Plans on bottle feeding. Feeling regular fetal movement. No other concerns. Follow up 2 wk with Missy for ROB.   Philip Aspen, CNM

## 2021-04-28 ENCOUNTER — Telehealth: Payer: Self-pay | Admitting: Certified Nurse Midwife

## 2021-04-28 ENCOUNTER — Telehealth: Payer: No Typology Code available for payment source | Admitting: Physician Assistant

## 2021-04-28 DIAGNOSIS — R0602 Shortness of breath: Secondary | ICD-10-CM

## 2021-04-28 DIAGNOSIS — Z3490 Encounter for supervision of normal pregnancy, unspecified, unspecified trimester: Secondary | ICD-10-CM

## 2021-04-28 NOTE — Progress Notes (Signed)
For the safety of you and your child, I recommend a face to face office visit with a health care provider.  Many mothers need to take medicines during their pregnancy and while nursing.  Almost all medicines pass into the breast milk in small quantities.  Most are generally considered safe for a mother to take but some medicines must be avoided.  After reviewing your E-Visit request, I recommend that you consult your OB/GYN or pediatrician for medical advice in relation to your condition and prescription medications while pregnant or breastfeeding.  NOTE:  There will be NO CHARGE for this eVisit  If you are having a true medical emergency please call 911.    For an urgent face to face visit, [facility] has six urgent care centers for your convenience:     [facility] [place] Get Driving Directions [phone] [address] [place], [place] [address]    [facility] ([place]) Get Driving Directions [phone] [address] [place], [place] [address]  [facility] ([place]) Get Driving Directions [phone] [address] ,  [place]  [address]  [facility] Get Driving Directions [phone] [address] [place], [place] [address]   [facility] [place] Get Driving Directions  [phone] [address] [place], [place] [address]   [facility] [place] Get Driving Directions [phone] [address] [place],  [address]  Your MyChart E-visit questionnaire answers were reviewed by a board certified advanced clinical practitioner to complete your personal care plan based on your specific symptoms.  Thank you for using e-Visits. 

## 2021-04-28 NOTE — Telephone Encounter (Signed)
Patient is pregnant and is having congestion, yellow mucus when blows nose. Would like to know what she can take for it.   CB# [phone]

## 2021-04-28 NOTE — Telephone Encounter (Signed)
LM w/ pt.

## 2021-05-01 NOTE — L&D Delivery Note (Addendum)
Delivery Note  ? ?Elizabeth Berger is a 31 y.o. G1P0000 at [redacted]w[redacted]d Estimated Date of Delivery: 06/23/21.  ? ?PRE-OPERATIVE DIAGNOSIS:  ?1) [redacted]w[redacted]d pregnancy.  ?   Maternal tachycardia ? ?POST-OPERATIVE DIAGNOSIS:  ?1) [redacted]w[redacted]d pregnancy s/p Vaginal, Vacuum (Extractor)  ? ?Delivery Type: Vaginal, Vacuum (Extractor)   ? ?Delivery Anesthesia: Epidural;Local  ? ?Labor Complications:  Prolonged second stage ?  ? ?ESTIMATED BLOOD LOSS: 400  ml   ? ?FINDINGS:   ?1) female infant, "[name], "Apgar scores of 6   at 1 minute and 9   at 5 minutes. Birthweight pending. ? ?SPECIMENS:  ? PLACENTA: ?  Appearance: Intact Circumvallate membranes. ?  Removal: Spontaneous    ?  Disposition:  Per protocol ? CORD BLOOD: Not Indicated ? ?DISPOSITION:  ?Infant to left in stable condition in the delivery room, with L&D personnel and mother, ? ?NARRATIVE SUMMARY: ?Labor course:  [name] is a G1P0000 at [redacted]w[redacted]d who presented to Labor & Delivery for induction of labor. Her initial cervical exam was 2/50/-3. Labor proceeded linearly with misoprostol, Pitocin, and AROM. She was found to be completely dilated at 0700. ?She had difficulty coordinating and sustaining pushing efforts. After approximately 3 hours of pushing, she had brought the baby to +3 station, and Dr. Amalia Hailey was consulted for vacuum delivery (see note).  A viable female infant was born via VAVD at 20. The infant was placed skin-to-skin with mother. The cord was doubly clamped and cut after approximately 2 minutes and the infant was brought to the warmer for further evaluation and stimulation. ?The placenta delivered spontaneously and was noted to be intact with a 3VC and circumvallate membranes. ?A perineal and vaginal examination was performed. ?Lacerations: 2nd degree  ?Lacerations were repaired with Vicryl suture using local anesthesia. ?The patient tolerated this well. Mother and baby were left in stable condition. ?Dr. Amalia Hailey proctored this birth. ? ?Lloyd Huger, CNM ?06/30/2021 ?12:08 PM  ?

## 2021-05-04 ENCOUNTER — Encounter: Payer: Self-pay | Admitting: Obstetrics

## 2021-05-04 ENCOUNTER — Other Ambulatory Visit: Payer: Self-pay

## 2021-05-04 ENCOUNTER — Ambulatory Visit (INDEPENDENT_AMBULATORY_CARE_PROVIDER_SITE_OTHER): Payer: No Typology Code available for payment source | Admitting: Obstetrics

## 2021-05-04 VITALS — BP 106/74 | HR 121 | Wt 152.9 lb

## 2021-05-04 DIAGNOSIS — Z3483 Encounter for supervision of other normal pregnancy, third trimester: Secondary | ICD-10-CM

## 2021-05-04 DIAGNOSIS — Z3A32 32 weeks gestation of pregnancy: Secondary | ICD-10-CM | POA: Diagnosis not present

## 2021-05-04 LAB — POCT URINALYSIS DIPSTICK OB
Bilirubin, UA: NEGATIVE
Blood, UA: NEGATIVE
Glucose, UA: NEGATIVE
Ketones, UA: NEGATIVE
Leukocytes, UA: NEGATIVE
Nitrite, UA: NEGATIVE
Spec Grav, UA: 1.015 (ref 1.010–1.025)
Urobilinogen, UA: 0.2 E.U./dL
pH, UA: 7.5 (ref 5.0–8.0)

## 2021-05-04 NOTE — Progress Notes (Signed)
ROB at [redacted]w[redacted]d. Feeling good fetal movement. Cramping has improved significantly. She reports having some clear, slimy discharge but is not sure if it was her mucus plug. Denies vaginal bleeding. Discussed comfort measures for back pain. Reviewed s/s of preterm labor and pre-eclampsia. Undecided on birth control. RTC in 2 weeks.

## 2021-05-15 ENCOUNTER — Encounter: Payer: Self-pay | Admitting: Certified Nurse Midwife

## 2021-05-18 ENCOUNTER — Other Ambulatory Visit: Payer: Self-pay

## 2021-05-18 ENCOUNTER — Ambulatory Visit (INDEPENDENT_AMBULATORY_CARE_PROVIDER_SITE_OTHER): Payer: No Typology Code available for payment source | Admitting: Certified Nurse Midwife

## 2021-05-18 ENCOUNTER — Encounter: Payer: Self-pay | Admitting: Certified Nurse Midwife

## 2021-05-18 VITALS — BP 110/75 | HR 125 | Wt 155.5 lb

## 2021-05-18 DIAGNOSIS — Z3A34 34 weeks gestation of pregnancy: Secondary | ICD-10-CM

## 2021-05-18 DIAGNOSIS — R002 Palpitations: Secondary | ICD-10-CM

## 2021-05-18 DIAGNOSIS — Z3483 Encounter for supervision of other normal pregnancy, third trimester: Secondary | ICD-10-CM

## 2021-05-18 DIAGNOSIS — R000 Tachycardia, unspecified: Secondary | ICD-10-CM

## 2021-05-18 LAB — POCT URINALYSIS DIPSTICK OB
Bilirubin, UA: NEGATIVE
Blood, UA: NEGATIVE
Glucose, UA: NEGATIVE
Ketones, UA: NEGATIVE
Leukocytes, UA: NEGATIVE
Nitrite, UA: NEGATIVE
POC,PROTEIN,UA: NEGATIVE
Spec Grav, UA: 1.01 (ref 1.010–1.025)
Urobilinogen, UA: 0.2 E.U./dL
pH, UA: 7.5 (ref 5.0–8.0)

## 2021-05-18 MED ORDER — VALACYCLOVIR HCL 1 G PO TABS
1000.0000 mg | ORAL_TABLET | Freq: Every day | ORAL | 2 refills | Status: DC
  Filled 2021-05-18: qty 30, 30d supply, fill #0

## 2021-05-18 NOTE — Progress Notes (Signed)
ROB doing well, feels good movement. Referral placed to cardiology for palpitations, shortness of breath, dizzy spells. Discussed swabs next visit. Discussed HSV valtrex daily at 36 wks, orders placed. Follow up 1 wk with Missy for ROB.   Philip Aspen, CNM

## 2021-05-18 NOTE — Patient Instructions (Signed)

## 2021-05-19 ENCOUNTER — Encounter: Payer: Self-pay | Admitting: Cardiovascular Disease

## 2021-05-19 ENCOUNTER — Ambulatory Visit (INDEPENDENT_AMBULATORY_CARE_PROVIDER_SITE_OTHER): Payer: No Typology Code available for payment source | Admitting: Cardiovascular Disease

## 2021-05-19 ENCOUNTER — Other Ambulatory Visit: Payer: Self-pay

## 2021-05-19 VITALS — BP 108/62 | HR 134 | Ht 62.0 in | Wt 155.2 lb

## 2021-05-19 DIAGNOSIS — I479 Paroxysmal tachycardia, unspecified: Secondary | ICD-10-CM

## 2021-05-19 DIAGNOSIS — U099 Post covid-19 condition, unspecified: Secondary | ICD-10-CM

## 2021-05-19 DIAGNOSIS — Z3A35 35 weeks gestation of pregnancy: Secondary | ICD-10-CM | POA: Diagnosis not present

## 2021-05-19 DIAGNOSIS — R002 Palpitations: Secondary | ICD-10-CM

## 2021-05-19 MED ORDER — PROPRANOLOL HCL 10 MG PO TABS
ORAL_TABLET | ORAL | 3 refills | Status: DC
  Filled 2021-05-19: qty 90, 15d supply, fill #0

## 2021-05-19 NOTE — Progress Notes (Signed)
Cardiology Office Note  Date:  05/19/2021   ID:  Elizabeth Berger, DOB January 26, 1991, MRN [id number]  PCP:  McLean-Scocuzza, Nino Glow, MD   Chief Complaint  Patient presents with   New Patient (Initial Visit)    Patient c/o palpitations, syncopal spell, shortness of breath and chest pain. Medications reviewed by the patient verbally.     HPI:  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 that she is 35 weeks  Recent episode mid January 2023, She was very busy doing activities at home Got very hot Had episodes of severe abdominal cramping, felt like a bowel movement, attempted to go to the bathroom Had urge to vomit, got diaphoretic dizzy She lay down on the floor, boyfriend and her, gave her some water, after several minutes started to feel better  Has not had further episodes since that time  She is hoping to continue to work up until her delivery  EKG personally reviewed by myself on todays visit Sinus tachycardia rate 134 bpm no significant ST-T wave changes  Orthostatics performed today, no significant change in pressure with standing, there is increase in heart rate from 128 up to 150 with standing  PMH:   has a past medical history of FH: migraines, Herpes, and Migraines.  PSH:    Past Surgical History:  Procedure Laterality Date   TONSILLECTOMY      Current Outpatient Medications  Medication Sig Dispense Refill   omeprazole (PRILOSEC) 20 MG capsule Take 1 capsule (20 mg total) by mouth daily. 30 capsule 3   ondansetron (ZOFRAN ODT) 4 MG  disintegrating tablet Take 1 tablet (4 mg total) by mouth every 8 (eight) hours as needed for nausea or vomiting. 20 tablet 0   Prenatal Vit-Fe Fumarate-FA (PRENATAL PO) Take by mouth.     propranolol (INDERAL) 10 MG tablet 10 to 20 mg up to three times a day sparingly as needed for tachycardia 90 tablet 3   oxyCODONE-acetaminophen (PERCOCET) 5-325 MG tablet Take 1-2 tablets by mouth every 6 (six) hours as needed for severe pain. (Patient not taking: Reported on 05/18/2021) 20 tablet 0   valACYclovir (VALTREX) 1000 MG tablet Take 1 tablet (1,000 mg total) by mouth daily. (Patient not taking: Reported on 05/19/2021) 90 tablet 3   valACYclovir (VALTREX) 1000 MG tablet Take 1 tablet (1,000 mg total) by mouth daily. Daily starting at 36 wks until delivery (Patient not taking: Reported on 05/19/2021) 30 tablet 2   No current facility-administered medications for this visit.     Allergies:   Patient has no known allergies.   Social History:  The patient  reports that she has quit smoking. She has never used smokeless tobacco. She reports that she does not currently use alcohol. She reports that she does not currently use drugs.   Family History:   family history includes Arthritis in her mother; Cancer in her maternal grandfather; Cancer (age of onset: 35) in her maternal grandmother; Depression in her maternal grandmother and mother; Hearing loss in her maternal grandfather.    Review of Systems: Review of Systems  Constitutional: Negative.   HENT: Negative.  Respiratory: Negative.    Cardiovascular: Negative.   Gastrointestinal: Negative.   Musculoskeletal: Negative.   Neurological: Negative.   Psychiatric/Behavioral: Negative.    All other systems reviewed and are negative.   PHYSICAL EXAM: VS:  BP 108/62 (BP Location: Right Arm, Patient Position: Sitting, Cuff Size: Normal)    Pulse (!) 134    Ht 5\' 2"  (1.575 m)    Wt 155 lb 4 oz (70.4 kg)    LMP 09/03/2020 (Exact Date)    SpO2 99%     BMI 28.40 kg/m  , BMI Body mass index is 28.4 kg/m. GEN: Well nourished, well developed, in no acute distress HEENT: normal Neck: no JVD, carotid bruits, or masses Cardiac: Tachycardic, regular; no murmurs, rubs, or gallops,no edema  Respiratory:  clear to auscultation bilaterally, normal work of breathing GI: soft, nontender, nondistended, + BS MS: no deformity or atrophy Skin: warm and dry, no rash Neuro:  Strength and sensation are intact Psych: euthymic mood, full affect   Recent Labs: 04/04/2021: Hemoglobin 12.5; Platelets 306    Lipid Panel Lab Results  Component Value Date   CHOL 191 02/14/2019   HDL 76 02/14/2019   LDLCALC 103 (H) 02/14/2019   TRIG 67 02/14/2019      Wt Readings from Last 3 Encounters:  05/19/21 155 lb 4 oz (70.4 kg)  05/18/21 155 lb 8 oz (70.5 kg)  05/04/21 152 lb 14.4 oz (69.4 kg)     ASSESSMENT AND PLAN:  Problem List Items Addressed This Visit   None Visit Diagnoses     Palpitations    -  Primary   Relevant Orders   EKG 12-Lead   TSH   ECHOCARDIOGRAM COMPLETE   Post covid-19 condition, unspecified       Relevant Orders   ECHOCARDIOGRAM COMPLETE   [redacted] weeks gestation of pregnancy       Relevant Orders   ECHOCARDIOGRAM COMPLETE   Paroxysmal tachycardia (HCC)       Relevant Medications   propranolol (INDERAL) 10 MG tablet   Other Relevant Orders   ECHOCARDIOGRAM COMPLETE      Sinus tachycardia In the setting of [redacted] weeks pregnant Work-up ordered including echocardiogram TSH ordered Recommend she stay hydrated, avoid getting overheated, ambulate slowly, would avoid aggressive exercise, consider TED hose for orthostasis symptoms -Works in an Producer, 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 Discussed strategies to manage orthostasis  History of COVID-19 Reports having COVID in early winter 2022 Echocardiogram ordered to confirm normal cardiac function    Total encounter time more than 60 minutes  Greater than 50% was spent in counseling and coordination of care with the patient  Patient was seen in consultation for any [facility] and will be referred back to her office for ongoing care of the issues detailed above   Signed, Esmond Plants, M.D., Ph.D. [place], [place]

## 2021-05-19 NOTE — Patient Instructions (Addendum)
Medication Instructions:   Please START Propranolol 10 to 20 mg up to three times a day  sparingly as needed for tachycardia   If you need a refill on your cardiac medications before your next appointment, please call your pharmacy.   Lab work: TSH  Testing/Procedures: Your physician has requested that you have an echocardiogram. Echocardiography is a painless test that uses sound waves to create images of your heart. It provides your doctor with information about the size and shape of your heart and how well your hearts chambers and valves are working. This procedure takes approximately one hour. There are no restrictions for this procedure.  There is a possibility that an IV may need to be started during your test to inject an image enhancing agent. This is done to obtain more optimal pictures of your heart. Therefore we ask that you do at least drink some water prior to coming in to hydrate your veins.    Follow-Up: At [facility], you and your health needs are our priority.  As part of our continuing mission to provide you with exceptional heart care, we have created designated Provider Care Teams.  These Care Teams include your primary Cardiologist (physician) and Advanced Practice Providers (APPs -  Physician Assistants and Nurse Practitioners) who all work together to provide you with the care you need, when you need it.  You will need a follow up appointment as needed  Providers on your designated Care Team:   Murray Hodgkins, NP Christell Faith, PA-C Cadence Kathlen Mody, [place]  COVID-19 Vaccine Information can be found at: ShippingScam.co.uk For questions related to vaccine distribution or appointments, please email vaccine@[email or web address] or call [phone].

## 2021-05-20 LAB — TSH: TSH: 2.7 u[IU]/mL (ref 0.450–4.500)

## 2021-05-27 ENCOUNTER — Encounter: Payer: Self-pay | Admitting: Obstetrics

## 2021-05-27 ENCOUNTER — Ambulatory Visit (INDEPENDENT_AMBULATORY_CARE_PROVIDER_SITE_OTHER): Payer: No Typology Code available for payment source | Admitting: Obstetrics

## 2021-05-27 ENCOUNTER — Other Ambulatory Visit: Payer: Self-pay

## 2021-05-27 VITALS — BP 130/81 | HR 138 | Wt 155.8 lb

## 2021-05-27 DIAGNOSIS — Z3A36 36 weeks gestation of pregnancy: Secondary | ICD-10-CM

## 2021-05-27 DIAGNOSIS — Z3483 Encounter for supervision of other normal pregnancy, third trimester: Secondary | ICD-10-CM

## 2021-05-27 LAB — POCT URINALYSIS DIPSTICK OB
Bilirubin, UA: NEGATIVE
Blood, UA: NEGATIVE
Glucose, UA: NEGATIVE
Ketones, UA: NEGATIVE
Leukocytes, UA: NEGATIVE
Nitrite, UA: NEGATIVE
Spec Grav, UA: 1.02 (ref 1.010–1.025)
Urobilinogen, UA: 0.2 E.U./dL
pH, UA: 6.5 (ref 5.0–8.0)

## 2021-05-27 NOTE — Progress Notes (Signed)
ROB: She is feeling tired, but better than she felt yesterday. No new concerns. GBS and G/C collected today.

## 2021-05-27 NOTE — Progress Notes (Signed)
ROB at [redacted]w[redacted]d. Good fetal movement. Feeling some BH ctx. Denies LOF and vaginal bleeding. Lots of pelvic discomfort. Elizabeth Berger is feeling overwhelmed and very anxious. She has continued to have episodes of heart racing and lightheadedness. Cardiology prescribed propranolol, but she has not tried it yet. Reassured her that it okay to use in pregnancy. Discussed sleep hygiene. She has a wart on her gluteal fold that is growing. Prior to pregnancy, she used a cream that is not safe in pregnancy. Will follow up on if she prefers TCA in office (if available - currently backordered) or imiquimod cream. GC/chlamydia and GBS collected. ROB in one week.  Lloyd Huger, CNM

## 2021-05-29 LAB — STREP GP B NAA: Strep Gp B NAA: NEGATIVE

## 2021-05-29 LAB — GC/CHLAMYDIA PROBE AMP
Chlamydia trachomatis, NAA: NEGATIVE
Neisseria Gonorrhoeae by PCR: NEGATIVE

## 2021-05-30 ENCOUNTER — Encounter: Payer: Self-pay | Admitting: Certified Nurse Midwife

## 2021-05-31 ENCOUNTER — Encounter: Payer: Self-pay | Admitting: Obstetrics

## 2021-06-03 ENCOUNTER — Encounter: Payer: Self-pay | Admitting: Obstetrics and Gynecology

## 2021-06-03 ENCOUNTER — Other Ambulatory Visit: Payer: Self-pay

## 2021-06-03 ENCOUNTER — Observation Stay
Admission: EM | Admit: 2021-06-03 | Discharge: 2021-06-03 | Disposition: A | Discharge status: Home / Self Care | Payer: No Typology Code available for payment source | Attending: Certified Nurse Midwife | Admitting: Certified Nurse Midwife

## 2021-06-03 ENCOUNTER — Encounter: Payer: Self-pay | Admitting: Certified Nurse Midwife

## 2021-06-03 DIAGNOSIS — O368130 Decreased fetal movements, third trimester, not applicable or unspecified: Principal | ICD-10-CM | POA: Insufficient documentation

## 2021-06-03 DIAGNOSIS — Z3A37 37 weeks gestation of pregnancy: Secondary | ICD-10-CM | POA: Diagnosis not present

## 2021-06-03 LAB — WET PREP, GENITAL
Clue Cells Wet Prep HPF POC: NONE SEEN
Sperm: NONE SEEN
Trich, Wet Prep: NONE SEEN
WBC, Wet Prep HPF POC: <10 (ref <10)
Yeast Wet Prep HPF POC: NONE SEEN

## 2021-06-03 LAB — RUPTURE OF MEMBRANE (ROM)PLUS: Rom Plus: NEGATIVE

## 2021-06-03 MED ORDER — PROPRANOLOL HCL 10 MG PO TABS
10.0000 mg | ORAL_TABLET | Freq: Once | ORAL | Status: AC
  Administered 2021-06-03: 10 mg via ORAL
  Filled 2021-06-03: qty 1

## 2021-06-03 NOTE — OB Triage Note (Signed)
° ° °  L&D OB Triage Note  SUBJECTIVE Elizabeth Berger is a 31 y.o. G1P0000 female at 104w1d, EDD Estimated Date of Delivery: 06/23/21 who presented to triage with complaints of leaking of fluid and a decrease in fetal movement.   OB History  Gravida Para Term Preterm AB Living  1 0 0 0 0 0  SAB IAB Ectopic Multiple Live Births  0 0 0 0 0    # Outcome Date GA Lbr Len/2nd Weight Sex Delivery Anes PTL Lv  1 Current             Medications Prior to Admission  Medication Sig Dispense Refill Last Dose   omeprazole (PRILOSEC) 20 MG capsule Take 1 capsule (20 mg total) by mouth daily. 30 capsule 3 06/02/2021   Prenatal Vit-Fe Fumarate-FA (PRENATAL PO) Take by mouth.   06/02/2021   valACYclovir (VALTREX) 1000 MG tablet Take 1 tablet (1,000 mg total) by mouth daily. 90 tablet 3 06/02/2021   ondansetron (ZOFRAN ODT) 4 MG disintegrating tablet Take 1 tablet (4 mg total) by mouth every 8 (eight) hours as needed for nausea or vomiting. (Patient not taking: Reported on 06/03/2021) 20 tablet 0 Not Taking   oxyCODONE-acetaminophen (PERCOCET) 5-325 MG tablet Take 1-2 tablets by mouth every 6 (six) hours as needed for severe pain. (Patient not taking: Reported on 05/18/2021) 20 tablet 0    propranolol (INDERAL) 10 MG tablet 10 to 20 mg up to three times a day sparingly as needed for tachycardia (Patient not taking: Reported on 06/03/2021) 90 tablet 3 Not Taking   valACYclovir (VALTREX) 1000 MG tablet Take 1 tablet (1,000 mg total) by mouth daily. Daily starting at 36 wks until delivery (Patient not taking: Reported on 05/19/2021) 30 tablet 2      OBJECTIVE  Nursing Evaluation:   BP 116/82 (BP Location: Right Arm)    Pulse (!) 139    Temp 98.5 F (36.9 C) (Oral)    Resp 14    Ht 5\' 2"  (1.575 m)    Wt 70.8 kg    LMP 09/03/2020 (Exact Date)    BMI 28.53 kg/m    Findings:   Reactive NST          ROM plus negative       NST was performed and has been reviewed by me.  NST INTERPRETATION: Category I  Mode:  External Baseline Rate (A): 135 bpm Variability: Moderate Accelerations: 15 x 15 Decelerations: None     Contraction Frequency (min): 2-5  ASSESSMENT Impression:  1.  Pregnancy:  G1P0000 at 102w1d , EDD Estimated Date of Delivery: 06/23/21 2.  Reassuring fetal and maternal status 3.  Reactive NST 4. intact membranes 5. Tachycardia noted , pt has seen cardiology and given prescription for propanolol. She has not taken it because she is afraid of the potential side effect. She agree to dose prior to discharge.    PLAN 1. Current condition and above findings reviewed.  Reassuring fetal and maternal condition. 2. Discharge home with standard labor precautions given to return to L&D or call the office for problems. Use propanolol as ordered by cardiologist.  3. Continue routine prenatal care.  Philip Aspen, CNM

## 2021-06-03 NOTE — OB Triage Note (Signed)
Pt is a 31yo G1P0 at [redacted]w[redacted]d that presents frfom ED with c/o LOF and decreased FM. Pt states that when she bent over to pick up laundry this morning she had a large gush of clear odorless fluid. Pt states she has not felt baby move much since last night but has felt lots of movement since her arrival to the unit. Pt denies VB, but states she has had irregular braxton hicks ctxs. EFM applied and initial fht 1354 and assessing.

## 2021-06-03 NOTE — Progress Notes (Signed)
CNM in department. NST reviewed and lab results normal. CNM notified of pt tachycardia and refusal to take prescribed Propranolol. Pt agreed to take propranolol and be monitored before discharge home.

## 2021-06-03 NOTE — OB Triage Note (Signed)
Patient Discharged home per provider. Pt educated about labor precautions and informed when to return to the ED for further evaluation. Pt instructed to keep all follow up appointments with her provider. AVS given to patient and RN answered all questions and patient has no further questions at this time. Pt discharged home in stable condition.

## 2021-06-06 ENCOUNTER — Encounter: Payer: Self-pay | Admitting: Certified Nurse Midwife

## 2021-06-06 ENCOUNTER — Ambulatory Visit (INDEPENDENT_AMBULATORY_CARE_PROVIDER_SITE_OTHER): Payer: No Typology Code available for payment source | Admitting: Certified Nurse Midwife

## 2021-06-06 ENCOUNTER — Other Ambulatory Visit: Payer: Self-pay | Admitting: Certified Nurse Midwife

## 2021-06-06 ENCOUNTER — Other Ambulatory Visit: Payer: Self-pay

## 2021-06-06 VITALS — BP 112/74 | HR 112 | Wt 158.1 lb

## 2021-06-06 DIAGNOSIS — Z3A37 37 weeks gestation of pregnancy: Secondary | ICD-10-CM

## 2021-06-06 DIAGNOSIS — Z3403 Encounter for supervision of normal first pregnancy, third trimester: Secondary | ICD-10-CM

## 2021-06-06 NOTE — Patient Instructions (Signed)
Braxton Hicks Contractions Contractions of the uterus can occur throughout pregnancy, but they are not always a sign that you are in labor. You may have practice contractions called Braxton Hicks contractions. These false labor contractions are sometimes confused with true labor. What are [place] contractions? Braxton Hicks contractions are tightening movements that occur in the muscles of the uterus before labor. Unlike true labor contractions, these contractions do not result in opening (dilation) and thinning of the lowest part of the uterus (cervix). Toward the end of pregnancy (32-34 weeks), Braxton Hicks contractions can happen more often and may become stronger. These contractions are sometimes difficult to tell apart from true labor because they can be very uncomfortable. How to tell the difference between true labor and false labor True labor Contractions last 30-70 seconds. Contractions become very regular. Discomfort is usually felt in the top of the uterus, and it spreads to the lower abdomen and low back. Contractions do not go away with walking. Contractions usually become stronger and more frequent. The cervix dilates and gets thinner. False labor Contractions are usually shorter, weaker, and farther apart than true labor contractions. Contractions are usually irregular. Contractions are often felt in the front of the lower abdomen and in the groin. Contractions may go away when you walk around or change positions while lying down. The cervix usually does not dilate or become thin. Sometimes, the only way to tell if you are in true labor is for your health care provider to look for changes in your cervix. Your health care provider will do a physical exam and may monitor your contractions. If you are in true labor, your health care provider will send you home with instructions about when to return to the hospital. You may continue to have Braxton Hicks contractions until you  go into true labor. Follow these instructions at home:  Take over-the-counter and prescription medicines only as told by your health care provider. If Braxton Hicks contractions are making you uncomfortable: Change your position from lying down or resting to walking, or change from walking to resting. Sit and rest in a tub of warm water. Drink enough fluid to keep your urine pale yellow. Dehydration may cause these contractions. Do slow and deep breathing several times an hour. Keep all follow-up visits. This is important. Contact a health care provider if: You have a fever. You have continuous pain in your abdomen. Your contractions become stronger, more regular, and closer together. You pass blood-tinged mucus. Get help right away if: You have fluid leaking or gushing from your vagina. You have bright red blood coming from your vagina. Your baby is not moving inside you as much as it used to. Summary You may have practice contractions called Braxton Hicks contractions. These false labor contractions are sometimes confused with true labor. Braxton Hicks contractions are usually shorter, weaker, farther apart, and less regular than true labor contractions. True labor contractions usually become stronger, more regular, and more frequent. Manage discomfort from [facility] contractions by changing position, resting in a warm bath, practicing deep breathing, and drinking plenty of water. Keep all follow-up visits. Contact your health care provider if your contractions become stronger, more regular, and closer together. This information is not intended to replace advice given to you by your health care provider. Make sure you discuss any questions you have with your health care provider. Document Revised: 02/23/2020 Document Reviewed: 02/23/2020 Elsevier Patient Education  2022 [employer].

## 2021-06-06 NOTE — Progress Notes (Signed)
ROB doing well, feeling good movement. Was in the hospital recently for c/o leaking of fluid and decreased fetal movement. Since then noted good fetal movement.  She has been taking propranolol this week as ordered. HR today is better 110s. SVE per pt request 1/high/thick

## 2021-06-10 ENCOUNTER — Encounter: Payer: Self-pay | Admitting: Cardiovascular Disease

## 2021-06-13 ENCOUNTER — Ambulatory Visit (INDEPENDENT_AMBULATORY_CARE_PROVIDER_SITE_OTHER): Payer: No Typology Code available for payment source

## 2021-06-13 ENCOUNTER — Other Ambulatory Visit: Payer: Self-pay

## 2021-06-13 DIAGNOSIS — U099 Post covid-19 condition, unspecified: Secondary | ICD-10-CM | POA: Diagnosis not present

## 2021-06-13 DIAGNOSIS — I479 Paroxysmal tachycardia, unspecified: Secondary | ICD-10-CM

## 2021-06-13 DIAGNOSIS — R002 Palpitations: Secondary | ICD-10-CM

## 2021-06-13 DIAGNOSIS — Z3A35 35 weeks gestation of pregnancy: Secondary | ICD-10-CM | POA: Diagnosis not present

## 2021-06-13 LAB — ECHOCARDIOGRAM COMPLETE
AR max vel: 2.37 cm2
AV Area VTI: 2.23 cm2
AV Area mean vel: 2.41 cm2
AV Mean grad: 3 mmHg
AV Peak grad: 7.1 mmHg
AV Vena cont: 0.4 cm
Ao pk vel: 1.33 m/s
Area-P 1/2: 5.75 cm2
Calc EF: 65.2 %
S' Lateral: 2.4 cm
Single Plane A2C EF: 64 %
Single Plane A4C EF: 63.2 %

## 2021-06-15 ENCOUNTER — Encounter: Payer: Self-pay | Admitting: Obstetrics

## 2021-06-15 ENCOUNTER — Ambulatory Visit (INDEPENDENT_AMBULATORY_CARE_PROVIDER_SITE_OTHER): Payer: No Typology Code available for payment source | Admitting: Obstetrics

## 2021-06-15 ENCOUNTER — Other Ambulatory Visit: Payer: Self-pay

## 2021-06-15 VITALS — BP 105/68 | HR 121 | Wt 157.9 lb

## 2021-06-15 DIAGNOSIS — Z3A38 38 weeks gestation of pregnancy: Secondary | ICD-10-CM

## 2021-06-15 DIAGNOSIS — Z3403 Encounter for supervision of normal first pregnancy, third trimester: Secondary | ICD-10-CM

## 2021-06-15 LAB — POCT URINALYSIS DIPSTICK OB
Bilirubin, UA: NEGATIVE
Blood, UA: NEGATIVE
Glucose, UA: NEGATIVE
Ketones, UA: NEGATIVE
Leukocytes, UA: NEGATIVE
Nitrite, UA: NEGATIVE
Spec Grav, UA: 1.025 (ref 1.010–1.025)
Urobilinogen, UA: 0.2 E.U./dL
pH, UA: 6.5 (ref 5.0–8.0)

## 2021-06-15 NOTE — Progress Notes (Signed)
ROB at [redacted]w[redacted]d. Active baby. Denies LOF, vaginal bleeding, and contractions. She has been having some BH contractions. She reports that she woke up this morning feeling lightheaded and her heart has been racing. She has taken propranolol 3x today with no relief. She denies chest pain and SOB. She felt slightly better after eating today, but is feeling unwell again. Recommended calling cardiologist today to make an appointment ASAP. Advised to report to the ED if she starts experiencing chest pain, SOB, or syncope. Encouraged rest and hydration. Reviewed signs of labor and when to go to the hospital. She would like to schedule an IOL at 41 weeks if she is still pregnant. RTC in one week for ROB and NST.  Lloyd Huger, CNM

## 2021-06-15 NOTE — Progress Notes (Signed)
ROB: She is complaining of dizziness, nausea and shaking. She has increased heart rate. She has tried treating hr with propanolol with no relief. She said she just does not feel good today.

## 2021-06-16 NOTE — Progress Notes (Signed)
Cardiology Office Note  Date:  06/17/2021   ID:  Elizabeth Berger, DOB Apr 21, 1991, MRN [id number]  PCP:  Berger, Elizabeth Glow, MD   Chief Complaint  Patient presents with   Tachycardia    Patient c/o tachycardia on Wednesday; propranolol was not working. Patient c/o shortness of breath, chest tightness and tachycardia. Medications reviewed by the patient verbally.     HPI:  [name] is a 31 year old woman with no prior cardiac history Who presents for follow-up of her tachycardia/palpitations, near-syncope  Last seen in clinic May 19, 2021  Echocardiogram fibber 13 2023, normal study  [redacted] weeks pregnant Delivery date February 23  On prior clinic visit reported more persistent tachycardia, palpitations symptoms Possible vasovagal spell in mid January 2023 Orthostatics done at that time, no significant drop in pressures but did have elevated heart rate on standing  Works in office, cancer center  Echocardiogram performed, dated June 13, 2021  normal study  Reports when she gets severe abdominal cramping, will sometimes develop tightness in the chest  EKG personally reviewed by myself on todays visit Sinus tachycardia rate 120 bpm   Other past medical history reviewed Recent episode mid January 2023, She was very busy doing activities at home Got very hot Had episodes of severe abdominal cramping, felt like a bowel movement, attempted to go to the bathroom Had urge to vomit, got diaphoretic dizzy She lay down on the floor, boyfriend and her, gave her some water, after several minutes started to feel better   PMH:   has a past medical history of FH: migraines, Herpes, and Migraines.  PSH:    Past Surgical History:  Procedure Laterality Date   TONSILLECTOMY      Current Outpatient Medications  Medication Sig Dispense Refill   omeprazole (PRILOSEC) 20 MG capsule Take 1 capsule (20 mg total) by mouth daily. 30 capsule 3   ondansetron (ZOFRAN  ODT) 4 MG disintegrating tablet Take 1 tablet (4 mg total) by mouth every 8 (eight) hours as needed for nausea or vomiting. 20 tablet 0   Prenatal Vit-Fe Fumarate-FA (PRENATAL PO) Take by mouth.     propranolol (INDERAL) 10 MG tablet 10 to 20 mg up to three times a day sparingly as needed for tachycardia 90 tablet 3   valACYclovir (VALTREX) 1000 MG tablet Take 1 tablet (1,000 mg total) by mouth daily. 90 tablet 3   No current facility-administered medications for this visit.     Allergies:   Patient has no known allergies.   Social History:  The patient  reports that she has quit smoking. She has never used smokeless tobacco. She reports that she does not currently use alcohol. She reports that she does not currently use drugs.   Family History:   family history includes Arthritis in her mother; Cancer in her maternal grandfather; Cancer (age of onset: 43) in her maternal grandmother; Depression in her maternal grandmother and mother; Hearing loss in her maternal grandfather.    Review of Systems: Review of Systems  Constitutional: Negative.   HENT: Negative.    Respiratory: Negative.    Cardiovascular: Negative.   Gastrointestinal: Negative.   Musculoskeletal: Negative.   Neurological: Negative.   Psychiatric/Behavioral: Negative.    All other systems reviewed and are negative.   PHYSICAL EXAM: VS:  BP 100/80 (BP Location: Left Arm, Patient Position: Sitting, Cuff Size: Normal)    Pulse (!) 127    Ht 5\' 2"  (1.575 m)    Wt 156 lb 6  oz (70.9 kg)    LMP 09/03/2020 (Exact Date)    SpO2 99%    BMI 28.60 kg/m  , BMI Body mass index is 28.6 kg/m. GEN: Well nourished, well developed, in no acute distress HEENT: normal Neck: no JVD, carotid bruits, or masses Cardiac: Tachycardic, regular; no murmurs, rubs, or gallops,no edema  Respiratory:  clear to auscultation bilaterally, normal work of breathing GI: soft, nontender, nondistended, + BS MS: no deformity or atrophy Skin: warm and  dry, no rash Neuro:  Strength and sensation are intact Psych: euthymic mood, full affect  Recent Labs: 04/04/2021: Hemoglobin 12.5; Platelets 306 05/19/2021: TSH 2.700    Lipid Panel Lab Results  Component Value Date   CHOL 191 02/14/2019   HDL 76 02/14/2019   LDLCALC 103 (H) 02/14/2019   TRIG 67 02/14/2019      Wt Readings from Last 3 Encounters:  06/17/21 156 lb 6 oz (70.9 kg)  06/15/21 157 lb 14.4 oz (71.6 kg)  06/06/21 158 lb 1.6 oz (71.7 kg)     ASSESSMENT AND PLAN:  Problem List Items Addressed This Visit   None Visit Diagnoses     Palpitations    -  Primary   Relevant Orders   EKG 12-Lead   Post covid-19 condition, unspecified       [redacted] weeks gestation of pregnancy       Relevant Orders   EKG 12-Lead   Paroxysmal tachycardia (HCC)       Relevant Orders   EKG 12-Lead      Sinus tachycardia In hopefully the last week of pregnancy scheduled to potentially deliver in 6 days time if on time Recommended she work from home if possible in an effort to avoid stress, having to walk long distances She is currently taking propranolol 10 mg 4 times daily She could continue this regiment plus additional 10 mg as needed for breakthrough heart rate greater than 130 or whenever symptomatic No further cardiac work-up needed, normal echocardiogram Recommend she stay hydrated  Near syncope Prior episode what sounds like vasovagal mid January in the setting of pain For additional episodes of abdominal pain with associated diaphoresis/dizziness, recommend she lay on the ground, hydrate  History of COVID-19 Reports having COVID in early winter 2022 Normal echocardiogram   Total encounter time more than 30 minutes  Greater than 50% was spent in counseling and coordination of care with the patient   Signed, Esmond Plants, M.D., Ph.D. [place], [place]

## 2021-06-17 ENCOUNTER — Other Ambulatory Visit: Payer: Self-pay

## 2021-06-17 ENCOUNTER — Encounter: Payer: Self-pay | Admitting: Cardiovascular Disease

## 2021-06-17 ENCOUNTER — Ambulatory Visit (INDEPENDENT_AMBULATORY_CARE_PROVIDER_SITE_OTHER): Payer: No Typology Code available for payment source | Admitting: Cardiovascular Disease

## 2021-06-17 VITALS — BP 100/80 | HR 127 | Ht 62.0 in | Wt 156.4 lb

## 2021-06-17 DIAGNOSIS — R002 Palpitations: Secondary | ICD-10-CM | POA: Diagnosis not present

## 2021-06-17 DIAGNOSIS — U099 Post covid-19 condition, unspecified: Secondary | ICD-10-CM | POA: Diagnosis not present

## 2021-06-17 DIAGNOSIS — Z3A35 35 weeks gestation of pregnancy: Secondary | ICD-10-CM

## 2021-06-17 DIAGNOSIS — I479 Paroxysmal tachycardia, unspecified: Secondary | ICD-10-CM

## 2021-06-17 NOTE — Patient Instructions (Addendum)
Medication Instructions:  No changes  If you need a refill on your cardiac medications before your next appointment, please call your pharmacy.   Lab work: No new labs needed  Testing/Procedures: No new testing needed  Follow-Up: At [facility], you and your health needs are our priority.  As part of our continuing mission to provide you with exceptional heart care, we have created designated Provider Care Teams.  These Care Teams include your primary Cardiologist (physician) and Advanced Practice Providers (APPs -  Physician Assistants and Nurse Practitioners) who all work together to provide you with the care you need, when you need it.  You will need a follow up appointment as needed  Providers on your designated Care Team:   Christopher Berge, NP Ryan Dunn, PA-C Cadence Furth, PA-C  COVID-19 Vaccine Information can be found at: [email or web address] For questions related to vaccine distribution or appointments, please email [email or web address] or call [phone].    

## 2021-06-23 ENCOUNTER — Ambulatory Visit (INDEPENDENT_AMBULATORY_CARE_PROVIDER_SITE_OTHER): Payer: No Typology Code available for payment source | Admitting: Obstetrics

## 2021-06-23 ENCOUNTER — Inpatient Hospital Stay: Admit: 2021-06-23 | Payer: Self-pay

## 2021-06-23 ENCOUNTER — Other Ambulatory Visit: Payer: No Typology Code available for payment source

## 2021-06-23 ENCOUNTER — Other Ambulatory Visit: Payer: Self-pay

## 2021-06-23 VITALS — BP 108/72 | HR 114 | Wt 160.5 lb

## 2021-06-23 DIAGNOSIS — Z3403 Encounter for supervision of normal first pregnancy, third trimester: Secondary | ICD-10-CM | POA: Diagnosis not present

## 2021-06-23 NOTE — Progress Notes (Signed)
ROB at 40 weeks. Active baby. RNST today (see note). Having mild BH but no regular contractions Denies LOF, bloody show. Cardiology visit last week. Cardiologist recommended decreasing activity, working from home. Increased PRN propranolol dose. She is only taking it when symptomatic. Would like IOL by 41 weeks. Requests SVE today: 2/60/-3. Awaiting IOL date from L&D. Will schedule return office visit depending on when her induction is scheduled.  Lloyd Huger, CNM

## 2021-06-24 ENCOUNTER — Other Ambulatory Visit: Payer: Self-pay | Admitting: Obstetrics

## 2021-06-24 ENCOUNTER — Encounter: Payer: Self-pay | Admitting: Obstetrics

## 2021-06-27 ENCOUNTER — Other Ambulatory Visit: Payer: Self-pay

## 2021-06-27 ENCOUNTER — Other Ambulatory Visit
Admission: RE | Admit: 2021-06-27 | Discharge: 2021-06-27 | Disposition: A | Discharge status: Home / Self Care | Payer: No Typology Code available for payment source | Source: Ambulatory Visit | Attending: Obstetrics | Admitting: Obstetrics

## 2021-06-27 ENCOUNTER — Encounter: Payer: Self-pay | Admitting: Obstetrics

## 2021-06-27 DIAGNOSIS — Z20822 Contact with and (suspected) exposure to covid-19: Secondary | ICD-10-CM

## 2021-06-27 DIAGNOSIS — Z01812 Encounter for preprocedural laboratory examination: Secondary | ICD-10-CM | POA: Insufficient documentation

## 2021-06-27 LAB — SARS CORONAVIRUS 2 (TAT 6-24 HRS): SARS Coronavirus 2: NEGATIVE

## 2021-06-29 ENCOUNTER — Encounter: Payer: Self-pay | Admitting: Obstetrics and Gynecology

## 2021-06-29 ENCOUNTER — Other Ambulatory Visit: Payer: Self-pay

## 2021-06-29 ENCOUNTER — Inpatient Hospital Stay: Payer: No Typology Code available for payment source | Admitting: Anesthesiology

## 2021-06-29 ENCOUNTER — Other Ambulatory Visit: Payer: Self-pay | Admitting: Obstetrics

## 2021-06-29 ENCOUNTER — Inpatient Hospital Stay
Admission: EM | Admit: 2021-06-29 | Discharge: 2021-07-02 | DRG: 806 | Disposition: A | Discharge status: Home / Self Care | Payer: No Typology Code available for payment source | Attending: Obstetrics | Admitting: Obstetrics

## 2021-06-29 DIAGNOSIS — O99892 Other specified diseases and conditions complicating childbirth: Secondary | ICD-10-CM | POA: Diagnosis present

## 2021-06-29 DIAGNOSIS — O9832 Other infections with a predominantly sexual mode of transmission complicating childbirth: Secondary | ICD-10-CM | POA: Diagnosis present

## 2021-06-29 DIAGNOSIS — R339 Retention of urine, unspecified: Secondary | ICD-10-CM | POA: Diagnosis not present

## 2021-06-29 DIAGNOSIS — R200 Anesthesia of skin: Secondary | ICD-10-CM | POA: Diagnosis not present

## 2021-06-29 DIAGNOSIS — Z8616 Personal history of COVID-19: Secondary | ICD-10-CM

## 2021-06-29 DIAGNOSIS — R000 Tachycardia, unspecified: Secondary | ICD-10-CM | POA: Diagnosis present

## 2021-06-29 DIAGNOSIS — Z20822 Contact with and (suspected) exposure to covid-19: Secondary | ICD-10-CM | POA: Diagnosis present

## 2021-06-29 DIAGNOSIS — Z87891 Personal history of nicotine dependence: Secondary | ICD-10-CM | POA: Diagnosis not present

## 2021-06-29 DIAGNOSIS — O99344 Other mental disorders complicating childbirth: Secondary | ICD-10-CM | POA: Diagnosis present

## 2021-06-29 DIAGNOSIS — O9902 Anemia complicating childbirth: Secondary | ICD-10-CM | POA: Diagnosis present

## 2021-06-29 DIAGNOSIS — F411 Generalized anxiety disorder: Secondary | ICD-10-CM | POA: Diagnosis present

## 2021-06-29 DIAGNOSIS — A6000 Herpesviral infection of urogenital system, unspecified: Secondary | ICD-10-CM | POA: Diagnosis present

## 2021-06-29 DIAGNOSIS — O480 Post-term pregnancy: Principal | ICD-10-CM | POA: Diagnosis present

## 2021-06-29 DIAGNOSIS — O99893 Other specified diseases and conditions complicating puerperium: Secondary | ICD-10-CM | POA: Diagnosis not present

## 2021-06-29 DIAGNOSIS — O99345 Other mental disorders complicating the puerperium: Secondary | ICD-10-CM | POA: Diagnosis not present

## 2021-06-29 DIAGNOSIS — O9962 Diseases of the digestive system complicating childbirth: Secondary | ICD-10-CM | POA: Diagnosis present

## 2021-06-29 DIAGNOSIS — D509 Iron deficiency anemia, unspecified: Secondary | ICD-10-CM | POA: Diagnosis present

## 2021-06-29 DIAGNOSIS — K219 Gastro-esophageal reflux disease without esophagitis: Secondary | ICD-10-CM | POA: Diagnosis present

## 2021-06-29 DIAGNOSIS — O26893 Other specified pregnancy related conditions, third trimester: Secondary | ICD-10-CM | POA: Diagnosis present

## 2021-06-29 DIAGNOSIS — Z3A40 40 weeks gestation of pregnancy: Secondary | ICD-10-CM | POA: Diagnosis not present

## 2021-06-29 LAB — CBC
HCT: 34.1 % — ABNORMAL LOW (ref 36.0–46.0)
Hemoglobin: 10.4 g/dL — ABNORMAL LOW (ref 12.0–15.0)
MCH: 26.2 pg (ref 26.0–34.0)
MCHC: 30.5 g/dL (ref 30.0–36.0)
MCV: 85.9 fL (ref 80.0–100.0)
Platelets: 259 10*3/uL (ref 150–400)
RBC: 3.97 MIL/uL (ref 3.87–5.11)
RDW: 14.6 % (ref 11.5–15.5)
WBC: 8.5 10*3/uL (ref 4.0–10.5)
nRBC: 0 % (ref 0.0–0.2)

## 2021-06-29 LAB — RPR: RPR Ser Ql: NONREACTIVE

## 2021-06-29 LAB — ABO/RH: ABO/RH(D): A POS

## 2021-06-29 LAB — TYPE AND SCREEN
ABO/RH(D): A POS
Antibody Screen: NEGATIVE

## 2021-06-29 MED ORDER — ACETAMINOPHEN 325 MG PO TABS
650.0000 mg | ORAL_TABLET | ORAL | Status: DC | PRN
  Filled 2021-06-29: qty 2

## 2021-06-29 MED ORDER — BUTORPHANOL TARTRATE 1 MG/ML IJ SOLN: 1.0000 mg | INTRAMUSCULAR | Status: DC | PRN

## 2021-06-29 MED ORDER — LACTATED RINGERS IV SOLN
500.0000 mL | INTRAVENOUS | Status: DC | PRN
  Administered 2021-06-30: 500 mL via INTRAVENOUS

## 2021-06-29 MED ORDER — MISOPROSTOL 25 MCG QUARTER TABLET
50.0000 ug | ORAL_TABLET | ORAL | Status: DC | PRN
  Administered 2021-06-29 (×3): 50 ug via VAGINAL
  Filled 2021-06-29 (×3): qty 1

## 2021-06-29 MED ORDER — LIDOCAINE HCL (PF) 1 % IJ SOLN
INTRAMUSCULAR | Status: AC
  Filled 2021-06-29: qty 30

## 2021-06-29 MED ORDER — DIPHENHYDRAMINE HCL 50 MG/ML IJ SOLN: 12.5000 mg | INTRAMUSCULAR | Status: DC | PRN

## 2021-06-29 MED ORDER — LORAZEPAM 1 MG PO TABS: 1.0000 mg | ORAL_TABLET | ORAL | Status: DC | PRN

## 2021-06-29 MED ORDER — OXYTOCIN-SODIUM CHLORIDE 30-0.9 UT/500ML-% IV SOLN: 2.5000 [IU]/h | INTRAVENOUS | Status: DC

## 2021-06-29 MED ORDER — MISOPROSTOL 200 MCG PO TABS
ORAL_TABLET | ORAL | Status: AC
  Filled 2021-06-29: qty 4

## 2021-06-29 MED ORDER — LIDOCAINE HCL (PF) 1 % IJ SOLN
30.0000 mL | INTRAMUSCULAR | Status: AC | PRN
  Administered 2021-06-30: 30 mL via SUBCUTANEOUS

## 2021-06-29 MED ORDER — LIDOCAINE HCL (PF) 1 % IJ SOLN
INTRAMUSCULAR | Status: DC | PRN
  Administered 2021-06-29: 3 mL

## 2021-06-29 MED ORDER — FENTANYL-BUPIVACAINE-NACL 0.5-0.125-0.9 MG/250ML-% EP SOLN
12.0000 mL/h | EPIDURAL | Status: DC | PRN
  Administered 2021-06-29: 12 mL/h via EPIDURAL

## 2021-06-29 MED ORDER — FENTANYL-BUPIVACAINE-NACL 0.5-0.125-0.9 MG/250ML-% EP SOLN
EPIDURAL | Status: AC
  Filled 2021-06-29: qty 250

## 2021-06-29 MED ORDER — OXYTOCIN BOLUS FROM INFUSION
333.0000 mL | Freq: Once | INTRAVENOUS | Status: AC
  Administered 2021-06-30: 333 mL via INTRAVENOUS

## 2021-06-29 MED ORDER — SOD CITRATE-CITRIC ACID 500-334 MG/5ML PO SOLN
30.0000 mL | ORAL | Status: DC | PRN
Start: 2021-06-29 — End: 2021-06-30

## 2021-06-29 MED ORDER — ONDANSETRON HCL 4 MG/2ML IJ SOLN
4.0000 mg | Freq: Four times a day (QID) | INTRAMUSCULAR | Status: DC | PRN
  Administered 2021-06-29 – 2021-06-30 (×4): 4 mg via INTRAVENOUS
  Filled 2021-06-29 (×4): qty 2

## 2021-06-29 MED ORDER — EPHEDRINE 5 MG/ML INJ
10.0000 mg | INTRAVENOUS | Status: DC | PRN
  Filled 2021-06-29: qty 2

## 2021-06-29 MED ORDER — OXYTOCIN-SODIUM CHLORIDE 30-0.9 UT/500ML-% IV SOLN
INTRAVENOUS | Status: AC
  Filled 2021-06-29: qty 500

## 2021-06-29 MED ORDER — PROPRANOLOL HCL 10 MG PO TABS
10.0000 mg | ORAL_TABLET | Freq: Three times a day (TID) | ORAL | Status: DC | PRN
  Administered 2021-06-29 – 2021-06-30 (×3): 10 mg via ORAL
  Filled 2021-06-29 (×4): qty 2

## 2021-06-29 MED ORDER — PHENYLEPHRINE 40 MCG/ML (10ML) SYRINGE FOR IV PUSH (FOR BLOOD PRESSURE SUPPORT)
80.0000 ug | PREFILLED_SYRINGE | INTRAVENOUS | Status: DC | PRN
  Filled 2021-06-29: qty 10

## 2021-06-29 MED ORDER — OXYTOCIN-SODIUM CHLORIDE 30-0.9 UT/500ML-% IV SOLN
1.0000 m[IU]/min | INTRAVENOUS | Status: DC
  Administered 2021-06-29: 2 m[IU]/min via INTRAVENOUS
  Filled 2021-06-29: qty 500

## 2021-06-29 MED ORDER — LACTATED RINGERS IV SOLN
INTRAVENOUS | Status: DC
Start: 2021-06-29 — End: 2021-06-30
  Administered 2021-06-29: 1000 mL via INTRAVENOUS

## 2021-06-29 MED ORDER — HYDROXYZINE HCL 25 MG PO TABS
50.0000 mg | ORAL_TABLET | Freq: Four times a day (QID) | ORAL | Status: DC | PRN
  Filled 2021-06-29: qty 1

## 2021-06-29 MED ORDER — OXYTOCIN 10 UNIT/ML IJ SOLN
INTRAMUSCULAR | Status: AC
  Filled 2021-06-29: qty 2

## 2021-06-29 MED ORDER — LIDOCAINE-EPINEPHRINE (PF) 1.5 %-1:200000 IJ SOLN
INTRAMUSCULAR | Status: DC | PRN
  Administered 2021-06-29: 3 mL via PERINEURAL

## 2021-06-29 MED ORDER — LACTATED RINGERS IV SOLN
500.0000 mL | Freq: Once | INTRAVENOUS | Status: AC
  Administered 2021-06-29: 500 mL via INTRAVENOUS

## 2021-06-29 MED ORDER — SODIUM CHLORIDE 0.9 % IV SOLN
INTRAVENOUS | Status: DC | PRN
  Administered 2021-06-29 (×2): 3 mL via EPIDURAL

## 2021-06-29 MED ORDER — AMMONIA AROMATIC IN INHA
RESPIRATORY_TRACT | Status: AC
  Filled 2021-06-29: qty 10

## 2021-06-29 NOTE — Anesthesia Preprocedure Evaluation (Signed)
Anesthesia Evaluation  ?Patient identified by MRN, date of birth, ID band ?Patient awake ? ? ? ?Reviewed: ?Allergy & Precautions, H&P , NPO status , Patient's Chart, lab work & pertinent test results ? ?Airway ?Mallampati: II ? ? ?Neck ROM: full ? ? ? Dental ?no notable dental hx. ? ?  ?Pulmonary ?former smoker,  ?  ?Pulmonary exam normal ? ? ? ? ? ? ? Cardiovascular ?Normal cardiovascular exam ? ?Tachycardia with pregnancy ?  ?Neuro/Psych ? Headaches, Anxiety   ? GI/Hepatic ?Neg liver ROS, GERD  Controlled,  ?Endo/Other  ?negative endocrine ROS ? Renal/GU ?Renal diseaseKidney Stones  ?negative genitourinary ?  ?Musculoskeletal ? ? Abdominal ?  ?Peds ? Hematology ?negative hematology ROS ?(+)   ?Anesthesia Other Findings ? ? Reproductive/Obstetrics ?(+) Pregnancy ? ?  ? ? ? ? ? ? ? ? ? ? ? ? ? ?  ?  ? ? ? ? ? ? ? ? ?Anesthesia Physical ?Anesthesia Plan ? ?ASA: 2 ? ?Anesthesia Plan: Epidural  ? ?Post-op Pain Management:   ? ?Induction:  ? ?PONV Risk Score and Plan:  ? ?Airway Management Planned:  ? ?Additional Equipment:  ? ?Intra-op Plan:  ? ?Post-operative Plan:  ? ?Informed Consent: I have reviewed the patients History and Physical, chart, labs and discussed the procedure including the risks, benefits and alternatives for the proposed anesthesia with the patient or authorized representative who has indicated his/her understanding and acceptance.  ? ? ? ? ? ?Plan Discussed with: Anesthesiologist and CRNA ? ?Anesthesia Plan Comments:   ? ? ? ? ? ? ?Anesthesia Quick Evaluation ? ?

## 2021-06-29 NOTE — Progress Notes (Signed)
LABOR NOTE  ? ?SUBJECTIVE:  ? ?Elizabeth Berger is a 31 y.o.GP@ at [redacted]w[redacted]d, here for induction of labor. Comfortable with epidural. Minimal cervical change over the past 2 hours. ? ?Analgesia: Epidural ? ?OBJECTIVE:  ?BP 112/72   Pulse 96   Temp 98.3 ?F (36.8 ?C) (Oral)   Resp 16   Ht 5\' 2"  (1.575 m)   Wt 72.6 kg   LMP 09/03/2020 (Exact Date)   SpO2 100%   BMI 29.26 kg/m?  ?No intake/output data recorded. ? ?SVE:   Dilation: 3 ?Effacement (%): 60 ?Station: -2 ?Exam by:: Martinique Guptill RN ?CONTRACTIONS: regular, every 2-3 minutes ?FHR: Fetal heart tracing reviewed. ?Baseline: 130 ?Variability: moderate ?Accelerations: present, 15x15 ?Decelerations:none ?Category 1 ? ?Labs: ?Lab Results  ?Component Value Date  ? WBC 8.5 06/29/2021  ? HGB 10.4 (L) 06/29/2021  ? HCT 34.1 (L) 06/29/2021  ? MCV 85.9 06/29/2021  ? PLT 259 06/29/2021  ? ? ?ASSESSMENT: ?1)  Induction of labor ?    Coping: well ?    Membranes: intact ? ?Principal Problem: ?  Labor and delivery, indication for care ? ? ?PLAN: ?IV Pitocin augmentation ?Anticipate NSVD ? ? ?Lloyd Huger, CNM ?06/29/2021 ?10:47 PM ? ?  ?

## 2021-06-29 NOTE — Progress Notes (Addendum)
LABOR NOTE  ? ?SUBJECTIVE:  ? ?Elizabeth Berger is a 31 y.o.GP@ at [redacted]w[redacted]d here for induction of labor. She has received 2 doses of misoprostol. ? ?Analgesia:  Plans epidural ? ?OBJECTIVE:  ?BP 120/73   Pulse (!) 111   Temp 98.3 ?F (36.8 ?C) (Oral)   Resp 16   Ht 5\' 2"  (1.575 m)   Wt 72.6 kg   LMP 09/03/2020 (Exact Date)   BMI 29.26 kg/m?  ?No intake/output data recorded. ? ?SVE:   Dilation: 2 ?Effacement (%): 50 ?Station: -3 ?Exam by:: Maryfrances Bunnell, RN ?CONTRACTIONS: q2-4 minutes, mild ?FHR: Fetal heart tracing reviewed. ?Baseline: 145 ?Variability: moderate ?Accelerations: present ?Decelerations:none ?Category 1 ? ?Labs: ?Lab Results  ?Component Value Date  ? WBC 8.5 06/29/2021  ? HGB 10.4 (L) 06/29/2021  ? HCT 34.1 (L) 06/29/2021  ? MCV 85.9 06/29/2021  ? PLT 259 06/29/2021  ? ? ?ASSESSMENT: ?1)  Cervical ripening ?    Coping: well. Pt is aware that PRN anxiety medications available. ?    Membranes: intact ?     ?    Principal Problem: ?  Labor and delivery, indication for care ? ? ?PLAN: ?Continue present management ?Consider Pitocin/AROM when appropriate ?Anticipate NSVD ?Dr. Amalia Hailey updated ? ? ?Elizabeth Berger, CNM ?06/29/2021 ?1:04 PM ? ?  ?

## 2021-06-29 NOTE — Progress Notes (Signed)
LABOR NOTE  ? ?SUBJECTIVE:  ? ?Elizabeth Berger is a 31 y.o.GP@ at [redacted]w[redacted]d here for induction of labor. She has received 3 doses of misoprostol and is having painful contractions. ? ?Analgesia: Epidural ? ?OBJECTIVE:  ?BP 126/71   Pulse 95   Temp 98.3 ?F (36.8 ?C) (Oral)   Resp 16   Ht 5\' 2"  (1.575 m)   Wt 72.6 kg   LMP 09/03/2020 (Exact Date)   SpO2 99%   BMI 29.26 kg/m?  ?No intake/output data recorded. ? ?SVE:   Dilation: 2 ?Effacement (%): 50 ?Station: -3 ?Exam by:: Maryfrances Bunnell, RN ?CONTRACTIONS: regular, every 1-2 minutes ?FHR: Fetal heart tracing reviewed. ?Baseline: 150 ?Variability: moderate ?Accelerations: present, 15x15 ?Decelerations:none ?Category 1 ? ?Labs: ?Lab Results  ?Component Value Date  ? WBC 8.5 06/29/2021  ? HGB 10.4 (L) 06/29/2021  ? HCT 34.1 (L) 06/29/2021  ? MCV 85.9 06/29/2021  ? PLT 259 06/29/2021  ? ? ?ASSESSMENT: ?1)  Cervical ripening ?    Coping well. Repositioning to improve epidural pain relief. ?    Membranes: intact ?     ?    ? ?Principal Problem: ?  Labor and delivery, indication for care ? ? ?PLAN: ?Continue present managemeent ?Consider Foley balloon/Cooks when comfortable ?Consider Pitocin/AROM when appropriate ?Anticipate NSVD ? ? ?Lloyd Huger, CNM ?06/29/2021 ?6:19 PM ? ?  ?

## 2021-06-29 NOTE — Progress Notes (Signed)
LABOR NOTE  ? ?SUBJECTIVE:  ? ?Elizabeth Berger is a 31 y.o.GP@ at [redacted]w[redacted]d here for induction of labor. She is contracting regularly after 3 doses of misoprostol and making cervical change. She is comfortable now with her epidural. ? ?Analgesia: Epidural ? ?OBJECTIVE:  ?BP 112/72   Pulse 96   Temp 98.3 ?F (36.8 ?C) (Oral)   Resp 16   Ht 5\' 2"  (1.575 m)   Wt 72.6 kg   LMP 09/03/2020 (Exact Date)   SpO2 100%   BMI 29.26 kg/m?  ?No intake/output data recorded. ? ?SVE:   Dilation: 3 ?Effacement (%): 60 ?Station: -3 ?Exam by:: Norberto Sorenson CNM ?CONTRACTIONS: regular, every 2-3 minutes ?FHR: Fetal heart tracing reviewed. ?Baseline: 145 ?Variability: moderate ?Accelerations: present ?Decelerations:none ?Category 1 ? ?Labs: ?Lab Results  ?Component Value Date  ? WBC 8.5 06/29/2021  ? HGB 10.4 (L) 06/29/2021  ? HCT 34.1 (L) 06/29/2021  ? MCV 85.9 06/29/2021  ? PLT 259 06/29/2021  ? ? ?ASSESSMENT: ?1)  Cervical ripening/induction of labor ?    Coping: well ?    Membranes: intact intact ?     ?  Principal Problem: ?  Labor and delivery, indication for care ? ? ?PLAN: ?-Continue present management ?-Frequent position changes  ?-Re-evaluate in ~2 hours ?-Consider Pitocin/AROM if no cervical change ?-Anticipate NSVD ?-Dr. Amalia Hailey updated ? ?Lloyd Huger, CNM ?06/29/2021 ?8:36 PM ? ?  ?

## 2021-06-29 NOTE — Progress Notes (Signed)
?   06/29/21 1200  ?Clinical Encounter Type  ?Visited With Patient  ?Visit Type Initial  ?Referral From Nurse  ?Consult/Referral To Chaplain  ? ?Chaplain responded to nurse consult. Patient declined Chaplain services. Chaplain offered continued care if patient should request in the future. ?

## 2021-06-29 NOTE — Anesthesia Procedure Notes (Signed)
Epidural ?Patient location during procedure: OB ?Start time: 06/29/2021 5:20 PM ?End time: 06/29/2021 5:46 PM ? ?Staffing ?Anesthesiologist: Martha Clan, MD ?Resident/CRNA: Tollie Eth, CRNA ?Performed: resident/CRNA  ? ?Preanesthetic Checklist ?Completed: patient identified, IV checked, site marked, risks and benefits discussed, surgical consent, monitors and equipment checked, pre-op evaluation and timeout performed ? ?Epidural ?Patient position: sitting ?Prep: ChloraPrep ?Patient monitoring: heart rate, continuous pulse ox and blood pressure ?Approach: midline ?Location: L3-L4 ?Injection technique: LOR saline ? ?Needle:  ?Needle type: Tuohy  ?Needle gauge: 17 G ?Needle length: 9 cm and 9 ?Needle insertion depth: 7 cm ?Catheter type: closed end flexible ?Catheter size: 19 Gauge ?Catheter at skin depth: 12 cm ?Test dose: negative and 1.5% lidocaine with Epi 1:200 K ? ?Assessment ?Sensory level: T10 ?Events: blood not aspirated, injection not painful, no injection resistance, no paresthesia and negative IV test ? ?Additional Notes ?1 attempt ?Pt. Evaluated and documentation done after procedure finished. ?Patient identified. Risks/Benefits/Options discussed with patient including but not limited to bleeding, infection, nerve damage, paralysis, failed block, incomplete pain control, headache, blood pressure changes, nausea, vomiting, reactions to medication both or allergic, itching and postpartum back pain. Confirmed with bedside nurse the patient's most recent platelet count. Confirmed with patient that they are not currently taking any anticoagulation, have any bleeding history or any family history of bleeding disorders. Patient expressed understanding and wished to proceed. All questions were answered. Sterile technique was used throughout the entire procedure. Please see nursing notes for vital signs. Test dose was given through epidural catheter and negative prior to continuing to dose epidural or start  infusion. Warning signs of high block given to the patient including shortness of breath, tingling/numbness in hands, complete motor block, or any concerning symptoms with instructions to call for help. Patient was given instructions on fall risk and not to get out of bed. All questions and concerns addressed with instructions to call with any issues or inadequate analgesia.   ? ?Patient tolerated the insertion well without immediate complications.Reason for block:procedure for pain ? ? ? ?

## 2021-06-29 NOTE — H&P (Signed)
?History and Physical ? ? ?HPI ? ?Elizabeth Berger is a 31 y.o. G1P0000 at [redacted]w[redacted]d Estimated Date of Delivery: 06/23/21 who is being admitted for induction of labor. Her pregnancy has been complicated by tachycardia, and she has been followed up cardiology for this. She denies contractions, leaking of fluid, and vaginal bleeding. ? ? ?OB History ? ?OB History  ?Gravida Para Term Preterm AB Living  ?1 0 0 0 0 0  ?SAB IAB Ectopic Multiple Live Births  ?0 0 0 0 0  ?  ?# Outcome Date GA Lbr Len/2nd Weight Sex Delivery Anes PTL Lv  ?1 Current           ? ? ?PROBLEM LIST ? ?Pregnancy complications or risks: ?Patient Active Problem List  ? Diagnosis Date Noted  ? Labor and delivery, indication for care 06/03/2021  ? COVID-19 affecting pregnancy in second trimester 03/28/2021  ? Back pain affecting pregnancy in second trimester 03/28/2021  ? Kidney stones   ? Herpes 09/01/2019  ? History of kidney stones 04/18/2019  ? Gastroesophageal reflux disease 04/18/2019  ? Migraine without status migrainosus, not intractable 09/25/2017  ? Family history of breast cancer 09/25/2017  ? Family history of ovarian cancer 09/25/2017  ? ? ?Prenatal labs and studies: ?ABO, Rh: --/--/A POS (03/01 0630) ?Antibody: NEG (03/01 0630) ?Rubella: 1.22 (08/02 1115) ?RPR: Non Reactive (12/05 0853)  ?HBsAg: Negative (08/02 1115)  ?HIV: Non Reactive (08/02 1115)  ?WLN:LGXQJJHE/-- (01/27 1617) ? ? ?Past Medical History:  ?Diagnosis Date  ? FH: migraines   ? mom  ? 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 Migraines   ? ? ? ?Past Surgical History:  ?Procedure Laterality Date  ? TONSILLECTOMY    ? ? ? ?Medications   ? ?Current Discharge Medication List  ?  ? ?CONTINUE these medications which have NOT CHANGED  ? Details  ?omeprazole (PRILOSEC) 20 MG capsule Take 1 capsule (20 mg total) by mouth daily. ?Qty: 30 capsule, Refills: 3  ?  ?Prenatal Vit-Fe Fumarate-FA (PRENATAL PO) Take by mouth.  ?  ?propranolol (INDERAL) 10 MG tablet 10 to 20 mg up to three times a day sparingly  as needed for tachycardia ?Qty: 90 tablet, Refills: 3  ?  ?valACYclovir (VALTREX) 1000 MG tablet Take 1 tablet (1,000 mg total) by mouth daily. ?Qty: 90 tablet, Refills: 3  ? Associated Diagnoses: HSV infection  ?  ?ondansetron (ZOFRAN ODT) 4 MG disintegrating tablet Take 1 tablet (4 mg total) by mouth every 8 (eight) hours as needed for nausea or vomiting. ?Qty: 20 tablet, Refills: 0  ?  ?  ? ? ? ?Allergies ? ?Patient has no known allergies. ? ?Review of Systems ? ?Pertinent items noted in HPI and remainder of comprehensive ROS otherwise negative. ? ?Physical Exam ? ?BP 124/81 (BP Location: Left Arm)   Pulse (!) 132   Temp 99 ?F (37.2 ?C) (Oral)   Resp 16   Ht 5\' 2"  (1.575 m)   Wt 72.6 kg   LMP 09/03/2020 (Exact Date)   BMI 29.26 kg/m?  ? ?Lungs:  CTA B ?Cardio: RRR without M/R/G ?Abd: Soft, gravid, NT ?Presentation: cephalic ?CERVIX: Dilation: 2 ?Effacement (%): 50 ?Cervical Position: Posterior ?Station: -3 ?Presentation: Vertex ?Exam by:: Norberto Sorenson, CNM ? ?See Prenatal records for more detailed PE. ? ?  ? ?FHR:  ?Baseline: 145 ?Variability: moderate ?Accelerations: present, 15x15 ?Decelerations: none ?Toco: occasional ?Category 1 ? ?Test Results ? ?Results for orders placed or performed during the hospital encounter of 06/29/21 (  from the past 24 hour(s))  ?CBC     Status: Abnormal  ? Collection Time: 06/29/21  6:30 AM  ?Result Value Ref Range  ? WBC 8.5 4.0 - 10.5 K/uL  ? RBC 3.97 3.87 - 5.11 MIL/uL  ? Hemoglobin 10.4 (L) 12.0 - 15.0 g/dL  ? HCT 34.1 (L) 36.0 - 46.0 %  ? MCV 85.9 80.0 - 100.0 fL  ? MCH 26.2 26.0 - 34.0 pg  ? MCHC 30.5 30.0 - 36.0 g/dL  ? RDW 14.6 11.5 - 15.5 %  ? Platelets 259 150 - 400 K/uL  ? nRBC 0.0 0.0 - 0.2 %  ?Type and screen     Status: None  ? Collection Time: 06/29/21  6:30 AM  ?Result Value Ref Range  ? ABO/RH(D) A POS   ? Antibody Screen NEG   ? Sample Expiration    ?  07/02/2021,2359 ?Performed at [facility], [address] [place], [place] [address] ?  ? ?Group B  Strep negative ? ?Assessment ? ? G1P0000 at [redacted]w[redacted]d Estimated Date of Delivery: 06/23/21  ?Reassuring maternal/fetal status. ? ?Patient Active Problem List  ? Diagnosis Date Noted  ? Labor and delivery, indication for care 06/03/2021  ? COVID-19 affecting pregnancy in second trimester 03/28/2021  ? Back pain affecting pregnancy in second trimester 03/28/2021  ? Kidney stones   ? Herpes 09/01/2019  ? History of kidney stones 04/18/2019  ? Gastroesophageal reflux disease 04/18/2019  ? Migraine without status migrainosus, not intractable 09/25/2017  ? Family history of breast cancer 09/25/2017  ? Family history of ovarian cancer 09/25/2017  ? ? ?Plan ? ?1. Admit to L&D for cervical ripening and induction. Administer cytotec.   ?2. EFM: Category 1 ?3. Pharmacologic pain relief if desired.   ?4. Admission labs  ?5. Anticipate NSVD ?6. MD notified of admission ? ?Lloyd Huger, CNM ?06/29/2021 ?8:04 AM  ?

## 2021-06-30 ENCOUNTER — Encounter: Payer: Self-pay | Admitting: Obstetrics

## 2021-06-30 DIAGNOSIS — O26893 Other specified pregnancy related conditions, third trimester: Secondary | ICD-10-CM

## 2021-06-30 DIAGNOSIS — O480 Post-term pregnancy: Secondary | ICD-10-CM | POA: Diagnosis not present

## 2021-06-30 DIAGNOSIS — O99344 Other mental disorders complicating childbirth: Secondary | ICD-10-CM | POA: Diagnosis not present

## 2021-06-30 DIAGNOSIS — R000 Tachycardia, unspecified: Secondary | ICD-10-CM

## 2021-06-30 DIAGNOSIS — Z3A40 40 weeks gestation of pregnancy: Secondary | ICD-10-CM

## 2021-06-30 MED ORDER — MISOPROSTOL 200 MCG PO TABS
ORAL_TABLET | ORAL | Status: AC
  Filled 2021-06-30: qty 5

## 2021-06-30 MED ORDER — ACETAMINOPHEN 325 MG PO TABS
650.0000 mg | ORAL_TABLET | ORAL | Status: DC | PRN
  Administered 2021-06-30 (×2): 650 mg via ORAL
  Filled 2021-06-30: qty 2

## 2021-06-30 MED ORDER — DEXTROSE 5 % IN LACTATED RINGERS IV BOLUS
500.0000 mL | Freq: Once | INTRAVENOUS | Status: AC
  Administered 2021-06-30: 500 mL via INTRAVENOUS

## 2021-06-30 MED ORDER — ZOLPIDEM TARTRATE 5 MG PO TABS: 5.0000 mg | ORAL_TABLET | Freq: Every evening | ORAL | Status: DC | PRN

## 2021-06-30 MED ORDER — DOCUSATE SODIUM 100 MG PO CAPS
100.0000 mg | ORAL_CAPSULE | Freq: Two times a day (BID) | ORAL | Status: DC
Start: 2021-06-30 — End: 2021-07-02
  Administered 2021-07-01 – 2021-07-02 (×3): 100 mg via ORAL
  Filled 2021-06-30 (×3): qty 1

## 2021-06-30 MED ORDER — METHYLERGONOVINE MALEATE 0.2 MG/ML IJ SOLN
INTRAMUSCULAR | Status: AC
  Administered 2021-06-30: 0.2 mg via INTRAMUSCULAR
  Filled 2021-06-30: qty 1

## 2021-06-30 MED ORDER — TETANUS-DIPHTH-ACELL PERTUSSIS 5-2.5-18.5 LF-MCG/0.5 IM SUSY
0.5000 mL | PREFILLED_SYRINGE | Freq: Once | INTRAMUSCULAR | Status: DC
  Filled 2021-06-30: qty 0.5

## 2021-06-30 MED ORDER — COCONUT OIL OIL
1.0000 | TOPICAL_OIL | Status: DC | PRN
Start: 2021-06-30 — End: 2021-07-02
  Filled 2021-06-30: qty 120

## 2021-06-30 MED ORDER — PRENATAL MULTIVITAMIN CH
1.0000 | ORAL_TABLET | Freq: Every day | ORAL | Status: DC
  Administered 2021-07-01 – 2021-07-02 (×2): 1 via ORAL
  Filled 2021-06-30 (×2): qty 1

## 2021-06-30 MED ORDER — VARICELLA VIRUS VACCINE LIVE 1350 PFU/0.5ML IJ SUSR
0.5000 mL | INTRAMUSCULAR | Status: DC | PRN
  Filled 2021-06-30: qty 0.5

## 2021-06-30 MED ORDER — OXYCODONE-ACETAMINOPHEN 5-325 MG PO TABS: 1.0000 | ORAL_TABLET | ORAL | Status: DC | PRN

## 2021-06-30 MED ORDER — METHYLERGONOVINE MALEATE 0.2 MG PO TABS: 0.2000 mg | ORAL_TABLET | ORAL | Status: DC | PRN

## 2021-06-30 MED ORDER — OXYTOCIN-SODIUM CHLORIDE 30-0.9 UT/500ML-% IV SOLN
2.5000 [IU]/h | INTRAVENOUS | Status: DC | PRN
Start: 2021-06-30 — End: 2021-07-02

## 2021-06-30 MED ORDER — SIMETHICONE 80 MG PO CHEW: 80.0000 mg | CHEWABLE_TABLET | ORAL | Status: DC | PRN

## 2021-06-30 MED ORDER — METHYLERGONOVINE MALEATE 0.2 MG/ML IJ SOLN: 0.2000 mg | Freq: Once | INTRAMUSCULAR | Status: AC

## 2021-06-30 MED ORDER — BENZOCAINE-MENTHOL 20-0.5 % EX AERO
1.0000 | INHALATION_SPRAY | CUTANEOUS | Status: DC | PRN
Start: 2021-06-30 — End: 2021-07-02
  Filled 2021-06-30 (×2): qty 56

## 2021-06-30 MED ORDER — METHYLERGONOVINE MALEATE 0.2 MG/ML IJ SOLN
0.2000 mg | INTRAMUSCULAR | Status: DC | PRN
  Filled 2021-06-30: qty 1

## 2021-06-30 MED ORDER — DIPHENHYDRAMINE HCL 25 MG PO CAPS: 25.0000 mg | ORAL_CAPSULE | Freq: Four times a day (QID) | ORAL | Status: DC | PRN

## 2021-06-30 MED ORDER — ONDANSETRON HCL 4 MG/2ML IJ SOLN: 4.0000 mg | INTRAMUSCULAR | Status: DC | PRN

## 2021-06-30 MED ORDER — CARBOPROST TROMETHAMINE 250 MCG/ML IM SOLN
INTRAMUSCULAR | Status: AC
  Filled 2021-06-30: qty 1

## 2021-06-30 MED ORDER — IBUPROFEN 600 MG PO TABS
600.0000 mg | ORAL_TABLET | Freq: Four times a day (QID) | ORAL | Status: DC
Start: 2021-06-30 — End: 2021-07-02
  Administered 2021-06-30 – 2021-07-02 (×9): 600 mg via ORAL
  Filled 2021-06-30 (×9): qty 1

## 2021-06-30 MED ORDER — ONDANSETRON HCL 4 MG/2ML IJ SOLN
INTRAMUSCULAR | Status: AC
  Administered 2021-06-30: 4 mg via INTRAVENOUS
  Filled 2021-06-30: qty 2

## 2021-06-30 NOTE — Progress Notes (Signed)
Vacuum delivery: ? ?Called to evaluate patient for possible assisted delivery.  Pt had been pushing for >2 hours with slow progress.  Maternal exhaustion and poor expulsive effort.  Epidural was turned down without significant change in effort.   ?Fetal vertex noted on perineum. ?Adequate pelvic room was noted posteriorly. ?Discussed vacuum delivery with parents - consent obtained. ?Bladder emptied. ? ?Hard Kiwi vacuum applied. ?Over the next 3 contractions, vacuum traction was applied with significant decent noted with each pull. The head was noted to be crowning and delivered with good control.   ?Remainder of baby was delivered and place on mother abd per usual. ? ?Please see remainder of delivery note by Norberto Sorenson. ?

## 2021-06-30 NOTE — Progress Notes (Signed)
MD/CNM  discussed delivery with vacuum assist with patient and obtained verbal consent.  Pt verbalized understanding.  ?  Vacuum applied @ 1054, 3 pulls for 30 seconds  ?  ?  delivery of Baby @ 1101 ?

## 2021-06-30 NOTE — Progress Notes (Signed)
LABOR NOTE  ? ?SUBJECTIVE:  ? ?Elizabeth Berger is a 31 y.o.GP@ at [redacted]w[redacted]d here for induction of labor. She is now completely dilated. ? ?Analgesia: Epidural ? ?OBJECTIVE:  ?BP 110/64   Pulse (!) 135   Temp 98.8 ?F (37.1 ?C) (Oral)   Resp 16   Ht 5\' 2"  (1.575 m)   Wt 72.6 kg   LMP 09/03/2020 (Exact Date)   SpO2 96%   BMI 29.26 kg/m?  ?Total I/O ?In: -  ?Out: 1000 [Urine:1000] ? ?SVE:   Dilation: 10 ?Effacement (%): 100 ?Station: 0 ?Exam by:: Norberto Sorenson CNM ?CONTRACTIONS: regular, every 2-3 minutes ?FHR: Fetal heart tracing reviewed. ?Baseline: 145 ?Variability: moderate ?Accelerations: not present ?Decelerations:early ?Category 1 ? ?Labs: ?Lab Results  ?Component Value Date  ? WBC 8.5 06/29/2021  ? HGB 10.4 (L) 06/29/2021  ? HCT 34.1 (L) 06/29/2021  ? MCV 85.9 06/29/2021  ? PLT 259 06/29/2021  ? ? ?ASSESSMENT: ?1)  Induction of labor.Fully dilated. ?    Coping: well ?    Membranes: ruptured, meconium  ?     ?     ?2) Maternal tachycardia ? ?Principal Problem: ?  Labor and delivery, indication for care ? ? ?PLAN: ?Take dose of propranolol ?Begin pushing ?Anticipate NSVD ?Dr. Amalia Hailey notified ? ?Lloyd Huger, CNM ?06/30/2021 ?6:20 AM ? ?  ?

## 2021-06-30 NOTE — Progress Notes (Signed)
LABOR NOTE  ? ?SUBJECTIVE:  ? ?Elizabeth Berger is a 31 y.o.GP@ at [redacted]w[redacted]d here for induction of labor. She is currently on Pitocin, is making cervical change, and has started feeling pressure. She has requested AROM. ? ?Analgesia: Epidural ? ?OBJECTIVE:  ?BP 112/72   Pulse 96   Temp 98.3 ?F (36.8 ?C) (Oral)   Resp 16   Ht 5\' 2"  (1.575 m)   Wt 72.6 kg   LMP 09/03/2020 (Exact Date)   SpO2 100%   BMI 29.26 kg/m?  ?No intake/output data recorded. ? ?SVE:   Dilation: 6 ?Effacement (%): 80 ?Station: 0 ?Exam by:: Martinique Guptill RN ?Bulging bag of water ? ?CONTRACTIONS: regular, every 2-3 minutes ?FHR: Fetal heart tracing reviewed. ?Baseline: 140 ?Variability: moderate ?Accelerations: present, 15x15 ?Decelerations:early ?Category 1 ? ?Labs: ?Lab Results  ?Component Value Date  ? WBC 8.5 06/29/2021  ? HGB 10.4 (L) 06/29/2021  ? HCT 34.1 (L) 06/29/2021  ? MCV 85.9 06/29/2021  ? PLT 259 06/29/2021  ? ? ?ASSESSMENT: ?1)  Induction of labor, progressing well ?    Coping: well. Feeling nauseated. ?    Membranes: AROM, meconium moderate ?    Reassuring maternal/fetal status ?    ? ?Principal Problem: ?  Labor and delivery, indication for care ? ? ?PLAN: ?Continue present management ?Zofran for nausea ?Anticipate NSVD ? ?Lloyd Huger, CNM ?06/30/2021 ?1:31 AM ? ?  ?

## 2021-07-01 ENCOUNTER — Encounter: Payer: Self-pay | Admitting: Obstetrics

## 2021-07-01 DIAGNOSIS — F411 Generalized anxiety disorder: Secondary | ICD-10-CM | POA: Diagnosis present

## 2021-07-01 DIAGNOSIS — O99345 Other mental disorders complicating the puerperium: Secondary | ICD-10-CM | POA: Diagnosis not present

## 2021-07-01 LAB — CBC WITH DIFFERENTIAL/PLATELET
Abs Immature Granulocytes: 0.1 10*3/uL — ABNORMAL HIGH (ref 0.00–0.07)
Basophils Absolute: 0.1 10*3/uL (ref 0.0–0.1)
Basophils Relative: 0 %
Eosinophils Absolute: 0.1 10*3/uL (ref 0.0–0.5)
Eosinophils Relative: 0 %
HCT: 26.9 % — ABNORMAL LOW (ref 36.0–46.0)
Hemoglobin: 8.3 g/dL — ABNORMAL LOW (ref 12.0–15.0)
Immature Granulocytes: 1 %
Lymphocytes Relative: 9 %
Lymphs Abs: 1.5 10*3/uL (ref 0.7–4.0)
MCH: 25.8 pg — ABNORMAL LOW (ref 26.0–34.0)
MCHC: 30.9 g/dL (ref 30.0–36.0)
MCV: 83.5 fL (ref 80.0–100.0)
Monocytes Absolute: 1.1 10*3/uL — ABNORMAL HIGH (ref 0.1–1.0)
Monocytes Relative: 7 %
Neutro Abs: 13.8 10*3/uL — ABNORMAL HIGH (ref 1.7–7.7)
Neutrophils Relative %: 83 %
Platelets: 224 10*3/uL (ref 150–400)
RBC: 3.22 MIL/uL — ABNORMAL LOW (ref 3.87–5.11)
RDW: 14.9 % (ref 11.5–15.5)
WBC: 16.7 10*3/uL — ABNORMAL HIGH (ref 4.0–10.5)
nRBC: 0 % (ref 0.0–0.2)

## 2021-07-01 MED ORDER — HYDROXYZINE HCL 10 MG PO TABS
10.0000 mg | ORAL_TABLET | Freq: Three times a day (TID) | ORAL | 0 refills | Status: DC | PRN
  Filled 2021-07-01: qty 30, 10d supply, fill #0

## 2021-07-01 MED ORDER — HYDROXYZINE HCL 10 MG PO TABS
10.0000 mg | ORAL_TABLET | Freq: Three times a day (TID) | ORAL | Status: DC | PRN
  Administered 2021-07-01: 10 mg via ORAL
  Filled 2021-07-01 (×4): qty 1

## 2021-07-01 MED ORDER — PANTOPRAZOLE SODIUM 40 MG PO TBEC
40.0000 mg | DELAYED_RELEASE_TABLET | Freq: Every day | ORAL | Status: DC
  Administered 2021-07-01 – 2021-07-02 (×2): 40 mg via ORAL
  Filled 2021-07-01 (×2): qty 1

## 2021-07-01 NOTE — Progress Notes (Signed)
Post Partum Day 0 ?Subjective: ?Elizabeth Berger is very exhausted. She has only slept about 2 hours since the birth. She has been ambulating without difficulty. She is still having large residuals after voiding. Will insert Foley for 12-24  hours if residual > 150 after next void. Having some heaviness/discomfort in upper abdomen and back with certain movements. Appears to be musculoskeletal from strenuous pushing and vomiting yesterday. She reports that she has an appetite now and will eat breakfast. Concerns about anxiety and bonding with baby from RN. ? ?Objective: ?Blood pressure 100/70, pulse 99, temperature 98.4 ?F (36.9 ?C), temperature source Oral, resp. rate 20, height 5\' 2"  (1.575 m), weight 72.6 kg, last menstrual period 09/03/2020, SpO2 99 %, unknown if currently breastfeeding. ? ?Physical Exam:  ?General: alert, cooperative, and fatigued ?Lochia: appropriate ?Uterine Fundus: firm ?Laceration: healing well ?DVT Evaluation: No evidence of DVT seen on physical exam. ? ?Recent Labs  ?  06/29/21 ?0630 07/01/21 ?0628  ?HGB 10.4* 8.3*  ?HCT 34.1* 26.9*  ? ? ?Assessment/Plan: ?Plan for discharge tomorrow ?Continue to monitor urine output\ ?Consult in for behavioral health - spoke with Gust Rung this morning who will be by to evaluate and discuss outpatient coordination ? ? LOS: 2 days  ? ?Lurlean Horns ?07/01/2021, 8:04 AM  ? ? ?

## 2021-07-01 NOTE — Consult Note (Cosign Needed)
[facility] Face-to-Face Psychiatry Consult   Reason for Consult:  anxiety, questionable postpartum depression Referring Physician:  Ob/Gyn Patient Identification: Elizabeth Berger MRN:  [id number] Principal Diagnosis: Labor and delivery, indication for care Diagnosis:  Principal Problem:   Labor and delivery, indication for care Active Problems:   Generalized anxiety disorder   Total Time spent with patient: 45 minutes  Subjective:   Elizabeth Berger is a 31 y.o. female patient admitted for labor, history of anxiety.  HPI:  31 yo female who presented for labor.  She had a 30 hour labor with 5 hours of pushing.  "I'm good.  I jut can't wait to get home, I exhausted."  She was alone in the room during the assessment as her mother went to get food.  Ledell Noss was holding her baby and very attentive to her, bottle feeding.  She talked about her name and calling her Nellie and how she does not cry, alert and looking around.  She has her significant other and his mother for assistance and they live with his mother.  Her parents are also involved and her significant other's aunt is coming this weekend to assist with the baby so they can get some rest.  Denies depression and anxiety and "I hope it does not come back (anxiety)".  She was too sick yesterday to eat but today that has resolved and she ate yogurt and a banana earlier, just ordered lunch and is hungry.  She denies issues with sleeping and encouraged to nap when the baby does with baby in the basinet.  She has no concerns and is agreeable to try hydroxyzine 10 mg TID PRN if her anxiety returns.  No attachment concerns.    Past Psychiatric History: anxiety  Risk to Self:  none Risk to Others:  none Prior Inpatient Therapy: none  Prior Outpatient Therapy:  OB/gyn  Past Medical History:  Past Medical History:  Diagnosis Date   FH: migraines    mom   Herpes    1&2 IgG+   Migraines     Past Surgical History:  Procedure Laterality Date    TONSILLECTOMY     Family History:  Family History  Problem Relation Age of Onset   Arthritis Mother    Depression Mother    Depression Maternal Grandmother    Cancer Maternal Grandmother 21       Breast   Cancer Maternal Grandfather        ? type ?bone? liver vs kidney   Hearing loss Maternal Grandfather    Family Psychiatric  History: see above Social History:  Social History   Substance and Sexual Activity  Alcohol Use Not Currently     Social History   Substance and Sexual Activity  Drug Use Not Currently    Social History   Socioeconomic History   Marital status: Significant Other    Spouse name: Devon   Number of children: Not on file   Years of education: Not on file   Highest education level: Not on file  Occupational History   Not on file  Tobacco Use   Smoking status: Former   Smokeless tobacco: Never   Tobacco comments:    social smoker   Media planner   Vaping Use: Never used  Substance and Sexual Activity   Alcohol use: Not Currently   Drug use: Not Currently   Sexual activity: Not Currently    Partners: Male    Comment: undecided  Other Topics Concern   Not  on file  Social History Narrative       [employer] [place]    HS ed    No kids    Wears seat belt, no guns, safe in relationship    Social Determinants of Health   Financial Resource Strain: Not on file  Food Insecurity: Not on file  Transportation Needs: Not on file  Physical Activity: Not on file  Stress: Not on file  Social Connections: Not on file   Additional Social History:    Allergies:  No Known Allergies  Labs:  Results for orders placed or performed during the hospital encounter of 06/29/21 (from the past 48 hour(s))  CBC with Differential/Platelet     Status: Abnormal   Collection Time: 07/01/21  6:28 AM  Result Value Ref Range   WBC 16.7 (H) 4.0 - 10.5 K/uL   RBC 3.22 (L) 3.87 - 5.11 MIL/uL   Hemoglobin 8.3 (L) 12.0 - 15.0 g/dL    HCT 26.9 (L) 36.0 - 46.0 %   MCV 83.5 80.0 - 100.0 fL   MCH 25.8 (L) 26.0 - 34.0 pg   MCHC 30.9 30.0 - 36.0 g/dL   RDW 14.9 11.5 - 15.5 %   Platelets 224 150 - 400 K/uL   nRBC 0.0 0.0 - 0.2 %   Neutrophils Relative % 83 %   Neutro Abs 13.8 (H) 1.7 - 7.7 K/uL   Lymphocytes Relative 9 %   Lymphs Abs 1.5 0.7 - 4.0 K/uL   Monocytes Relative 7 %   Monocytes Absolute 1.1 (H) 0.1 - 1.0 K/uL   Eosinophils Relative 0 %   Eosinophils Absolute 0.1 0.0 - 0.5 K/uL   Basophils Relative 0 %   Basophils Absolute 0.1 0.0 - 0.1 K/uL   Immature Granulocytes 1 %   Abs Immature Granulocytes 0.10 (H) 0.00 - 0.07 K/uL    Comment: Performed at [facility], [address], [place], [place] [address]    Current Facility-Administered Medications  Medication Dose Route Frequency Provider Last Rate Last Admin   acetaminophen (TYLENOL) tablet 650 mg  650 mg Oral Q4H PRN Lurlean Horns, CNM   650 mg at 06/30/21 1706   benzocaine-Menthol (DERMOPLAST) 20-0.5 % topical spray 1 application  1 application Topical PRN Lloyd Huger M, CNM       coconut oil  1 application Topical PRN Lloyd Huger M, CNM       diphenhydrAMINE (BENADRYL) capsule 25 mg  25 mg Oral Q6H PRN Lloyd Huger M, CNM       docusate sodium (COLACE) capsule 100 mg  100 mg Oral BID Lloyd Huger M, CNM   100 mg at 07/01/21 9371   ibuprofen (ADVIL) tablet 600 mg  600 mg Oral Q6H Lloyd Huger M, CNM   600 mg at 07/01/21 1321   LORazepam (ATIVAN) tablet 1 mg  1 mg Oral Q4H PRN Lurlean Horns, CNM       methylergonovine (METHERGINE) tablet 0.2 mg  0.2 mg Oral Q4H PRN Lloyd Huger M, CNM       Or   methylergonovine (METHERGINE) injection 0.2 mg  0.2 mg Intramuscular Q4H PRN Lloyd Huger M, CNM       ondansetron [facility]) injection 4 mg  4 mg Intravenous Q4H PRN Lloyd Huger M, CNM   4 mg at 06/30/21 1707   oxyCODONE-acetaminophen (PERCOCET/ROXICET) 5-325 MG per tablet 1 tablet  1 tablet Oral Q4H PRN Lurlean Horns, CNM  oxytocin (PITOCIN) IV infusion 30 units in NS 500 mL - Premix  2.5 Units/hr Intravenous Continuous PRN Swanson, Melissa M, CNM       pantoprazole (PROTONIX) EC tablet 40 mg  40 mg Oral Daily Lloyd Huger M, CNM   40 mg at 07/01/21 1950   prenatal multivitamin tablet 1 tablet  1 tablet Oral Q1200 Lurlean Horns, CNM   1 tablet at 07/01/21 1321   propranolol (INDERAL) tablet 10-20 mg  10-20 mg Oral TID PRN Lurlean Horns, CNM   10 mg at 06/30/21 9326   simethicone (MYLICON) chewable tablet 80 mg  80 mg Oral PRN Lurlean Horns, CNM       Tdap (BOOSTRIX) injection 0.5 mL  0.5 mL Intramuscular Once Norberto Sorenson, Melissa M, CNM       varicella virus vaccine live (VARIVAX) injection 0.5 mL  0.5 mL Subcutaneous Prior to discharge Norberto Sorenson, Melissa M, CNM       zolpidem (AMBIEN) tablet 5 mg  5 mg Oral QHS PRN Lurlean Horns, CNM        Musculoskeletal: Strength & Muscle Tone: within normal limits Gait & Station: normal Patient leans: N/A   Psychiatric Specialty Exam: Physical Exam Vitals and nursing note reviewed.  Constitutional:      Appearance: Normal appearance.  HENT:     Head: Normocephalic.     Nose: Nose normal.  Pulmonary:     Effort: Pulmonary effort is normal.  Musculoskeletal:        General: Normal range of motion.     Cervical back: Normal range of motion.  Neurological:     General: No focal deficit present.     Mental Status: She is alert and oriented to person, place, and time.  Psychiatric:        Attention and Perception: Attention and perception normal.        Mood and Affect: Mood and affect normal.        Speech: Speech normal.        Behavior: Behavior normal. Behavior is cooperative.        Thought Content: Thought content normal.        Cognition and Memory: Cognition and memory normal.        Judgment: Judgment normal.    Review of Systems  Constitutional:  Positive for malaise/fatigue.  All other systems reviewed and are  negative.  Blood pressure 100/70, pulse 99, temperature 98.4 F (36.9 C), temperature source Oral, resp. rate 20, height 5\' 2"  (1.575 m), weight 72.6 kg, last menstrual period 09/03/2020, SpO2 99 %, unknown if currently breastfeeding.Body mass index is 29.26 kg/m.  General Appearance: Casual  Eye Contact:  Good  Speech:  Normal Rate  Volume:  Normal  Mood:  Euthymic  Affect:  Congruent  Thought Process:  Coherent and Descriptions of Associations: Intact  Orientation:  Full (Time, Place, and Person)  Thought Content:  WDL and Logical  Suicidal Thoughts:  No  Homicidal Thoughts:  No  Memory:  Immediate;   Good Recent;   Good Remote;   Good  Judgement:  Good  Insight:  Good  Psychomotor Activity:  Decreased  Concentration:  Concentration: Good and Attention Span: Good  Recall:  Good  Fund of Knowledge:  Good  Language:  Good  Akathisia:  No  Handed:  Right  AIMS (if indicated):     Assets:  Housing Intimacy Leisure Time Physical Health Resilience Social Support  ADL's:  Intact  Cognition:  WNL  Sleep:        Physical Exam: Physical Exam Vitals and nursing note reviewed.  Constitutional:      Appearance: Normal appearance.  HENT:     Head: Normocephalic.     Nose: Nose normal.  Pulmonary:     Effort: Pulmonary effort is normal.  Musculoskeletal:        General: Normal range of motion.     Cervical back: Normal range of motion.  Neurological:     General: No focal deficit present.     Mental Status: She is alert and oriented to person, place, and time.  Psychiatric:        Attention and Perception: Attention and perception normal.        Mood and Affect: Mood and affect normal.        Speech: Speech normal.        Behavior: Behavior normal. Behavior is cooperative.        Thought Content: Thought content normal.        Cognition and Memory: Cognition and memory normal.        Judgment: Judgment normal.   Review of Systems  Constitutional:  Positive for  malaise/fatigue.  All other systems reviewed and are negative. Blood pressure 100/70, pulse 99, temperature 98.4 F (36.9 C), temperature source Oral, resp. rate 20, height 5\' 2"  (1.575 m), weight 72.6 kg, last menstrual period 09/03/2020, SpO2 99 %, unknown if currently breastfeeding. Body mass index is 29.26 kg/m.  Treatment Plan Summary: Anxiety disorder during the pregnancy, concerned about labor identified as the trigger.  Now, she is calm and cooperative, fatigued from labor.  Recommend hydroxyzine 10 mg TID PRN for anxiety  Disposition: No evidence of imminent risk to self or others at present.   Patient does not meet criteria for psychiatric inpatient admission. Supportive therapy provided about ongoing stressors.  Waylan Boga, NP 07/01/2021 2:14 PM

## 2021-07-01 NOTE — Progress Notes (Addendum)
Mother complains/displays signs of exhaustion, reports lack of sleep and inadequate rest. Father providing majority of care for infant. Will encourage bonding.  ?

## 2021-07-01 NOTE — Anesthesia Postprocedure Evaluation (Signed)
Anesthesia Post Note ? ?Patient: Elizabeth Berger ? ?Procedure(s) Performed: AN AD HOC LABOR EPIDURAL ? ?Patient location during evaluation: Mother Baby ?Anesthesia Type: Epidural ?Level of consciousness: awake and alert ?Pain management: satisfactory to patient ?Vital Signs Assessment: post-procedure vital signs reviewed and stable ?Respiratory status: spontaneous breathing, nonlabored ventilation and respiratory function stable ?Cardiovascular status: stable ?Postop Assessment: no headache, no backache, epidural receding, able to ambulate and adequate PO intake ?Anesthetic complications: yes ?Comments: Pt reports areas of intermittent numbness and tingling to R knee, R hip.  Pt reports that  also included R ankle, but this is now improving. Pt also reports incomplete voiding sensation. Instructed to notify staff and anesthesia dept if symptoms persist or worsen. Plan for f/u evaluation ? ? ?No notable events documented. ? ? ?Last Vitals:  ?Vitals:  ? 07/01/21 0414 07/01/21 0743  ?BP: 101/68 100/70  ?Pulse: 95 99  ?Resp: 18 20  ?Temp:  36.9 ?C  ?SpO2: 98% 99%  ?  ?Last Pain:  ?Vitals:  ? 07/01/21 0743  ?TempSrc: Oral  ?PainSc:   ? ? ?  ?  ?  ?  ?  ?  ? ?Rolla Plate P ? ? ? ? ?

## 2021-07-01 NOTE — Consult Note (Signed)
Patient seen for report of numbness on right medial thigh.  Patient had uneventful labor epidural placement on 3/1 with prolonged pushing phase of labor (5+ hours per patient).  Patient reports that she is able to ambulate normally.  She reports no motor weakness at this time.  She feels that the numbness is much improved from yesterday. ?This numbness is most likely from nerve compression from patient positioning during her prolonged labor and often self resolves.   ?Patient and nursing staff instructed to let us know if patient has new symptoms, her symptoms get worse or that they feel that the symptoms are not resolving. ?We plan to follow up with the patient tomorrow. ?

## 2021-07-02 ENCOUNTER — Other Ambulatory Visit: Payer: Self-pay | Admitting: Obstetrics and Gynecology

## 2021-07-02 DIAGNOSIS — O480 Post-term pregnancy: Secondary | ICD-10-CM

## 2021-07-02 MED ORDER — DOCUSATE SODIUM 100 MG PO CAPS
100.0000 mg | ORAL_CAPSULE | Freq: Two times a day (BID) | ORAL | 2 refills | Status: DC | PRN
  Filled 2021-07-02: qty 30, 15d supply, fill #0

## 2021-07-02 MED ORDER — HYDROXYZINE HCL 25 MG PO TABS
25.0000 mg | ORAL_TABLET | Freq: Four times a day (QID) | ORAL | 2 refills | Status: DC | PRN
  Filled 2021-07-02: qty 30, 8d supply, fill #0

## 2021-07-02 MED ORDER — IBUPROFEN 600 MG PO TABS
600.0000 mg | ORAL_TABLET | Freq: Four times a day (QID) | ORAL | 1 refills | Status: DC
  Filled 2021-07-02: qty 60, 15d supply, fill #0

## 2021-07-02 MED ORDER — FERROUS SULFATE 325 (65 FE) MG PO TABS: 325.0000 mg | ORAL_TABLET | Freq: Two times a day (BID) | ORAL | 0 refills | Status: DC

## 2021-07-02 MED ORDER — FERROUS SULFATE 325 (65 FE) MG PO TABS
325.0000 mg | ORAL_TABLET | Freq: Two times a day (BID) | ORAL | 0 refills | Status: DC
  Filled 2021-07-02: qty 60, 30d supply, fill #0

## 2021-07-02 MED ORDER — IBUPROFEN 600 MG PO TABS: 600.0000 mg | ORAL_TABLET | Freq: Four times a day (QID) | ORAL | 1 refills | Status: DC

## 2021-07-02 MED ORDER — DOCUSATE SODIUM 100 MG PO CAPS: 100.0000 mg | ORAL_CAPSULE | Freq: Two times a day (BID) | ORAL | 2 refills | Status: DC | PRN

## 2021-07-02 MED ORDER — HYDROXYZINE HCL 25 MG PO TABS: 25.0000 mg | ORAL_TABLET | Freq: Four times a day (QID) | ORAL | 2 refills | Status: DC | PRN

## 2021-07-02 NOTE — Progress Notes (Signed)
Patient discharged home with family.  Discharge instructions, when to follow up, and prescriptions reviewed with patient.  Patient verbalized understanding. Patient will be escorted out by auxiliary.   

## 2021-07-02 NOTE — Progress Notes (Signed)
GAD-7 SCORE: 6 ?

## 2021-07-02 NOTE — Discharge Summary (Addendum)
? ?  Postpartum Discharge Summary ? ? ?   ?Patient Name: Elizabeth Berger ?DOB: Jan 22, 1991 ?MRN: [id number] ? ?Date of admission: 06/29/2021 ?Delivery date:06/30/2021  ?Delivering provider: Harlin Heys  ?Date of discharge: 07/02/2021 ? ?Admitting diagnosis: Labor and delivery, indication for care [O75.9] ?Intrauterine pregnancy: [redacted]w[redacted]d    ?Secondary diagnosis:  Principal Problem: ?  Labor and delivery, indication for care ?Active Problems: ?  Generalized anxiety disorder ?  Post-dates pregnancy ? ?Additional problems: Post-operative urinary retention    ?Discharge diagnosis: Term Pregnancy Delivered and Anemia (iron deficiency)                                              ?Post partum procedures: Psychiatry consult ?Augmentation: AROM, Pitocin, and Cytotec ?Complications: None ? ?Hospital course: Induction of Labor With Vaginal Delivery   ?31y.o. yo G1P1001 at 445w0das admitted to the hospital 06/29/2021 for induction of labor.  Indication for induction: Postdates.  Patient had an uncomplicated labor course as follows: ?Membrane Rupture Time/Date: 1:25 AM ,06/30/2021   ?Delivery Method:Vaginal, Vacuum (Extractor)  ?Episiotomy: None  ?Lacerations:  2nd degree  ?Details of delivery can be found in separate delivery note.  Patient had a postpartum course complicated by post-operative urinary retention, which resolved by PPD #2. She was also noted to have significant anxiety postpartum, Psych consult performed, initiated on Atarax 10 mg TID prn. Patient is discharged home 07/02/21. ? ?Newborn Data: ?Birth date:06/30/2021  ?Birth time:11:01 AM  ?Gender:Female  ?Living status:Living  ?Apgars:6 ,9  ?Weight:3570 g  ? ?Magnesium Sulfate received: No ?BMZ received: No ?Rhophylac:No ?MMR:No ?T-DaP:Given prenatally ?Flu: No ?Transfusion:No ? ?Physical exam  ?Vitals:  ? 07/01/21 1736 07/01/21 1942 07/01/21 2332 07/02/21 0851  ?BP: 116/81 113/81 111/80 104/74  ?Pulse: (!) 104 (!) 102 (!) 102 100  ?Resp:  _0 ?Temp:   98 ?F  (36.7 ?C) 98 ?F (36.7 ?C)  ?TempSrc:   Oral Oral  ?SpO2:  98% 100% 99%  ?Weight:      ?Height:      ? ?General: alert, cooperative, and no distress ?Lochia: appropriate ?Uterine Fundus: firm ?Incision: N/A ?DVT Evaluation: No evidence of DVT seen on physical exam. ?Negative Homan's sign. ?No cords or calf tenderness. ?No significant calf/ankle edema. ?Labs: ?Lab Results  ?Component Value Date  ? WBC 16.7 (H) 07/01/2021  ? HGB 8.3 (L) 07/01/2021  ? HCT 26.9 (L) 07/01/2021  ? MCV 83.5 07/01/2021  ? PLT 224 07/01/2021  ? ? ?CMP Latest Ref Rng & Units 07/03/2019  ?Glucose 70 - 99 mg/dL 87  ?BUN 6 - 23 mg/dL 18  ?Creatinine 0.40 - 1.20 mg/dL 0.71  ?Sodium 135 - 145 mEq/L 135  ?Potassium 3.5 - 5.1 mEq/L 5.3 No hemolysis seen(H)  ?Chloride 96 - 112 mEq/L 103  ?CO2 19 - 32 mEq/L 21  ?Calcium 8.4 - 10.5 mg/dL 9.9  ?Total Protein 6.0 - 8.3 g/dL 7.9  ?Total Bilirubin 0.2 - 1.2 mg/dL 0.3  ?Alkaline Phos 39 - 117 U/L 70  ?AST 0 - 37 U/L 16  ?ALT 0 - 35 U/L 16  ? ? ?Edinburgh Score: ?Edinburgh Postnatal Depression Scale Screening Tool 07/01/2021  ?I have been able to laugh and see the funny side of things. 0  ?I have looked forward with enjoyment to things. 2  ?I have blamed myself unnecessarily when things went  wrong. 0  ?I have been anxious or worried for no good reason. 2  ?I have felt scared or panicky for no good reason. 2  ?Things have been getting on top of me. 0  ?I have been so unhappy that I have had difficulty sleeping. 0  ?I have felt sad or miserable. 0  ?I have been so unhappy that I have been crying. 0  ?The thought of harming myself has occurred to me. 0  ?Edinburgh Postnatal Depression Scale Total 6  ? ? ? ? ?After visit meds:  ?Allergies as of 07/02/2021   ?No Known Allergies ?  ? ?  ?Medication List  ?  ? ?STOP taking these medications   ? ?ondansetron 4 MG disintegrating tablet ?Commonly known as: Zofran ODT ?  ?PRENATAL PO ?  ?propranolol 10 MG tablet ?Commonly known as: INDERAL ?  ? ?  ? ?TAKE these medications    ? ?docusate sodium 100 MG capsule ?Commonly known as: COLACE ?Take 1 capsule (100 mg total) by mouth 2 (two) times daily as needed. ?  ?ferrous sulfate 325 (65 FE) MG tablet ?Commonly known as: FerrouSul ?Take 1 tablet (325 mg total) by mouth 2 (two) times daily. ?  ?hydrOXYzine 25 MG tablet ?Commonly known as: ATARAX ?Take 1 tablet (25 mg total) by mouth every 6 (six) hours as needed for anxiety (sleep). ?  ?ibuprofen 600 MG tablet ?Commonly known as: ADVIL ?Take 1 tablet (600 mg total) by mouth every 6 (six) hours. ?  ?omeprazole 20 MG capsule ?Commonly known as: PRILOSEC ?Take 1 capsule (20 mg total) by mouth daily. ?  ?valACYclovir 1000 MG tablet ?Commonly known as: Valtrex ?Take 1 tablet (1,000 mg total) by mouth daily. ?  ? ?  ? ? ? ?Discharge home in stable condition ?Infant Feeding: Bottle ?Infant Disposition:home with mother ?Discharge instruction: per After Visit Summary and Postpartum booklet. ?Activity: Advance as tolerated. Pelvic rest for 6 weeks.  ?Diet: routine diet ?Anticipated Birth Control:  to be discussed postpartum ?Postpartum Appointment:6 weeks ?Additional Postpartum F/U: Postpartum Depression checkup ?Future Appointments:No future appointments. ?Follow up Visit: ? Follow-up Information   ? ? Lurlean Horns, CNM Follow up.   ?Specialty: Obstetrics ?Why: 2 weeks mood check televisit ?6 weeks postpartum visit ?Contact information: ?[address] ?[place] [address] ?[phone] ? ? ?  ?  ? ?  ?  ? ?  ? ? ? ?  ? ?07/02/2021 ?Rubie Maid, MD ?[facility] ? ? ?

## 2021-07-02 NOTE — Anesthesia Post-op Follow-up Note (Signed)
?  Anesthesia Pain Follow-up Note ? ?Patient: Elizabeth Berger ? ?Day #: 2 ? ?Date of Follow-up: 07/02/2021 Time: 10:18 PM ? ?Last Vitals: There were no vitals filed for this visit. ? ?Level of Consciousness: alert ? ?Pain: mild  ? ?Side Effects:None today.  Patient reports that symptoms of numbness from post-delivery day 1 have completely resolved.  She does still report not being able to feel when she voids, but she feels like she is able to void completely.  We reassured the patient that this will likely continue to improve, but if any symptoms worsen or she has any return of numbness then she should go to the ED immediately.  She voiced understanding. ? ?Catheter Site Exam:clean ? ? ? ? ?Plan: D/C from anesthesia care at surgeon's request ? ?Martha Clan ? ? ? ?

## 2021-07-04 ENCOUNTER — Other Ambulatory Visit: Payer: Self-pay

## 2021-07-14 ENCOUNTER — Telehealth (INDEPENDENT_AMBULATORY_CARE_PROVIDER_SITE_OTHER): Payer: No Typology Code available for payment source | Admitting: Obstetrics

## 2021-07-14 ENCOUNTER — Encounter: Payer: Self-pay | Admitting: Obstetrics

## 2021-07-14 NOTE — Progress Notes (Signed)
EPDS - 8 ?

## 2021-07-14 NOTE — Progress Notes (Signed)
Virtual Visit via Video Note ? ?I connected with Elizabeth Berger on 07/14/21 at  3:30 PM EDT by a video enabled telemedicine application and verified that I am speaking with the correct person using two identifiers. ? ?Location: ?Patient: Home ?Provider: Office ?  ?I discussed the limitations of evaluation and management by telemedicine and the availability of in person appointments. The patient expressed understanding and agreed to proceed. ? ?History of Present Illness: ?Elizabeth Berger is a 31 y.o. G1P1001 who is s/p VAVD with epidural anesthesia on 07/21/21. She had a second degree laceration. Baby girl Elizabeth Berger weighed 7 lbs., 14 oz. Elizabeth Berger had urinary retention for approximately 24 hours postpartum, requiring intermittent catheterization.  ?  ?Observations/Objective: ?1) Laceration: healing. Occasional pain. Denies redness or tenderness. ?2) Voiding: still unable to feel the sensation to void. She is doing scheduled voids and voiding normal amounts at regular intervals, but she does not feel the urge to void and has no sensation while she is voiding. ?3) Bowel: BMs are normal ?4) Bleeding: scant ?5) Mood: Much improved. EPDS 8. Anxiety the first week was "terrible - unable to eat or sleep." She reports she took Atarax on a few occasions and is feeling much better now.  ?6) Support: family is helping with the baby nightly to allow Elizabeth Berger to shower, eat, and unwind for a little bit. ?7) Feeding: Elizabeth Berger is bottle feeding. Elizabeth Berger reports a few days of breast engorgement which has resolved without complication. ? ?Assessment and Plan: ?1) Continue scheduled voiding. Will refer to urology if still not feeling urge to void at 6-week visit.  ?2) Continue to monitor mood.  ?3) Otherwise normal PP course. ?4) 6-week PP visit scheduled for 08/10/21. ? ?Follow Up Instructions: ?  ?I discussed the assessment and treatment plan with the patient. The patient was provided an opportunity to ask questions and all were  answered. The patient agreed with the plan and demonstrated an understanding of the instructions. ?  ?The patient was advised to call back or seek an in-person evaluation if the symptoms worsen or if the condition fails to improve as anticipated. ? ?I provided 5 minutes of non-face-to-face time during this encounter. ? ? ?Lurlean Horns, CNM ? ? ?

## 2021-07-30 ENCOUNTER — Other Ambulatory Visit: Payer: Self-pay | Admitting: Obstetrics and Gynecology

## 2021-08-08 ENCOUNTER — Encounter: Payer: No Typology Code available for payment source | Admitting: Obstetrics

## 2021-08-10 ENCOUNTER — Other Ambulatory Visit: Payer: Self-pay | Admitting: Certified Nurse Midwife

## 2021-08-10 DIAGNOSIS — R39198 Other difficulties with micturition: Secondary | ICD-10-CM

## 2021-08-11 ENCOUNTER — Encounter: Payer: No Typology Code available for payment source | Admitting: Obstetrics

## 2021-08-12 ENCOUNTER — Ambulatory Visit (INDEPENDENT_AMBULATORY_CARE_PROVIDER_SITE_OTHER): Payer: Medicaid Other | Admitting: Certified Nurse Midwife

## 2021-08-12 ENCOUNTER — Other Ambulatory Visit: Payer: Self-pay

## 2021-08-12 VITALS — Temp 98.1°F

## 2021-08-12 DIAGNOSIS — R39198 Other difficulties with micturition: Secondary | ICD-10-CM

## 2021-08-12 LAB — POCT URINALYSIS DIPSTICK
Bilirubin, UA: NEGATIVE
Blood, UA: NEGATIVE
Glucose, UA: NEGATIVE
Ketones, UA: NEGATIVE
Nitrite, UA: POSITIVE
Protein, UA: NEGATIVE
Spec Grav, UA: 1.025 (ref 1.010–1.025)
Urobilinogen, UA: 0.2 E.U./dL
pH, UA: 6 (ref 5.0–8.0)

## 2021-08-12 MED ORDER — NITROFURANTOIN MONOHYD MACRO 100 MG PO CAPS
100.0000 mg | ORAL_CAPSULE | Freq: Two times a day (BID) | ORAL | 0 refills | Status: DC
  Filled 2021-08-12: qty 14, 7d supply, fill #0

## 2021-08-12 NOTE — Progress Notes (Signed)
Patient came in to drop off urine sample. She has been having difficulty voiding since her delivery on 06/30/2021. Urinalysis was performed and it was positive for Nitrates and Leukocytes. She reports having no feeling when she has to urinate or when she is urinating. She has to strain or push to get urine out. She has an appointment with Urology on August 25, 2021. ?

## 2021-08-15 ENCOUNTER — Encounter: Payer: Self-pay | Admitting: Certified Nurse Midwife

## 2021-08-15 LAB — URINE CULTURE

## 2021-08-17 ENCOUNTER — Other Ambulatory Visit: Payer: Self-pay

## 2021-08-17 ENCOUNTER — Ambulatory Visit (INDEPENDENT_AMBULATORY_CARE_PROVIDER_SITE_OTHER): Payer: No Typology Code available for payment source | Admitting: Obstetrics

## 2021-08-17 VITALS — BP 115/80 | HR 102 | Wt 144.0 lb

## 2021-08-17 DIAGNOSIS — O99345 Other mental disorders complicating the puerperium: Secondary | ICD-10-CM

## 2021-08-17 DIAGNOSIS — F418 Other specified anxiety disorders: Secondary | ICD-10-CM

## 2021-08-17 MED ORDER — NORETHINDRONE 0.35 MG PO TABS
1.0000 | ORAL_TABLET | Freq: Every day | ORAL | 3 refills | Status: DC
  Filled 2021-08-17: qty 84, 84d supply, fill #0
  Filled 2021-10-18 – 2021-11-04 (×2): qty 84, 84d supply, fill #1
  Filled 2021-12-06 – 2022-01-25 (×2): qty 84, 84d supply, fill #2
  Filled 2022-04-26: qty 84, 84d supply, fill #3

## 2021-08-17 MED ORDER — SERTRALINE HCL 50 MG PO TABS
50.0000 mg | ORAL_TABLET | Freq: Every day | ORAL | 1 refills | Status: DC
  Filled 2021-08-17: qty 30, 30d supply, fill #0
  Filled 2021-11-04: qty 30, 30d supply, fill #1

## 2021-08-17 NOTE — Progress Notes (Signed)
SUBJECTIVE ? ?Elizabeth Berger is a 31 y.o. G1P1001 here today for a PP follow-up visit. She gave birth to a viable female infant "[name]" at 51 weeks via VAVD on 07/21/21 with epidural anesthesia. She had a second degree laceration. She is bottle feeding. She is still having issues with urinary retention and no sensation to void. She also currently has a UTI for which she is being treated. She has urology follow-up scheduled for next week. She is struggling with anxiety and would like to discuss her options. She also has a wart in her gluteal fold that she would like removed today. She has not yet resumed intercourse ? ?ROS ? ?Constitutional: negative for anorexia, chills, and sweats ?Respiratory: negative for cough, dyspnea on exertion, and wheezing ?Cardiovascular: negative for chest pain, chest pressure/discomfort, irregular heart beat, and palpitations ?Gastrointestinal: negative for abdominal pain, change in bowel habits, constipation, and diarrhea ?Genitourinary:positive for urinary retention, UTI ?Integument/breast: negative for breast lump and breast tenderness ?Musculoskeletal:negative ?Behavioral/Psych: positive for anxiety ? ?Mood: Anxious, overwhelmed. EPDS 14. ?  ? ?Bowel function: Normal ?Bladder function: Difficulty urinating, lack of sensation ?Vaginal bleeding: None ?Pain: Denies ? ?Denies difficulty breathing, chest pain, lower extremity pain or swelling, excessive vaginal bleeding, vaginal pain.  ? ?Infant: Doing well. No complications. ? ?OBJECTIVE ?Last Weight  Most recent update: 08/17/2021  2:13 PM  ? ? Weight  ?65.3 kg (144 lb)  ?      ? ?  ? ?Body mass index is 26.34 kg/m?. ? ?BP 115/80   Pulse (!) 102   Wt 144 lb (65.3 kg)   Breastfeeding No   BMI 26.34 kg/m?  ?Patient has no known allergies. ? ?Past Medical/Surgical History ?Past Medical History:  ?Diagnosis Date  ? FH: migraines   ? mom  ? 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 Migraines   ? ?Past Surgical History:  ?Procedure Laterality Date  ?  TONSILLECTOMY    ? ? ?Last Pap: approximate date 8/22 and was normal ? ?BP 115/80   Pulse (!) 102   Wt 144 lb (65.3 kg)   Breastfeeding No   BMI 26.34 kg/m?  ?General appearance: alert, cooperative, and appears stated age, teary ?Head: Normocephalic, without obvious abnormality, atraumatic ?Neck: no adenopathy, supple, symmetrical, trachea midline, and thyroid not enlarged, symmetric, no tenderness/mass/nodules ?Lungs: clear to auscultation bilaterally ?Breasts: normal appearance, no masses or tenderness, No nipple retraction or dimpling, No axillary or supraclavicular adenopathy, Normal to palpation without dominant masses ?Heart: regular rate and rhythm, S1, S2 normal, no murmur, click, rub or gallop ?Abdomen: soft, non-tender; bowel sounds normal; no masses,  no organomegaly ?Pelvic: external genitalia norextremities normal, atraumatic, no cyanosis or edemamal and fundus not palpable. ?Extremities: extremities normal, atraumatic, no cyanosis or edema ?Pulses: 2+ and symmetric ?Skin: Skin color, texture, turgor normal. No rashes or lesions TCA applied to wart. ?Lymph nodes: Cervical, supraclavicular, and axillary nodes normal. ? ?ASSESSMENT ?1) 6 weeks PP ?2) Postpartum anxiety ?3) Urinary retention ?4) Desires contraception ? ?PLAN ?1) Healthy overall. No activity limitations. ?2) Discussed PP mood changes, coping strategies, and sources of support. Rx for Zoloft sent and referral to behavioral health. Discussed how to take Zoloft, potential side effects, and danger signs. ?3) Urology visit scheduled ?4) Desires POPs. Rx sent to pharmacy. Reviewed importance of taking at the same time every day. ? ?Follow up: Mood check in 2 weeks. Annual visit in 6 months-1 year. ? ?Lloyd Huger, CNM  ?

## 2021-08-23 NOTE — Progress Notes (Incomplete)
? ?08/23/21 ?2:08 PM  ? ?Elizabeth Berger ?12-18-1990 ?[id number] ? ?Referring provider:  ?Philip Aspen, CNM ?[address] ?[place],  [place] [address] ?No chief complaint on file. ? ? ? ? ?HPI: ?Elizabeth Berger is a 31 y.o.female who presents today for further evaluation of difficulty voiding.  ? ?She has a personal history of kidney stones ? ?She was last seen in clinic by me on 06/21/2018. She has spontaneous passage of a stone. KUB during that time was without addition stone burden appreciated.  ? ? ? ? ? ? ?PMH: ?Past Medical History:  ?Diagnosis Date  ? FH: migraines   ? mom  ? 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 Migraines   ? ? ?Surgical History: ?Past Surgical History:  ?Procedure Laterality Date  ? TONSILLECTOMY    ? ? ?Home Medications:  ?Allergies as of 08/24/2021   ?No Known Allergies ?  ? ?  ?Medication List  ?  ? ?  ? Accurate as of August 23, 2021  2:08 PM. If you have any questions, ask your nurse or doctor.  ?  ?  ? ?  ? ?docusate sodium 100 MG capsule ?Commonly known as: COLACE ?Take 1 capsule (100 mg total) by mouth 2 (two) times daily as needed. ?  ?ferrous sulfate 325 (65 FE) MG tablet ?Commonly known as: FerrouSul ?Take 1 tablet (325 mg total) by mouth 2 (two) times daily. ?  ?hydrOXYzine 25 MG tablet ?Commonly known as: ATARAX ?Take 1 tablet (25 mg total) by mouth every 6 (six) hours as needed for anxiety (sleep). ?  ?ibuprofen 600 MG tablet ?Commonly known as: ADVIL ?Take 1 tablet (600 mg total) by mouth every 6 (six) hours. ?  ?nitrofurantoin (macrocrystal-monohydrate) 100 MG capsule ?Commonly known as: MACROBID ?Take 1 capsule (100 mg total) by mouth 2 (two) times daily. ?  ?norethindrone 0.35 MG tablet ?Commonly known as: MICRONOR ?Take 1 tablet (0.35 mg total) by mouth daily. ?  ?omeprazole 20 MG capsule ?Commonly known as: PRILOSEC ?Take 1 capsule (20 mg total) by mouth daily. ?  ?sertraline 50 MG tablet ?Commonly known as: Zoloft ?Take 1/2 tablet by mouth daily for the first week,  then take one tablet daily ?(Take 1 tablet (50 mg total) by mouth daily. Take half a tab daily for the first week, then one tab daily) ?  ?valACYclovir 1000 MG tablet ?Commonly known as: Valtrex ?Take 1 tablet (1,000 mg total) by mouth daily. ?  ? ?  ? ? ?Allergies:  ?No Known Allergies ? ?Family History: ?Family History  ?Problem Relation Age of Onset  ? Arthritis Mother   ? Depression Mother   ? Depression Maternal Grandmother   ? Cancer Maternal Grandmother 34  ?     Breast  ? Cancer Maternal Grandfather   ?     ? type ?bone? liver vs kidney  ? Hearing loss Maternal Grandfather   ? ? ?Social History:  reports that she has quit smoking. She has never used smokeless tobacco. She reports that she does not currently use alcohol. She reports that she does not currently use drugs. ? ? ?Physical Exam: ?There were no vitals taken for this visit.  ?Constitutional:  Alert and oriented, No acute distress. ?HEENT: [place] AT, moist mucus membranes.  Trachea midline, no masses. ?Cardiovascular: No clubbing, cyanosis, or edema. ?Respiratory: Normal respiratory effort, no increased work of breathing. ?Skin: No rashes, bruises or suspicious lesions. ?Neurologic: Grossly intact, no focal deficits, moving all  4 extremities. ?Psychiatric: Normal mood and affect. ? ?Laboratory Data: ? ?Lab Results  ?Component Value Date  ? CREATININE 0.71 07/03/2019  ? ?Lab Results  ?Component Value Date  ? HGBA1C 4.6 07/03/2019  ? ? ?Urinalysis ? ? ?Pertinent Imaging: ? ? ? ?Assessment & Plan:   ? ? ?No follow-ups on file. ? ?I,Kailey Littlejohn,acting as a scribe for Hollice Espy, MD.,have documented all relevant documentation on the behalf of Hollice Espy, MD,as directed by  Hollice Espy, MD while in the presence of Hollice Espy, MD. ? ? ?[facility] ?[address] ?[place], [place] [address] ?[phone] ?

## 2021-08-24 ENCOUNTER — Ambulatory Visit: Payer: Medicaid Other | Admitting: Urology

## 2021-09-01 ENCOUNTER — Encounter: Payer: No Typology Code available for payment source | Admitting: Obstetrics

## 2021-09-06 NOTE — Progress Notes (Signed)
? ?09/07/2021 ?12:26 PM  ? ?Elizabeth Berger ?Apr 10, 1991 ?[id number] ? ?Referring provider:  ?Philip Aspen, CNM ?[address] ?[place],  [place] [address] ?Chief Complaint  ?Patient presents with  ? difficulty voiding  ? ? ?HPI: ?Elizabeth Berger is a 31 y.o.female who presents today for further evaluation of difficulty urinating.  ? ?She is post partum and she had vacuum assisted delivery with second degree laceration on 06/30/21.  Post op, she developed 24 hours of urinary retention but ultimately was able to void prior to discharge.   ? ?She was last seen in clinic in 2020. She has a history of kidney stones.  ? ?She was seen by gynecology by, Philip Aspen, CNM, for difficulty urinating on 08/12/2021. UA was unremarkable besides moderate leukocytes. Urine culture grew staphylococcus epidermidis she was prescribed Macrobid.  ? ?She reports that she is unable to feel when she has to void. Every 2-3 hours she voids even when she does not get the urge. She does not feel like she has an infection. No dysuria.   ? ?She reports that she suffered from post partum shortly after giving birth she is doing better now. She is working at the cancer center and really enjoys it.  ? ? ?PMH: ?Past Medical History:  ?Diagnosis Date  ? FH: migraines   ? mom  ? 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 Migraines   ? ? ?Surgical History: ?Past Surgical History:  ?Procedure Laterality Date  ? TONSILLECTOMY    ? ? ?Home Medications:  ?Allergies as of 09/07/2021   ?No Known Allergies ?  ? ?  ?Medication List  ?  ? ?  ? Accurate as of Sep 07, 2021 12:26 PM. If you have any questions, ask your nurse or doctor.  ?  ?  ? ?  ? ?STOP taking these medications   ? ?nitrofurantoin (macrocrystal-monohydrate) 100 MG capsule ?Commonly known as: MACROBID ?Stopped by: Hollice Espy, MD ?  ? ?  ? ?TAKE these medications   ? ?docusate sodium 100 MG capsule ?Commonly known as: COLACE ?Take 1 capsule (100 mg total) by mouth 2 (two) times daily as  needed. ?  ?ferrous sulfate 325 (65 FE) MG tablet ?Commonly known as: FerrouSul ?Take 1 tablet (325 mg total) by mouth 2 (two) times daily. ?  ?hydrOXYzine 25 MG tablet ?Commonly known as: ATARAX ?Take 1 tablet (25 mg total) by mouth every 6 (six) hours as needed for anxiety (sleep). ?  ?ibuprofen 600 MG tablet ?Commonly known as: ADVIL ?Take 1 tablet (600 mg total) by mouth every 6 (six) hours. ?  ?norethindrone 0.35 MG tablet ?Commonly known as: MICRONOR ?Take 1 tablet (0.35 mg total) by mouth daily. ?  ?omeprazole 20 MG capsule ?Commonly known as: PRILOSEC ?Take 1 capsule (20 mg total) by mouth daily. ?  ?sertraline 50 MG tablet ?Commonly known as: Zoloft ?Take 1/2 tablet by mouth daily for the first week, then take one tablet daily ?(Take 1 tablet (50 mg total) by mouth daily. Take half a tab daily for the first week, then one tab daily) ?  ?valACYclovir 1000 MG tablet ?Commonly known as: Valtrex ?Take 1 tablet (1,000 mg total) by mouth daily. ?  ? ?  ? ? ?Allergies:  ?No Known Allergies ? ?Family History: ?Family History  ?Problem Relation Age of Onset  ? Arthritis Mother   ? Depression Mother   ? Depression Maternal Grandmother   ? Cancer Maternal Grandmother 40  ?  Breast  ? Cancer Maternal Grandfather   ?     ? type ?bone? liver vs kidney  ? Hearing loss Maternal Grandfather   ? ? ?Social History:  reports that she has quit smoking. She has never used smokeless tobacco. She reports that she does not currently use alcohol. She reports that she does not currently use drugs. ? ? ?Physical Exam: ?BP 113/85   Pulse (!) 102   Ht '5\' 2"'$  (1.575 m)   Wt 144 lb (65.3 kg)   Breastfeeding No   BMI 26.34 kg/m?   ?Constitutional:  Alert and oriented, No acute distress. ?HEENT: [place] AT, moist mucus membranes.  Trachea midline, no masses. ?Cardiovascular: No clubbing, cyanosis, or edema. ?Respiratory: Normal respiratory effort, no increased work of breathing. ?Skin: No rashes, bruises or suspicious  lesions. ?Neurologic: Grossly intact, no focal deficits, moving all 4 extremities. ?Psychiatric: Normal mood and affect. ? ?Laboratory Data: ? ?Lab Results  ?Component Value Date  ? CREATININE 0.71 07/03/2019  ? ?Lab Results  ?Component Value Date  ? HGBA1C 4.6 07/03/2019  ? ? ?Urinalysis ?6- 10 WBCs and >30 epithelial cells  ? ?Pertinent Imaging: ?Results for orders placed or performed in visit on 09/07/21  ?Microscopic Examination  ? Urine  ?Result Value Ref Range  ? WBC, UA 6-10 (A) 0 - 5 /hpf  ? RBC 0-2 0 - 2 /hpf  ? Epithelial Cells (non renal) >10 (A) 0 - 10 /hpf  ? Mucus, UA Present (A) Not Estab.  ? Bacteria, UA Moderate (A) None seen/Few  ?Urinalysis, Complete  ?Result Value Ref Range  ? Specific Gravity, UA >1.030 (H) 1.005 - 1.030  ? pH, UA 5.5 5.0 - 7.5  ? Color, UA Yellow Yellow  ? Appearance Ur Hazy (A) Clear  ? Leukocytes,UA 1+ (A) Negative  ? Protein,UA Negative Negative/Trace  ? Glucose, UA Negative Negative  ? Ketones, UA Negative Negative  ? RBC, UA Negative Negative  ? Bilirubin, UA Negative Negative  ? Urobilinogen, Ur 0.2 0.2 - 1.0 mg/dL  ? Nitrite, UA Negative Negative  ? Microscopic Examination See below:   ?Bladder Scan (Post Void Residual) in office  ?Result Value Ref Range  ? Scan Result 65   ? ? ?Assessment & Plan:   ? ?Incomplete emptying bladder/ bladder neuropathy  ?- UA today looks contaminated she does not feel like she has an infection, no need to culture or treat  ?- She is emptying adequately today with a PVR of 77m. Continue timing voids to prevent from urinary retention.  ?-Retention precautions reviewed ?- We discussed double voiding and crede voiding. Suspect this will improve overtime.   ? ?F/u as needed  ? ?IConley Rollsas a scribe for AHollice Espy MD.,have documented all relevant documentation on the behalf of AHollice Espy MD,as directed by  AHollice Espy MD while in the presence of AHollice Espy MD. ? ?I have reviewed the above documentation for  accuracy and completeness, and I agree with the above.  ? ?AHollice Espy MD ? ? ?[facility]?[address] ?[place] [address]?[phone]? ?

## 2021-09-07 ENCOUNTER — Ambulatory Visit (INDEPENDENT_AMBULATORY_CARE_PROVIDER_SITE_OTHER): Payer: No Typology Code available for payment source | Admitting: Urology

## 2021-09-07 ENCOUNTER — Encounter: Payer: Self-pay | Admitting: Urology

## 2021-09-07 VITALS — BP 113/85 | HR 102 | Ht 62.0 in | Wt 144.0 lb

## 2021-09-07 DIAGNOSIS — R39198 Other difficulties with micturition: Secondary | ICD-10-CM

## 2021-09-07 DIAGNOSIS — R339 Retention of urine, unspecified: Secondary | ICD-10-CM | POA: Diagnosis not present

## 2021-09-07 LAB — URINALYSIS, COMPLETE
Bilirubin, UA: NEGATIVE
Glucose, UA: NEGATIVE
Ketones, UA: NEGATIVE
Nitrite, UA: NEGATIVE
Protein,UA: NEGATIVE
RBC, UA: NEGATIVE
Specific Gravity, UA: >1.03 — ABNORMAL HIGH (ref 1.005–1.030)
Urobilinogen, Ur: 0.2 mg/dL (ref 0.2–1.0)
pH, UA: 5.5 (ref 5.0–7.5)

## 2021-09-07 LAB — MICROSCOPIC EXAMINATION: Epithelial Cells (non renal): >10 /hpf — AB (ref 0–10)

## 2021-09-07 LAB — BLADDER SCAN AMB NON-IMAGING: Scan Result: 65

## 2021-09-12 ENCOUNTER — Encounter: Payer: No Typology Code available for payment source | Admitting: Obstetrics

## 2021-10-02 ENCOUNTER — Ambulatory Visit
Admission: EM | Admit: 2021-10-02 | Discharge: 2021-10-02 | Disposition: A | Discharge status: Home / Self Care | Payer: No Typology Code available for payment source | Attending: Emergency Medicine | Admitting: Emergency Medicine

## 2021-10-02 DIAGNOSIS — H9203 Otalgia, bilateral: Secondary | ICD-10-CM

## 2021-10-02 DIAGNOSIS — J069 Acute upper respiratory infection, unspecified: Secondary | ICD-10-CM

## 2021-10-02 DIAGNOSIS — J029 Acute pharyngitis, unspecified: Secondary | ICD-10-CM

## 2021-10-02 NOTE — Discharge Instructions (Addendum)
Your strep test is negative.  Follow up with your primary care provider if your symptoms are not improving.    

## 2021-10-02 NOTE — ED Triage Notes (Signed)
Pt said since yesterday she started having sore throat and right ear pain. Pt said no fevers, no chills.

## 2021-10-02 NOTE — ED Provider Notes (Signed)
[name]    CSN: [id number] Arrival date & time: 10/02/21  4193      History   Chief Complaint Chief Complaint  Patient presents with   Sore Throat   Otalgia    HPI Elizabeth Berger is a 31 y.o. female.  Patient presents with 1 day history of ear pain, congestion, and sore throat.  No fever, chills, rash, cough, shortness of breath, vomiting, diarrhea, or other symptoms.  Treatment at home with Mucinex cold and flu.  The history is provided by the patient.   Past Medical History:  Diagnosis Date   FH: migraines    mom   Herpes    1&2 IgG+   Migraines     Patient Active Problem List   Diagnosis Date Noted   Post-dates pregnancy 07/02/2021   Generalized anxiety disorder 07/01/2021   Labor and delivery, indication for care 06/03/2021   COVID-19 affecting pregnancy in second trimester 03/28/2021   Back pain affecting pregnancy in second trimester 03/28/2021   Kidney stones    Herpes 09/01/2019   History of kidney stones 04/18/2019   Gastroesophageal reflux disease 04/18/2019   Migraine without status migrainosus, not intractable 09/25/2017   Family history of breast cancer 09/25/2017   Family history of ovarian cancer 09/25/2017    Past Surgical History:  Procedure Laterality Date   TONSILLECTOMY      OB History     Gravida  1   Para  1   Term  1   Preterm  0   AB  0   Living  1      SAB  0   IAB  0   Ectopic  0   Multiple  0   Live Births  1            Home Medications    Prior to Admission medications   Medication Sig Start Date End Date Taking? Authorizing Provider  docusate sodium (COLACE) 100 MG capsule Take 1 capsule (100 mg total) by mouth 2 (two) times daily as needed. 07/02/21   Rubie Maid, MD  ferrous sulfate (FERROUSUL) 325 (65 FE) MG tablet Take 1 tablet (325 mg total) by mouth 2 (two) times daily. 07/02/21   Rubie Maid, MD  hydrOXYzine (ATARAX) 25 MG tablet Take 1 tablet (25 mg total) by mouth every 6  (six) hours as needed for anxiety (sleep). 07/02/21   Rubie Maid, MD  ibuprofen (ADVIL) 600 MG tablet Take 1 tablet (600 mg total) by mouth every 6 (six) hours. 07/02/21   Rubie Maid, MD  norethindrone (MICRONOR) 0.35 MG tablet Take 1 tablet (0.35 mg total) by mouth daily. 08/17/21   Lurlean Horns, CNM  omeprazole (PRILOSEC) 20 MG capsule Take 1 capsule (20 mg total) by mouth daily. 01/11/21   Philip Aspen, CNM  sertraline (ZOLOFT) 50 MG tablet Take 1 tablet (50 mg total) by mouth daily. Take half a tab daily for the first week, then one tab daily 08/17/21   Lurlean Horns, CNM  valACYclovir (VALTREX) 1000 MG tablet Take 1 tablet (1,000 mg total) by mouth daily. 07/11/19   McLean-Scocuzza, Nino Glow, MD    Family History Family History  Problem Relation Age of Onset   Arthritis Mother    Depression Mother    Depression Maternal Grandmother    Cancer Maternal Grandmother 28       Breast   Cancer Maternal Grandfather        ? type ?bone? liver vs  kidney   Hearing loss Maternal Grandfather     Social History Social History   Tobacco Use   Smoking status: Former   Smokeless tobacco: Never   Tobacco comments:    social smoker   Vaping Use   Vaping Use: Never used  Substance Use Topics   Alcohol use: Not Currently   Drug use: Not Currently     Allergies   Patient has no known allergies.   Review of Systems Review of Systems  Constitutional:  Negative for chills and fever.  HENT:  Positive for congestion, ear pain and sore throat.   Respiratory:  Negative for cough and shortness of breath.   Gastrointestinal:  Negative for diarrhea and vomiting.  Skin:  Negative for color change and rash.  All other systems reviewed and are negative.   Physical Exam Triage Vital Signs ED Triage Vitals  Enc Vitals Group     BP 10/02/21 0911 108/77     Pulse Rate 10/02/21 0911 (!) 108     Resp 10/02/21 0911 16     Temp --      Temp Source 10/02/21 0911 Oral     SpO2 10/02/21  0911 98 %     Weight --      Height --      Head Circumference --      Peak Flow --      Pain Score 10/02/21 0910 6     Pain Loc --      Pain Edu? --      Excl. in [place]? --    No data found.  Updated Vital Signs BP 108/77 (BP Location: Left Arm)   Pulse (!) 108 Comment: history of sinus tachy  Temp 98.8 F (37.1 C) (Oral)   Resp 16   LMP 09/05/2021   SpO2 98%   Visual Acuity Right Eye Distance:   Left Eye Distance:   Bilateral Distance:    Right Eye Near:   Left Eye Near:    Bilateral Near:     Physical Exam Vitals and nursing note reviewed.  Constitutional:      General: She is not in acute distress.    Appearance: Normal appearance. She is well-developed. She is not ill-appearing.  HENT:     Right Ear: Tympanic membrane normal.     Left Ear: Tympanic membrane normal.     Nose: Congestion present.     Mouth/Throat:     Mouth: Mucous membranes are moist.     Pharynx: Posterior oropharyngeal erythema present.  Cardiovascular:     Rate and Rhythm: Normal rate and regular rhythm.     Heart sounds: Normal heart sounds.  Pulmonary:     Effort: Pulmonary effort is normal. No respiratory distress.     Breath sounds: Normal breath sounds.  Musculoskeletal:     Cervical back: Neck supple.  Skin:    General: Skin is warm and dry.  Neurological:     Mental Status: She is alert.  Psychiatric:        Mood and Affect: Mood normal.        Behavior: Behavior normal.     UC Treatments / Results  Labs (all labs ordered are listed, but only abnormal results are displayed) Labs Reviewed  POCT RAPID STREP A (OFFICE)    EKG   Radiology No results found.  Procedures Procedures (including critical care time)  Medications Ordered in UC Medications - No data to display  Initial Impression / Assessment and Plan /  UC Course  I have reviewed the triage vital signs and the nursing notes.  Pertinent labs & imaging results that were available during my care of the  patient were reviewed by me and considered in my medical decision making (see chart for details).   Otalgia, sore throat, URI.  Rapid strep negative.  Discussed continued symptomatic treatment.  Instructed patient to follow up with her PCP if her symptoms are not improving.  She agrees to plan of care.     Final Clinical Impressions(s) / UC Diagnoses   Final diagnoses:  Otalgia of both ears  Sore throat  Acute upper respiratory infection     Discharge Instructions      Your strep test is negative.  Follow up with your primary care provider if your symptoms are not improving.        ED Prescriptions   None    PDMP not reviewed this encounter.   Sharion Balloon, NP 10/02/21 [phone]

## 2021-10-13 ENCOUNTER — Encounter: Payer: Self-pay | Admitting: Certified Nurse Midwife

## 2021-10-18 ENCOUNTER — Other Ambulatory Visit: Payer: Self-pay

## 2021-10-19 ENCOUNTER — Other Ambulatory Visit: Payer: Self-pay

## 2021-10-24 ENCOUNTER — Emergency Department
Admission: EM | Admit: 2021-10-24 | Discharge: 2021-10-24 | Disposition: A | Discharge status: Home / Self Care | Payer: No Typology Code available for payment source | Attending: Emergency Medicine | Admitting: Emergency Medicine

## 2021-10-24 ENCOUNTER — Encounter: Payer: Self-pay | Admitting: Emergency Medicine

## 2021-10-24 ENCOUNTER — Other Ambulatory Visit: Payer: Self-pay

## 2021-10-24 DIAGNOSIS — R110 Nausea: Secondary | ICD-10-CM | POA: Insufficient documentation

## 2021-10-24 DIAGNOSIS — R42 Dizziness and giddiness: Secondary | ICD-10-CM

## 2021-10-24 DIAGNOSIS — N3000 Acute cystitis without hematuria: Secondary | ICD-10-CM | POA: Diagnosis not present

## 2021-10-24 DIAGNOSIS — R000 Tachycardia, unspecified: Secondary | ICD-10-CM | POA: Insufficient documentation

## 2021-10-24 LAB — CBC
HCT: 41.7 % (ref 36.0–46.0)
Hemoglobin: 13.4 g/dL (ref 12.0–15.0)
MCH: 28.6 pg (ref 26.0–34.0)
MCHC: 32.1 g/dL (ref 30.0–36.0)
MCV: 88.9 fL (ref 80.0–100.0)
Platelets: 334 10*3/uL (ref 150–400)
RBC: 4.69 MIL/uL (ref 3.87–5.11)
RDW: 13.9 % (ref 11.5–15.5)
WBC: 7.5 10*3/uL (ref 4.0–10.5)
nRBC: 0 % (ref 0.0–0.2)

## 2021-10-24 LAB — URINALYSIS, ROUTINE W REFLEX MICROSCOPIC
Bilirubin Urine: NEGATIVE
Glucose, UA: NEGATIVE mg/dL
Hgb urine dipstick: NEGATIVE
Ketones, ur: NEGATIVE mg/dL
Nitrite: NEGATIVE
Protein, ur: NEGATIVE mg/dL
Specific Gravity, Urine: 1.015 (ref 1.005–1.030)
pH: 5 (ref 5.0–8.0)

## 2021-10-24 LAB — BASIC METABOLIC PANEL
Anion gap: 7 (ref 5–15)
BUN: 13 mg/dL (ref 6–20)
CO2: 22 mmol/L (ref 22–32)
Calcium: 9.2 mg/dL (ref 8.9–10.3)
Chloride: 108 mmol/L (ref 98–111)
Creatinine, Ser: 0.64 mg/dL (ref 0.44–1.00)
GFR, Estimated: >60 mL/min (ref >60)
Glucose, Bld: 110 mg/dL — ABNORMAL HIGH (ref 70–99)
Potassium: 3.7 mmol/L (ref 3.5–5.1)
Sodium: 137 mmol/L (ref 135–145)

## 2021-10-24 LAB — POC URINE PREG, ED: Preg Test, Ur: NEGATIVE

## 2021-10-24 LAB — CBG MONITORING, ED: Glucose-Capillary: 86 mg/dL (ref 70–99)

## 2021-10-24 MED ORDER — NITROFURANTOIN MONOHYD MACRO 100 MG PO CAPS
100.0000 mg | ORAL_CAPSULE | Freq: Two times a day (BID) | ORAL | 0 refills | Status: AC
  Filled 2021-10-24: qty 10, 5d supply, fill #0

## 2021-10-24 MED ORDER — MECLIZINE HCL 25 MG PO CHEW
1.0000 | CHEWABLE_TABLET | Freq: Two times a day (BID) | ORAL | 0 refills | Status: DC | PRN
  Filled 2021-10-24 – 2022-01-25 (×3): qty 5, 3d supply, fill #0

## 2021-10-24 MED ORDER — MECLIZINE HCL 25 MG PO TABS
25.0000 mg | ORAL_TABLET | Freq: Once | ORAL | Status: AC
  Administered 2021-10-24: 25 mg via ORAL
  Filled 2021-10-24: qty 1

## 2021-10-24 MED ORDER — ONDANSETRON HCL 4 MG/2ML IJ SOLN
4.0000 mg | Freq: Once | INTRAMUSCULAR | Status: AC
  Administered 2021-10-24: 4 mg via INTRAVENOUS
  Filled 2021-10-24: qty 2

## 2021-10-24 MED ORDER — LACTATED RINGERS IV BOLUS
1000.0000 mL | Freq: Once | INTRAVENOUS | Status: AC
  Administered 2021-10-24: 1000 mL via INTRAVENOUS

## 2021-10-24 NOTE — ED Provider Notes (Signed)
[facility] Provider Note    Event Date/Time   First MD Initiated Contact with Patient 10/24/21 1522     (approximate)   History   Dizziness   HPI  Elizabeth Berger is a 32 y.o. female with with past medical history of chronic low back pain and urinary retention (patient states she urinates every 2-3 hours to make sure she is emptying her bladder states this is from complications related to childbirth ) as well as migraines presents for evaluation of dizziness.  Patient seen at had a migraine yesterday and was feeling slight dizziness but then had had several episodes of dizziness today.  She states these associate with some nausea and lightheadedness.  They are not triggered by movement or sudden standing and seem to happen spontaneously only lasting a few minutes before self resolving.  No tenderness or decrease in hearing.  She notes she had some URI symptoms 2 weeks ago.  None since then.  She has never had dizziness like this before.  Denies any earache, sore throat, headache, vision changes, vomiting, diarrhea, chest pain, cough, shortness of breath or any focal extremity weakness numbness or tingling.  No recent head trauma.  She is not sure if she is eating or drinking today.  No medications prior to arrival.  No significant EtOH use illicit drug use or new medications.    Past Medical History:  Diagnosis Date   FH: migraines    mom   Herpes    1&2 IgG+   Migraines      Physical Exam  Triage Vital Signs: ED Triage Vitals  Enc Vitals Group     BP 10/24/21 1358 (!) 125/96     Pulse Rate 10/24/21 1358 (!) 102     Resp 10/24/21 1358 16     Temp 10/24/21 1358 98.6 F (37 C)     Temp Source 10/24/21 1358 Oral     SpO2 10/24/21 1358 98 %     Weight 10/24/21 1359 150 lb (68 kg)     Height 10/24/21 1359 5\' 2"  (1.575 m)     Head Circumference --      Peak Flow --      Pain Score 10/24/21 1359 0     Pain Loc --      Pain Edu? --      Excl. in  [place]? --     Most recent vital signs: Vitals:   10/24/21 1358  BP: (!) 125/96  Pulse: (!) 102  Resp: 16  Temp: 98.6 F (37 C)  SpO2: 98%    General: Awake, no distress.  CV:  Good peripheral perfusion.  2+ radial pulses.  No murmur. Resp:  Normal effort.  Clear bilaterally. Abd:  No distention.  Soft throughout. Other:  Cranial nerves II through XII grossly intact.  No pronator drift.  No finger dysmetria.  Symmetric 5/5 strength of all extremities.  Sensation intact to light touch in all extremities.  Unremarkable unassisted gait.  TMs unremarkable laterally.  Oropharynx is unremarkable.   ED Results / Procedures / Treatments  Labs (all labs ordered are listed, but only abnormal results are displayed) Labs Reviewed  BASIC METABOLIC PANEL - Abnormal; Notable for the following components:      Result Value   Glucose, Bld 110 (*)    All other components within normal limits  URINALYSIS, ROUTINE W REFLEX MICROSCOPIC - Abnormal; Notable for the following components:   Color, Urine YELLOW (*)  APPearance HAZY (*)    Leukocytes,Ua MODERATE (*)    Bacteria, UA RARE (*)    All other components within normal limits  URINE CULTURE  CBC  CBG MONITORING, ED  POC URINE PREG, ED     EKG  ECG is remarkable for sinus tachycardia with a ventricular rate of 104, normal axis, some artifact in V2 without any clear evidence of acute ischemia or significant arrhythmia.  Unremarkable intervals.   RADIOLOGY    PROCEDURES:  Critical Care performed: No  Procedures   MEDICATIONS ORDERED IN ED: Medications  lactated ringers bolus 1,000 mL (1,000 mLs Intravenous New Bag/Given 10/24/21 1610)  ondansetron (ZOFRAN) injection 4 mg (4 mg Intravenous Given 10/24/21 1611)  meclizine (ANTIVERT) tablet 25 mg (25 mg Oral Given 10/24/21 1609)     IMPRESSION / MDM / ASSESSMENT AND PLAN / ED COURSE  I reviewed the triage vital signs and the nursing notes. Patient's presentation is most  consistent with acute presentation with potential threat to life or bodily function.                               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  No history of any recent trauma neck or low suspicion for toxic ingestion.  No evidence of deep space infection of the head or neck at this time.  ECG is remarkable for sinus tachycardia with a ventricular rate of 104, normal axis, some artifact in V2 without any clear evidence of acute ischemia or significant arrhythmia.  Unremarkable intervals.  CBC without leukocytosis or acute anemia.  BMP without any significant lecture light or metabolic derangements.  Pregnancy test is negative.  UA appears slightly contaminated with 11-20 squamous of field cells but somewhat difficult to exclude cystitis given moderate leukocyte esterase and 21-50 WBCs with rare bacteria noted.  There is no ketones, bilirubin, blood or glucose.  No protein.  We will send a urine culture and treat with a course of Macrobid as last time patient had a UTI with a culture on 4/10 and was sensitive to Macrobid.  She is feeling much better on my reassessment.  We will give a short course of Antivert and Macrobid.  Discussed returning for any new or worsening symptoms.  Discharged in stable condition.  Strict return cautions advised and discussed.      FINAL CLINICAL IMPRESSION(S) / ED DIAGNOSES   Final diagnoses:  Dizziness  Acute cystitis without hematuria     Rx / DC Orders   ED Discharge Orders          Ordered    nitrofurantoin, macrocrystal-monohydrate, (MACROBID) 100 MG capsule  2 times  daily        10/24/21 1651    Meclizine HCl (ANTIVERT) 25 MG CHEW  2 times daily PRN        10/24/21 1652             Note:  This document was prepared using Dragon voice recognition software and may include unintentional dictation errors.   Gilles Chiquito, MD 10/24/21 [phone]

## 2021-10-25 ENCOUNTER — Other Ambulatory Visit: Payer: Self-pay

## 2021-10-25 LAB — URINE CULTURE: Special Requests: NORMAL

## 2021-11-03 ENCOUNTER — Encounter: Payer: No Typology Code available for payment source | Admitting: Obstetrics

## 2021-11-04 ENCOUNTER — Other Ambulatory Visit: Payer: Self-pay

## 2021-11-07 ENCOUNTER — Ambulatory Visit (INDEPENDENT_AMBULATORY_CARE_PROVIDER_SITE_OTHER): Payer: No Typology Code available for payment source | Admitting: Obstetrics

## 2021-11-07 ENCOUNTER — Other Ambulatory Visit: Payer: Self-pay

## 2021-11-07 ENCOUNTER — Encounter: Payer: Self-pay | Admitting: Obstetrics

## 2021-11-07 VITALS — BP 131/69 | HR 123 | Wt 145.2 lb

## 2021-11-07 DIAGNOSIS — K648 Other hemorrhoids: Secondary | ICD-10-CM | POA: Diagnosis not present

## 2021-11-07 MED ORDER — HYDROCORTISONE ACETATE 25 MG RE SUPP
25.0000 mg | Freq: Two times a day (BID) | RECTAL | 0 refills | Status: DC
  Filled 2021-11-07: qty 12, 6d supply, fill #0

## 2021-11-07 NOTE — Progress Notes (Signed)
GYN ENCOUNTER  Encounter for Hemorrhoids  Subjective  HPI: Elizabeth Berger is a 31 y.o. G1P1001 who presents today for evaluation of painful hemorrhoids. She states that since giving birth, she has had painful internal hemorrhoids. Bowel movements are painful, and she has noticed streaks of blood in her stool. The problem is compounded by constipation. She reports that she is seeing urology for continued issues with urinary retention and was recently treated for a UTI. She also reports that her mood is much improved from the early postpartum periods.  Past Medical History:  Diagnosis Date   FH: migraines    mom   Herpes    1&2 IgG+   Migraines    Past Surgical History:  Procedure Laterality Date   TONSILLECTOMY     OB History     Gravida  1   Para  1   Term  1   Preterm  0   AB  0   Living  1      SAB  0   IAB  0   Ectopic  0   Multiple  0   Live Births  1          No Known Allergies  ROS: Negative except as noted in HPI History obtained from the patient Objective  BP 131/69   Pulse (!) 123   Wt 145 lb 3.2 oz (65.9 kg)   LMP  (LMP Unknown)   BMI 26.56 kg/m   Exam: brief exam shows no external or thrombosed hemorrhoids  Assessment 1) Chronic constipation 2) Internal hemorrhoids  Plan 1) Discussed managing the constipation to decrease straining and pain with bowel movements. Recommended Colace BID, increasing fluid/fiber in diet, and Miralax. 2) Anusol suppositories for hemorrhoids. If no relief with these measures, consider referral to GI for surgical treatment  RTC PRN or for annual visit  Lloyd Huger, CNM

## 2021-12-06 ENCOUNTER — Other Ambulatory Visit: Payer: Self-pay

## 2021-12-22 ENCOUNTER — Other Ambulatory Visit: Payer: Self-pay

## 2022-01-04 ENCOUNTER — Other Ambulatory Visit (HOSPITAL_COMMUNITY): Payer: Self-pay

## 2022-01-12 ENCOUNTER — Other Ambulatory Visit (HOSPITAL_COMMUNITY): Payer: Self-pay

## 2022-01-17 ENCOUNTER — Other Ambulatory Visit (HOSPITAL_COMMUNITY): Payer: Self-pay

## 2022-01-21 ENCOUNTER — Other Ambulatory Visit (HOSPITAL_COMMUNITY): Payer: Self-pay

## 2022-01-24 ENCOUNTER — Ambulatory Visit: Payer: No Typology Code available for payment source | Admitting: Family Medicine

## 2022-01-24 NOTE — Progress Notes (Signed)
New patient visit   Patient: Elizabeth Berger   DOB: 1991-02-03   31 y.o. Female  MRN: [id number] Visit Date: 01/26/2022  Today's healthcare provider: [name], FNP  Patient presents for new patient visit to establish care.  Introduced to Designer, jewellery role and practice setting.  All questions answered.  Discussed provider/patient relationship and expectations.  I,Elizabeth Berger,acting as a scribe for [name], FNP.,have documented all relevant documentation on the behalf of [name], FNP,as directed by  [name], FNP while in the presence of [name], FNP.   Chief Complaint  Patient presents with   Establish Care   Subjective    Elizabeth Berger is a 31 y.o. female who presents today as a new patient to establish care.  HPI HPI   Patient states she has a mole on her chest she wants looked at and possible hemorrhoids.  Last edited by Smitty Knudsen, CMA on 01/26/2022  2:24 PM.       Past Medical History:  Diagnosis Date   Anxiety    Depression    FH: migraines    mom   Herpes    1&2 IgG+   Migraines    Past Surgical History:  Procedure Laterality Date   TONSILLECTOMY     Family Status  Relation Name Status   Mother  Alive   Father  (Not Specified)       unknown    Brother  Alive   MGM  Alive   MGF  Deceased   Family History  Problem Relation Age of Onset   Arthritis Mother    Depression Mother    Cervical cancer Mother    Depression Maternal Grandmother    Cancer Maternal Grandmother 64       Breast   Cancer Maternal Grandfather        ? type ?bone? liver vs kidney   Hearing loss Maternal Grandfather    Social History   Socioeconomic History   Marital status: Single    Spouse name: Devon   Number of children: Not on file   Years of education: Not on file   Highest education level: Not on file  Occupational History   Not on file  Tobacco Use   Smoking status: Former   Smokeless tobacco: Never   Tobacco  comments:    social smoker   Media planner   Vaping Use: Never used  Substance and Sexual Activity   Alcohol use: Not Currently   Drug use: Not Currently   Sexual activity: Not Currently    Partners: Male    Comment: undecided  Other Topics Concern   Not on file  Social History Narrative       [employer] [place]    HS ed    No kids    Wears seat belt, no guns, safe in relationship    Social Determinants of Health   Financial Resource Strain: Not on file  Food Insecurity: Not on file  Transportation Needs: Not on file  Physical Activity: Not on file  Stress: Not on file  Social Connections: Not on file   Outpatient Medications Prior to Visit  Medication Sig   docusate sodium (COLACE) 100 MG capsule Take 1 capsule (100 mg total) by mouth 2 (two) times daily as needed.   ferrous sulfate (FERROUSUL) 325 (65 FE) MG tablet Take 1 tablet (325 mg total) by mouth 2 (two) times daily.  hydrocortisone (ANUSOL-HC) 25 MG suppository Place 1 suppository (25 mg total) rectally 2 (two) times daily.   hydrOXYzine (ATARAX) 25 MG tablet Take 1 tablet (25 mg total) by mouth every 6 (six) hours as needed for anxiety (sleep).   ibuprofen (ADVIL) 600 MG tablet Take 1 tablet (600 mg total) by mouth every 6 (six) hours.   Meclizine HCl (ANTIVERT) 25 MG CHEW Chew 1 tablet by mouth 2 times daily as needed   norethindrone (MICRONOR) 0.35 MG tablet Take 1 tablet (0.35 mg total) by mouth daily.   sertraline (ZOLOFT) 50 MG tablet Take 1 tablet (50 mg total) by mouth daily. Take half a tab daily for the first week, then one tab daily   valACYclovir (VALTREX) 1000 MG tablet Take 1 tablet (1,000 mg total) by mouth daily.   [DISCONTINUED] omeprazole (PRILOSEC) 20 MG capsule Take 1 capsule (20 mg total) by mouth daily.   No facility-administered medications prior to visit.   No Known Allergies  Immunization History  Administered Date(s) Administered   Influenza,inj,Quad PF,6+  Mos 01/11/2021, 01/26/2022   Influenza-Unspecified 02/26/2020   PFIZER(Purple Top)SARS-COV-2 Vaccination 01/30/2020, 03/01/2020   Tdap 11/06/2017, 04/04/2021    Health Maintenance  Topic Date Due   COVID-19 Vaccine (3 - Pfizer risk series) 03/29/2020   PAP-Cervical Cytology Screening  12/14/2023   PAP SMEAR-Modifier  12/14/2023   TETANUS/TDAP  04/05/2031   INFLUENZA VACCINE  Completed   Hepatitis C Screening  Completed   HIV Screening  Completed   HPV VACCINES  Aged Out    Patient Care Team: [name], FNP as PCP - General (Family Medicine) McLean-Scocuzza, Nino Glow, MD (Internal Medicine)  Review of Systems  Constitutional:  Positive for fatigue.  Gastrointestinal:  Positive for abdominal distention, constipation, diarrhea and rectal pain.  Neurological:  Positive for dizziness, tremors and light-headedness.  Psychiatric/Behavioral:  Positive for confusion and decreased concentration.     Last CBC Lab Results  Component Value Date   WBC 7.5 10/24/2021   HGB 13.4 10/24/2021   HCT 41.7 10/24/2021   MCV 88.9 10/24/2021   MCH 28.6 10/24/2021   RDW 13.9 10/24/2021   PLT 334 25/08/3974   Last metabolic panel Lab Results  Component Value Date   GLUCOSE 110 (H) 10/24/2021   NA 137 10/24/2021   K 3.7 10/24/2021   CL 108 10/24/2021   CO2 22 10/24/2021   BUN 13 10/24/2021   CREATININE 0.64 10/24/2021   GFRNONAA >60 10/24/2021   CALCIUM 9.2 10/24/2021   PROT 7.9 07/03/2019   ALBUMIN 4.6 07/03/2019   BILITOT 0.3 07/03/2019   ALKPHOS 70 07/03/2019   AST 16 07/03/2019   ALT 16 07/03/2019   ANIONGAP 7 10/24/2021   Last lipids Lab Results  Component Value Date   CHOL 191 02/14/2019   HDL 76 02/14/2019   LDLCALC 103 (H) 02/14/2019   TRIG 67 02/14/2019   CHOLHDL 2.5 02/14/2019   Last hemoglobin A1c Lab Results  Component Value Date   HGBA1C 4.6 07/03/2019   Last thyroid functions Lab Results  Component Value Date   TSH 2.700 05/19/2021   T4TOTAL 8.4  03/02/2020   Last vitamin D Lab Results  Component Value Date   VD25OH 31 10/31/2017     Objective    BP 114/84 (BP Location: Right Arm, Patient Position: Sitting, Cuff Size: Normal)   Pulse 94   Resp 16   Ht '5\' 2"'$  (1.575 m)   Wt 147 lb (66.7 kg)   SpO2 100%  BMI 26.89 kg/m   BP Readings from Last 3 Encounters:  01/26/22 114/84  11/07/21 131/69  10/24/21 122/87   Wt Readings from Last 3 Encounters:  01/26/22 147 lb (66.7 kg)  11/07/21 145 lb 3.2 oz (65.9 kg)  10/24/21 150 lb (68 kg)   SpO2 Readings from Last 3 Encounters:  01/26/22 100%  10/24/21 98%  10/02/21 98%   Physical Exam Vitals and nursing note reviewed.  Constitutional:      General: She is not in acute distress.    Appearance: Normal appearance. She is overweight. She is not ill-appearing, toxic-appearing or diaphoretic.  HENT:     Head: Normocephalic and atraumatic.  Cardiovascular:     Rate and Rhythm: Normal rate and regular rhythm.     Pulses: Normal pulses.     Heart sounds: Normal heart sounds. No murmur heard.    No friction rub. No gallop.  Pulmonary:     Effort: Pulmonary effort is normal. No respiratory distress.     Breath sounds: Normal breath sounds. No stridor. No wheezing, rhonchi or rales.  Chest:     Chest wall: No tenderness.  Abdominal:     General: Bowel sounds are normal.     Palpations: Abdomen is soft.     Comments: Reports concern for internal hemorrhoids given acute internal swelling following BM; denies straining for BMs  Musculoskeletal:        General: No swelling, tenderness, deformity or signs of injury. Normal range of motion.     Right lower leg: No edema.     Left lower leg: No edema.  Skin:    General: Skin is warm and dry.     Capillary Refill: Capillary refill takes less than 2 seconds.     Coloration: Skin is not jaundiced or pale.     Findings: No bruising, erythema, lesion or rash.  Neurological:     General: No focal deficit present.     Mental  Status: She is alert and oriented to person, place, and time. Mental status is at baseline.     Cranial Nerves: No cranial nerve deficit.     Sensory: No sensory deficit.     Motor: No weakness.     Coordination: Coordination normal.  Psychiatric:        Mood and Affect: Mood normal.        Behavior: Behavior normal.        Thought Content: Thought content normal.        Judgment: Judgment normal.    Depression Screen    01/26/2022    2:25 PM 11/29/2020    3:20 PM 04/18/2019   10:51 AM 09/25/2017   11:03 AM  PHQ 2/9 Scores  PHQ - 2 Score 1 0 0 0  PHQ- 9 Score 8      No results found for any visits on 01/26/22.  Assessment & Plan      Problem List Items Addressed This Visit       Cardiovascular and Mediastinum   Internal hemorrhoids    Chronic, reports symptoms in past 7 months since childbirth Reports internal rectal edema following BM Referral to GI      Relevant Orders   Ambulatory referral to Gastroenterology     Musculoskeletal and Integument   Atypical nevi    0.8 cm x 0.3 cm x 0.3 cm under L breast along bra line; pt reports change in color, diameter, shape and appearance Feels like it is "about to fall off" Referral  to Derm      Relevant Orders   Ambulatory referral to Dermatology     Other   Chronic fatigue - Primary    +PHQ 9  Treated for PPA and PPD Has a 38 month old       Relevant Orders   Vitamin D (25 hydroxy)   B12 and Folate Panel   Vitamin B6   Elevated LDL cholesterol level    LDL previously elevated >100; will repeat LP for ASCVD screening       Relevant Orders   Comprehensive Metabolic Panel (CMET)   CBC with Differential/Platelet   Lipid panel   Elevated serum glucose    Recommend DM screening given previous elevation in serum glucose       Relevant Orders   Hemoglobin A1c   Missed period    Reports 3-4 periods since having daughter 7 months ago; is on OCPs however, has missed a few Would plan to keep pregnancy if she is  pregnant again; however, would add financial strain d/t childcare costs      Relevant Orders   hCG, serum, qualitative   Beta HCG, Quant   Need for influenza vaccination    Consented; VIS made available; no immediate side effects following administration; plan to repeat annually       Relevant Orders   Flu Vaccine QUAD 6+ mos PF IM (Fluarix Quad PF) (Completed)   Screening for thyroid disorder    Recommend screening for thyroid with concern for fatigue and tremor       Relevant Orders   TSH + free T4   Tremor of both hands    Acute R>L; denies known cause or injury reports occurs when holding something outstretched Notes PPA and PPD; PHQ 9 reflects Is currently on zoloft 50 mg from [facility] and atarax 25 mg PRN Recommend B/D vitamins and TSH      Relevant Orders   TSH + free T4   Vitamin D (25 hydroxy)   B12 and Folate Panel   Vitamin B6   Return in about 6 months (around 07/27/2022) for annual examination.     Vonna Kotyk, FNP, have reviewed all documentation for this visit. The documentation on 01/26/22 for the exam, diagnosis, procedures, and orders are all accurate and complete.  [name], [place] [phone] (phone) [phone] (fax)  [place]

## 2022-01-25 ENCOUNTER — Other Ambulatory Visit: Payer: Self-pay

## 2022-01-25 ENCOUNTER — Other Ambulatory Visit: Payer: Self-pay | Admitting: Certified Nurse Midwife

## 2022-01-26 ENCOUNTER — Other Ambulatory Visit: Payer: Self-pay

## 2022-01-26 ENCOUNTER — Ambulatory Visit (INDEPENDENT_AMBULATORY_CARE_PROVIDER_SITE_OTHER): Payer: No Typology Code available for payment source | Admitting: Family Medicine

## 2022-01-26 ENCOUNTER — Encounter: Payer: Self-pay | Admitting: Family Medicine

## 2022-01-26 VITALS — BP 114/84 | HR 94 | Resp 16 | Ht 62.0 in | Wt 147.0 lb

## 2022-01-26 DIAGNOSIS — N926 Irregular menstruation, unspecified: Secondary | ICD-10-CM | POA: Insufficient documentation

## 2022-01-26 DIAGNOSIS — R251 Tremor, unspecified: Secondary | ICD-10-CM | POA: Insufficient documentation

## 2022-01-26 DIAGNOSIS — D229 Melanocytic nevi, unspecified: Secondary | ICD-10-CM | POA: Insufficient documentation

## 2022-01-26 DIAGNOSIS — Z23 Encounter for immunization: Secondary | ICD-10-CM | POA: Insufficient documentation

## 2022-01-26 DIAGNOSIS — K648 Other hemorrhoids: Secondary | ICD-10-CM | POA: Insufficient documentation

## 2022-01-26 DIAGNOSIS — E7800 Pure hypercholesterolemia, unspecified: Secondary | ICD-10-CM | POA: Insufficient documentation

## 2022-01-26 DIAGNOSIS — R739 Hyperglycemia, unspecified: Secondary | ICD-10-CM | POA: Insufficient documentation

## 2022-01-26 DIAGNOSIS — Z1329 Encounter for screening for other suspected endocrine disorder: Secondary | ICD-10-CM | POA: Insufficient documentation

## 2022-01-26 DIAGNOSIS — R5382 Chronic fatigue, unspecified: Secondary | ICD-10-CM | POA: Insufficient documentation

## 2022-01-26 MED ORDER — OMEPRAZOLE 20 MG PO CPDR
20.0000 mg | DELAYED_RELEASE_CAPSULE | Freq: Every day | ORAL | 3 refills | Status: DC
  Filled 2022-01-26: qty 90, 90d supply, fill #0
  Filled 2022-04-26 – 2022-10-05 (×2): qty 90, 90d supply, fill #1

## 2022-01-26 NOTE — Assessment & Plan Note (Signed)
Recommend screening for thyroid with concern for fatigue and tremor

## 2022-01-26 NOTE — Assessment & Plan Note (Signed)
Acute R>L; denies known cause or injury reports occurs when holding something outstretched Notes PPA and PPD; PHQ 9 reflects Is currently on zoloft 50 mg from [facility] and atarax 25 mg PRN Recommend B/D vitamins and TSH

## 2022-01-26 NOTE — Assessment & Plan Note (Signed)
Consented; VIS made available; no immediate side effects following administration; plan to repeat annually   

## 2022-01-26 NOTE — Assessment & Plan Note (Signed)
Recommend DM screening given previous elevation in serum glucose

## 2022-01-26 NOTE — Assessment & Plan Note (Signed)
0.8 cm x 0.3 cm x 0.3 cm under L breast along bra line; pt reports change in color, diameter, shape and appearance Feels like it is "about to fall off" Referral to [facility]

## 2022-01-26 NOTE — Assessment & Plan Note (Signed)
LDL previously elevated >100; will repeat LP for ASCVD screening

## 2022-01-26 NOTE — Assessment & Plan Note (Signed)
Chronic, reports symptoms in past 7 months since childbirth Reports internal rectal edema following BM Referral to GI

## 2022-01-26 NOTE — Assessment & Plan Note (Signed)
Reports 3-4 periods since having daughter 7 months ago; is on OCPs however, has missed a few Would plan to keep pregnancy if she is pregnant again; however, would add financial strain d/t childcare costs

## 2022-01-26 NOTE — Assessment & Plan Note (Signed)
+  PHQ 9  Treated for PPA and PPD Has a 26 month old

## 2022-01-27 NOTE — Progress Notes (Signed)
Hi [name], Chemistry is stable; lipid panel shows slight elevation in bad/LDL cholesterol. I recommend diet low in saturated fat and regular exercise - 30 min at least 5 times per week. Vitamin D is low; recommend 2000 IU OTC to assist; or I can also Rx a once/week dose if that would be easier to remember. hCG was <1 which confirms that you are not pregnant.   [name], [address] [place], [place] [address] [phone] (phone) [phone] (fax) [place]

## 2022-01-30 ENCOUNTER — Ambulatory Visit (INDEPENDENT_AMBULATORY_CARE_PROVIDER_SITE_OTHER): Payer: No Typology Code available for payment source | Admitting: Dermatology

## 2022-01-30 DIAGNOSIS — D225 Melanocytic nevi of trunk: Secondary | ICD-10-CM | POA: Diagnosis not present

## 2022-01-30 DIAGNOSIS — D492 Neoplasm of unspecified behavior of bone, soft tissue, and skin: Secondary | ICD-10-CM

## 2022-01-30 NOTE — Patient Instructions (Signed)
Wound Care Instructions  Cleanse wound gently with soap and water once a day then pat dry with clean gauze. Apply a thin coat of Petrolatum (petroleum jelly, "Vaseline") over the wound (unless you have an allergy to this). We recommend that you use a new, sterile tube of Vaseline. Do not pick or remove scabs. Do not remove the yellow or white "healing tissue" from the base of the wound.  Cover the wound with fresh, clean, nonstick gauze and secure with paper tape. You may use Band-Aids in place of gauze and tape if the wound is small enough, but would recommend trimming much of the tape off as there is often too much. Sometimes Band-Aids can irritate the skin.  You should call the office for your biopsy report after 1 week if you have not already been contacted.  If you experience any problems, such as abnormal amounts of bleeding, swelling, significant bruising, significant pain, or evidence of infection, please call the office immediately.  FOR ADULT SURGERY PATIENTS: If you need something for pain relief you may take 1 extra strength Tylenol (acetaminophen) AND 2 Ibuprofen (200mg each) together every 4 hours as needed for pain. (do not take these if you are allergic to them or if you have a reason you should not take them.) Typically, you may only need pain medication for 1 to 3 days.     Due to recent changes in healthcare laws, you may see results of your pathology and/or laboratory studies on MyChart before the doctors have had a chance to review them. We understand that in some cases there may be results that are confusing or concerning to you. Please understand that not all results are received at the same time and often the doctors may need to interpret multiple results in order to provide you with the best plan of care or course of treatment. Therefore, we ask that you please give us 2 business days to thoroughly review all your results before contacting the office for clarification. Should  we see a critical lab result, you will be contacted sooner.   If You Need Anything After Your Visit  If you have any questions or concerns for your doctor, please call our main line at [phone] and press option 4 to reach your doctor's medical assistant. If no one answers, please leave a voicemail as directed and we will return your call as soon as possible. Messages left after 4 pm will be answered the following business day.   You may also send us a message via MyChart. We typically respond to MyChart messages within 1-2 business days.  For prescription refills, please ask your pharmacy to contact our office. Our fax number is [phone].  If you have an urgent issue when the clinic is closed that cannot wait until the next business day, you can page your doctor at the number below.    Please note that while we do our best to be available for urgent issues outside of office hours, we are not available 24/7.   If you have an urgent issue and are unable to reach us, you may choose to seek medical care at your doctor's office, retail clinic, urgent care center, or emergency room.  If you have a medical emergency, please immediately call 911 or go to the emergency department.  Pager Numbers  - Dr. Kowalski: [phone]  - Dr. Moye: [phone]  - Dr. Stewart: [phone]  In the event of inclement weather, please call our main line at   [phone] for an update on the status of any delays or closures.  Dermatology Medication Tips: Please keep the boxes that topical medications come in in order to help keep track of the instructions about where and how to use these. Pharmacies typically print the medication instructions only on the boxes and not directly on the medication tubes.   If your medication is too expensive, please contact our office at [phone] option 4 or send us a message through MyChart.   We are unable to tell what your co-pay for medications will be in  advance as this is different depending on your insurance coverage. However, we may be able to find a substitute medication at lower cost or fill out paperwork to get insurance to cover a needed medication.   If a prior authorization is required to get your medication covered by your insurance company, please allow us 1-2 business days to complete this process.  Drug prices often vary depending on where the prescription is filled and some pharmacies may offer cheaper prices.  The website www.goodrx.com contains coupons for medications through different pharmacies. The prices here do not account for what the cost may be with help from insurance (it may be cheaper with your insurance), but the website can give you the price if you did not use any insurance.  - You can print the associated coupon and take it with your prescription to the pharmacy.  - You may also stop by our office during regular business hours and pick up a GoodRx coupon card.  - If you need your prescription sent electronically to a different pharmacy, notify our office through [facility] MyChart or by phone at [phone] option 4.     Si Usted Necesita Algo Despus de Su Visita  Tambin puede enviarnos un mensaje a travs de MyChart. Por lo general respondemos a los mensajes de MyChart en el transcurso de 1 a 2 das hbiles.  Para renovar recetas, por favor pida a su farmacia que se ponga en contacto con nuestra oficina. Nuestro nmero de fax es el [phone].  Si tiene un asunto urgente cuando la clnica est cerrada y que no puede esperar hasta el siguiente da hbil, puede llamar/localizar a su doctor(a) al nmero que aparece a continuacin.   Por favor, tenga en cuenta que aunque hacemos todo lo posible para estar disponibles para asuntos urgentes fuera del horario de oficina, no estamos disponibles las 24 horas del da, los 7 das de la semana.   Si tiene un problema urgente y no puede comunicarse con nosotros, puede  optar por buscar atencin mdica  en el consultorio de su doctor(a), en una clnica privada, en un centro de atencin urgente o en una sala de emergencias.  Si tiene una emergencia mdica, por favor llame inmediatamente al 911 o vaya a la sala de emergencias.  Nmeros de bper  - Dr. Kowalski: [phone]  - Dra. Moye: [phone]  - Dra. Stewart: [phone]  En caso de inclemencias del tiempo, por favor llame a nuestra lnea principal al [phone] para una actualizacin sobre el estado de cualquier retraso o cierre.  Consejos para la medicacin en dermatologa: Por favor, guarde las cajas en las que vienen los medicamentos de uso tpico para ayudarle a seguir las instrucciones sobre dnde y cmo usarlos. Las farmacias generalmente imprimen las instrucciones del medicamento slo en las cajas y no directamente en los tubos del medicamento.   Si su medicamento es muy caro, por favor, pngase en contacto con   nuestra oficina llamando al [phone] y presione la opcin 4 o envenos un mensaje a travs de MyChart.   No podemos decirle cul ser su copago por los medicamentos por adelantado ya que esto es diferente dependiendo de la cobertura de su seguro. Sin embargo, es posible que podamos encontrar un medicamento sustituto a menor costo o llenar un formulario para que el seguro cubra el medicamento que se considera necesario.   Si se requiere una autorizacin previa para que su compaa de seguros cubra su medicamento, por favor permtanos de 1 a 2 das hbiles para completar este proceso.  Los precios de los medicamentos varan con frecuencia dependiendo del lugar de dnde se surte la receta y alguna farmacias pueden ofrecer precios ms baratos.  El sitio web www.goodrx.com tiene cupones para medicamentos de diferentes farmacias. Los precios aqu no tienen en cuenta lo que podra costar con la ayuda del seguro (puede ser ms barato con su seguro), pero el sitio web puede darle el  precio si no utiliz ningn seguro.  - Puede imprimir el cupn correspondiente y llevarlo con su receta a la farmacia.  - Tambin puede pasar por nuestra oficina durante el horario de atencin regular y recoger una tarjeta de cupones de GoodRx.  - Si necesita que su receta se enve electrnicamente a una farmacia diferente, informe a nuestra oficina a travs de MyChart de [place] o por telfono llamando al [phone] y presione la opcin 4.  

## 2022-01-30 NOTE — Progress Notes (Signed)
   New Patient Visit  Subjective  Elizabeth Berger is a 31 y.o. female who presents for the following: Nevus (At bra line. Dur: >20 years. Has changed recently. Color has changed and mole appears stretched out looking since pregnancy ~1 year ago). The patient has spots, moles and lesions to be evaluated, some may be new or changing and the patient has concerns that these could be cancer.  Review of Systems: No other skin or systemic complaints except as noted in HPI or Assessment and Plan.  Objective  Well appearing patient in no apparent distress; mood and affect are within normal limits.  A focused examination was performed including abdomen, chest. Relevant physical exam findings are noted in the Assessment and Plan.  Left Inframammary Fold 0.8cm dark brown flesh papule      Assessment & Plan  Neoplasm of skin Left Inframammary Fold  Epidermal / dermal shaving  Lesion diameter (cm):  0.8 Informed consent: discussed and consent obtained   Timeout: patient name, date of birth, surgical site, and procedure verified   Procedure prep:  Patient was prepped and draped in usual sterile fashion Prep type:  Isopropyl alcohol Anesthesia: the lesion was anesthetized in a standard fashion   Anesthetic:  1% lidocaine w/ epinephrine 1-100,000 buffered w/ 8.4% NaHCO3 Instrument used: scissors   Hemostasis achieved with: pressure, aluminum chloride and electrodesiccation   Outcome: patient tolerated procedure well   Post-procedure details: sterile dressing applied and wound care instructions given   Dressing type: bandage and petrolatum    Specimen 1 - Surgical pathology Differential Diagnosis: R/O atypia in changing mole  Check Margins: No   Return if symptoms worsen or fail to improve. Documentation: I have reviewed the above documentation for accuracy and completeness, and I agree with the above.  Sarina Ser, MD

## 2022-02-01 ENCOUNTER — Telehealth: Payer: Self-pay

## 2022-02-01 NOTE — Telephone Encounter (Signed)
Advised pt of bx results/sh ?

## 2022-02-01 NOTE — Telephone Encounter (Signed)
-----   Message from Ralene Bathe, MD sent at 01/31/2022  6:44 PM EDT ----- Diagnosis Skin , left inframammary fold MELANOCYTIC NEVUS, COMPOUND TYPE, BASE INVOLVED  Benign mole No further treatment needed

## 2022-02-03 ENCOUNTER — Encounter: Payer: Self-pay | Admitting: Dermatology

## 2022-02-10 ENCOUNTER — Encounter: Payer: Self-pay | Admitting: Obstetrics

## 2022-02-14 LAB — TSH+FREE T4
Free T4: 1.22 ng/dL (ref 0.82–1.77)
TSH: 1.53 u[IU]/mL (ref 0.450–4.500)

## 2022-02-14 LAB — COMPREHENSIVE METABOLIC PANEL
ALT: 13 IU/L (ref 0–32)
AST: 15 IU/L (ref 0–40)
Albumin/Globulin Ratio: 1.8 (ref 1.2–2.2)
Albumin: 4.6 g/dL (ref 4.0–5.0)
Alkaline Phosphatase: 98 IU/L (ref 44–121)
BUN/Creatinine Ratio: 18 (ref 9–23)
BUN: 13 mg/dL (ref 6–20)
Bilirubin Total: <0.2 mg/dL (ref 0.0–1.2)
CO2: 19 mmol/L — ABNORMAL LOW (ref 20–29)
Calcium: 9.5 mg/dL (ref 8.7–10.2)
Chloride: 104 mmol/L (ref 96–106)
Creatinine, Ser: 0.72 mg/dL (ref 0.57–1.00)
Globulin, Total: 2.6 g/dL (ref 1.5–4.5)
Glucose: 85 mg/dL (ref 70–99)
Potassium: 4.5 mmol/L (ref 3.5–5.2)
Sodium: 140 mmol/L (ref 134–144)
Total Protein: 7.2 g/dL (ref 6.0–8.5)
eGFR: 117 mL/min/{1.73_m2} (ref >59)

## 2022-02-14 LAB — CBC WITH DIFFERENTIAL/PLATELET
Basophils Absolute: 0.1 10*3/uL (ref 0.0–0.2)
Basos: 1 %
EOS (ABSOLUTE): 0.3 10*3/uL (ref 0.0–0.4)
Eos: 5 %
Hematocrit: 42.9 % (ref 34.0–46.6)
Hemoglobin: 14.4 g/dL (ref 11.1–15.9)
Immature Grans (Abs): 0 10*3/uL (ref 0.0–0.1)
Immature Granulocytes: 0 %
Lymphocytes Absolute: 2 10*3/uL (ref 0.7–3.1)
Lymphs: 29 %
MCH: 30.3 pg (ref 26.6–33.0)
MCHC: 33.6 g/dL (ref 31.5–35.7)
MCV: 90 fL (ref 79–97)
Monocytes Absolute: 0.5 10*3/uL (ref 0.1–0.9)
Monocytes: 7 %
Neutrophils Absolute: 3.9 10*3/uL (ref 1.4–7.0)
Neutrophils: 58 %
Platelets: 298 10*3/uL (ref 150–450)
RBC: 4.75 x10E6/uL (ref 3.77–5.28)
RDW: 13 % (ref 11.7–15.4)
WBC: 6.7 10*3/uL (ref 3.4–10.8)

## 2022-02-14 LAB — VITAMIN D 25 HYDROXY (VIT D DEFICIENCY, FRACTURES): Vit D, 25-Hydroxy: 24 ng/mL — ABNORMAL LOW (ref 30.0–100.0)

## 2022-02-14 LAB — HEMOGLOBIN A1C
Est. average glucose Bld gHb Est-mCnc: 94 mg/dL
Hgb A1c MFr Bld: 4.9 % (ref 4.8–5.6)

## 2022-02-14 LAB — HCG, SERUM, QUALITATIVE

## 2022-02-14 LAB — LIPID PANEL
Chol/HDL Ratio: 4.3 ratio (ref 0.0–4.4)
Cholesterol, Total: 173 mg/dL (ref 100–199)
HDL: 40 mg/dL (ref >39)
LDL Chol Calc (NIH): 108 mg/dL — ABNORMAL HIGH (ref 0–99)
Triglycerides: 143 mg/dL (ref 0–149)
VLDL Cholesterol Cal: 25 mg/dL (ref 5–40)

## 2022-02-14 LAB — B12 AND FOLATE PANEL
Folate: 4.5 ng/mL (ref >3.0)
Vitamin B-12: 589 pg/mL (ref 232–1245)

## 2022-02-14 LAB — BETA HCG QUANT (REF LAB): hCG Quant: <1 m[IU]/mL

## 2022-02-14 LAB — VITAMIN B6: Vitamin B6: 9.2 ug/L (ref 3.4–65.2)

## 2022-02-16 ENCOUNTER — Encounter: Payer: Medicaid Other | Admitting: Obstetrics

## 2022-02-20 ENCOUNTER — Encounter: Payer: No Typology Code available for payment source | Admitting: Obstetrics

## 2022-04-26 ENCOUNTER — Other Ambulatory Visit: Payer: Self-pay | Admitting: Family Medicine

## 2022-04-26 ENCOUNTER — Other Ambulatory Visit: Payer: Self-pay

## 2022-04-26 ENCOUNTER — Encounter: Payer: Self-pay | Admitting: Family Medicine

## 2022-04-26 DIAGNOSIS — J069 Acute upper respiratory infection, unspecified: Secondary | ICD-10-CM | POA: Insufficient documentation

## 2022-04-26 MED ORDER — ALBUTEROL SULFATE HFA 108 (90 BASE) MCG/ACT IN AERS
2.0000 | INHALATION_SPRAY | Freq: Four times a day (QID) | RESPIRATORY_TRACT | 2 refills | Status: DC | PRN
  Filled 2022-04-26: qty 6.7, 30d supply, fill #0

## 2022-04-26 MED ORDER — OPTICHAMBER ADVANTAGE-LG MASK MISC
1.0000 | 0 refills | Status: DC | PRN
  Filled 2022-04-26: qty 1, 1d supply, fill #0

## 2022-05-09 ENCOUNTER — Other Ambulatory Visit: Payer: Self-pay

## 2022-05-25 ENCOUNTER — Other Ambulatory Visit (HOSPITAL_COMMUNITY): Payer: Self-pay

## 2022-05-25 ENCOUNTER — Other Ambulatory Visit: Payer: Self-pay

## 2022-05-25 ENCOUNTER — Encounter: Payer: Self-pay | Admitting: Certified Nurse Midwife

## 2022-05-25 MED ORDER — NORETHINDRONE 0.35 MG PO TABS
1.0000 | ORAL_TABLET | Freq: Every day | ORAL | 1 refills | Status: DC
  Filled 2022-05-25: qty 84, 84d supply, fill #0

## 2022-05-25 MED ORDER — NORETHINDRONE 0.35 MG PO TABS
1.0000 | ORAL_TABLET | Freq: Every day | ORAL | 1 refills | Status: DC
  Filled 2022-05-25 – 2022-05-26 (×2): qty 84, 84d supply, fill #0
  Filled 2022-10-05: qty 84, 84d supply, fill #1

## 2022-05-25 NOTE — Addendum Note (Signed)
Addended by: Cleophas Dunker D on: 05/25/2022 03:30 PM   Modules accepted: Orders

## 2022-05-26 ENCOUNTER — Other Ambulatory Visit: Payer: Self-pay

## 2022-05-29 ENCOUNTER — Encounter: Payer: Self-pay | Admitting: Family Medicine

## 2022-05-29 ENCOUNTER — Encounter: Payer: Self-pay | Admitting: Dermatology

## 2022-05-30 ENCOUNTER — Ambulatory Visit: Payer: 59 | Admitting: Dermatology

## 2022-06-06 ENCOUNTER — Ambulatory Visit (INDEPENDENT_AMBULATORY_CARE_PROVIDER_SITE_OTHER): Payer: 59 | Admitting: Dermatology

## 2022-06-06 ENCOUNTER — Ambulatory Visit (INDEPENDENT_AMBULATORY_CARE_PROVIDER_SITE_OTHER): Payer: Medicaid Other | Admitting: Gastroenterology

## 2022-06-06 ENCOUNTER — Other Ambulatory Visit: Payer: Self-pay

## 2022-06-06 ENCOUNTER — Encounter: Payer: Self-pay | Admitting: Gastroenterology

## 2022-06-06 ENCOUNTER — Encounter: Payer: Self-pay | Admitting: Dermatology

## 2022-06-06 VITALS — BP 110/78 | HR 98 | Temp 98.1°F | Ht 62.0 in | Wt 141.4 lb

## 2022-06-06 VITALS — BP 120/77 | HR 101

## 2022-06-06 DIAGNOSIS — L821 Other seborrheic keratosis: Secondary | ICD-10-CM | POA: Diagnosis not present

## 2022-06-06 DIAGNOSIS — K5909 Other constipation: Secondary | ICD-10-CM

## 2022-06-06 DIAGNOSIS — Z79899 Other long term (current) drug therapy: Secondary | ICD-10-CM

## 2022-06-06 DIAGNOSIS — L304 Erythema intertrigo: Secondary | ICD-10-CM

## 2022-06-06 MED ORDER — MOMETASONE FUROATE 0.1 % EX CREA
TOPICAL_CREAM | CUTANEOUS | 1 refills | Status: DC
  Filled 2022-06-06: qty 15, 15d supply, fill #0

## 2022-06-06 NOTE — Progress Notes (Signed)
   Follow-Up Visit   Subjective  Elizabeth Berger is a 32 y.o. female who presents for the following: Rash (Breast area. Has improved since making appointment ).  The following portions of the chart were reviewed this encounter and updated as appropriate:  Tobacco  Allergies  Meds  Problems  Med Hx  Surg Hx  Fam Hx     Review of Systems: No other skin or systemic complaints except as noted in HPI or Assessment and Plan.  Objective  Well appearing patient in no apparent distress; mood and affect are within normal limits.  A focused examination was performed including chest. Relevant physical exam findings are noted in the Assessment and Plan.  Inframammary Folds Scattered erythematous papules   Assessment & Plan  Erythema intertrigo Inframammary Folds Intertrigo is a chronic recurrent rash that occurs in skin fold areas that may be associated with friction; heat; moisture; yeast; fungus; and bacteria.  It is exacerbated by increased movement / activity; sweating; and higher atmospheric temperature. Chronic and persistent condition with duration or expected duration over one year. Condition is bothersome/symptomatic for patient. Currently flared.  Start Mometasone cream twice daily up to 1 week as needed for flares.  Topical steroids (such as triamcinolone, fluocinolone, fluocinonide, mometasone, clobetasol, halobetasol, betamethasone, hydrocortisone) can cause thinning and lightening of the skin if they are used for too long in the same area. Your physician has selected the right strength medicine for your problem and area affected on the body. Please use your medication only as directed by your physician to prevent side effects.   mometasone (ELOCON) 0.1 % cream - Inframammary Folds Apply twice daily to affected areas up to 1 week as needed for rash  Seborrheic Keratoses. Face  - Stuck-on, waxy, tan-brown papules and/or plaques  - Benign-appearing - Discussed benign  etiology and prognosis. - Observe - Call for any changes  Return if symptoms worsen or fail to improve.  I, Lawson Radar, CMA, am acting as scribe for Armida Sans, MD. Documentation: I have reviewed the above documentation for accuracy and completeness, and I agree with the above.  Armida Sans, MD

## 2022-06-06 NOTE — Patient Instructions (Addendum)
Start Mometasone cream twice daily up to 1 week as needed for flares.  Topical steroids (such as triamcinolone, fluocinolone, fluocinonide, mometasone, clobetasol, halobetasol, betamethasone, hydrocortisone) can cause thinning and lightening of the skin if they are used for too long in the same area. Your physician has selected the right strength medicine for your problem and area affected on the body. Please use your medication only as directed by your physician to prevent side effects.     Gentle Skin Care Guide  1. Bathe no more than once a day.  2. Avoid bathing in hot water  3. Use a mild soap like Dove, Vanicream, Cetaphil, CeraVe. Can use Lever 2000 or Cetaphil antibacterial soap  4. Use soap only where you need it. On most days, use it under your arms, between your legs, and on your feet. Let the water rinse other areas unless visibly dirty.  5. When you get out of the bath/shower, use a towel to gently blot your skin dry, don't rub it.  6. While your skin is still a little damp, apply a moisturizing cream such as Vanicream, CeraVe, Cetaphil, Eucerin, Sarna lotion or plain Vaseline Jelly. For hands apply Neutrogena [place] Hand Cream or Excipial Hand Cream.  7. Reapply moisturizer any time you start to itch or feel dry.  8. Sometimes using free and clear laundry detergents can be helpful. Fabric softener sheets should be avoided. Downy Free & Gentle liquid, or any liquid fabric softener that is free of dyes and perfumes, it acceptable to use  9. If your doctor has given you prescription creams you may apply moisturizers over them    Due to recent changes in healthcare laws, you may see results of your pathology and/or laboratory studies on MyChart before the doctors have had a chance to review them. We understand that in some cases there may be results that are confusing or concerning to you. Please understand that not all results are received at the same time and often the  doctors may need to interpret multiple results in order to provide you with the best plan of care or course of treatment. Therefore, we ask that you please give [place] 2 business days to thoroughly review all your results before contacting the office for clarification. Should we see a critical lab result, you will be contacted sooner.   If You Need Anything After Your Visit  If you have any questions or concerns for your doctor, please call our main line at [phone] and press option 4 to reach your doctor's medical assistant. If no one answers, please leave a voicemail as directed and we will return your call as soon as possible. Messages left after 4 pm will be answered the following business day.   You may also send [place] a message via [place]. We typically respond to MyChart messages within 1-2 business days.  For prescription refills, please ask your pharmacy to contact our office. Our fax number is [phone].  If you have an urgent issue when the clinic is closed that cannot wait until the next business day, you can page your doctor at the number below.    Please note that while we do our best to be available for urgent issues outside of office hours, we are not available 24/7.   If you have an urgent issue and are unable to reach [place], you may choose to seek medical care at your doctor's office, retail clinic, urgent care center, or emergency room.  If you have a  medical emergency, please immediately call 911 or go to the emergency department.  Pager Numbers  - Dr. Nehemiah Massed: [phone]  - Dr. Laurence Ferrari: [phone]  - Dr. Nicole Kindred: [phone]  In the event of inclement weather, please call our main line at [phone] for an update on the status of any delays or closures.  Dermatology Medication Tips: Please keep the boxes that topical medications come in in order to help keep track of the instructions about where and how to use these. Pharmacies typically print the medication  instructions only on the boxes and not directly on the medication tubes.   If your medication is too expensive, please contact our office at [phone] option 4 or send [place] a message through [place].   We are unable to tell what your co-pay for medications will be in advance as this is different depending on your insurance coverage. However, we may be able to find a substitute medication at lower cost or fill out paperwork to get insurance to cover a needed medication.   If a prior authorization is required to get your medication covered by your insurance company, please allow [place] 1-2 business days to complete this process.  Drug prices often vary depending on where the prescription is filled and some pharmacies may offer cheaper prices.  The website www.goodrx.com contains coupons for medications through different pharmacies. The prices here do not account for what the cost may be with help from insurance (it may be cheaper with your insurance), but the website can give you the price if you did not use any insurance.  - You can print the associated coupon and take it with your prescription to the pharmacy.  - You may also stop by our office during regular business hours and pick up a GoodRx coupon card.  - If you need your prescription sent electronically to a different pharmacy, notify our office through [facility] or by phone at [phone] option 4.     Si Usted Necesita Algo Despus de Su Visita  Tambin puede enviarnos un mensaje a travs de Pharmacist, community. Por lo general respondemos a los mensajes de MyChart en el transcurso de 1 a 2 das hbiles.  Para renovar recetas, por favor pida a su farmacia que se ponga en contacto con nuestra oficina. Harland Dingwall de fax es Guadalupe Guerra [phone].  Si tiene un asunto urgente cuando la clnica est cerrada y que no puede esperar hasta el siguiente da hbil, puede llamar/localizar a su doctor(a) al nmero que aparece a continuacin.   Por  favor, tenga en cuenta que aunque hacemos todo lo posible para estar disponibles para asuntos urgentes fuera del horario de [place], no estamos disponibles las 24 horas del da, los 7 das de la [place].   Si tiene un problema urgente y no puede comunicarse con nosotros, puede optar por buscar atencin mdica  en el consultorio de su doctor(a), en una clnica privada, en un centro de atencin urgente o en una sala de emergencias.  Si tiene Engineering geologist, por favor llame inmediatamente al 911 o vaya a la sala de emergencias.  Nmeros de bper  - Dr. Nehemiah Massed: [phone]  - Dra. Moye: [phone]  - Dra. Nicole Kindred: [phone]  En caso de inclemencias del [place], por favor llame a Johnsie Kindred principal al [phone] para una actualizacin sobre el [place] de cualquier retraso o cierre.  Consejos para la medicacin en dermatologa: Por favor, guarde las cajas en las que vienen los medicamentos de uso tpico para ayudarle  a seguir las [employer] dnde y cmo usarlos. Las farmacias generalmente imprimen las instrucciones del medicamento slo en las cajas y no directamente en los tubos del [place].   Si su medicamento es muy caro, por favor, pngase en contacto con Zigmund Daniel llamando al [phone] y presione la opcin 4 o envenos un mensaje a travs de Pharmacist, community.   No podemos decirle cul ser su copago por los medicamentos por adelantado ya que esto es diferente dependiendo de la cobertura de su seguro. Sin embargo, es posible que podamos encontrar un medicamento sustituto a Electrical engineer un formulario para que el seguro cubra el medicamento que se considera necesario.   Si se requiere una autorizacin previa para que su compaa de seguros [place] su medicamento, por favor permtanos de 1 a 2 das hbiles para completar este proceso.  Los precios de los medicamentos varan con frecuencia dependiendo del Environmental consultant de dnde se surte la receta y alguna farmacias  pueden ofrecer precios ms baratos.  El sitio web www.goodrx.com tiene cupones para medicamentos de Airline pilot. Los precios aqu no tienen en cuenta lo que podra costar con la ayuda del seguro (puede ser ms barato con su seguro), pero el sitio web puede darle el precio si no utiliz Research scientist (physical sciences).  - Puede imprimir el cupn correspondiente y llevarlo con su receta a la farmacia.  - Tambin puede pasar por nuestra oficina durante el horario de atencin regular y Charity fundraiser una tarjeta de cupones de GoodRx.  - Si necesita que su receta se enve electrnicamente a una farmacia diferente, informe a nuestra oficina a travs de MyChart de [place] o por telfono llamando al [phone] y presione la opcin 4.

## 2022-06-06 NOTE — Progress Notes (Signed)
Jonathon Bellows MD, MRCP(U.K) [address]  [place]  [place], [place] [address]  Main: [phone]  Fax: [phone]   Gastroenterology Consultation  Referring Provider:     [name], FNP Primary Care Physician:  [name], FNP Primary Gastroenterologist:  Dr. Jonathon Bellows  Reason for Consultation:   Internal hemorroids        HPI:   Elizabeth Berger is a 32 y.o. y/o female referred for consultation & management  by [name], FNP.    She states that she was in prolonged labor during recent delivery of her child for over 33 hours had a perineal tear unsure of the degree and had to have sutures following which she had.  Where she had difficulty passing urine and since then that has resolved but she does have incontinence when she coughs in terms of urine.  She has suffered from constipation since the delivery her diet seems to be not terribly rich in fiber but she can go for days without a bowel movement and when she does have 1 is preceded with abdominal discomfort but relieved after bowel movement and the stool the consistency of rocks.  Has tried some form of a tablet which has not worked but no other interventions.  At some point of time she had significant sharp pain in the perianal area associated with bowel movement which is healed with Anusol and does not have any rectal bleeding.  No family history of colon cancer or polyps.    Past Medical History:  Diagnosis Date   Anxiety    Depression    FH: migraines    mom   Herpes    1&2 IgG+   Migraines     Past Surgical History:  Procedure Laterality Date   TONSILLECTOMY      Prior to Admission medications   Medication Sig Start Date End Date Taking? Authorizing Provider  albuterol (VENTOLIN HFA) 108 (90 Base) MCG/ACT inhaler Inhale 2 puffs into the lungs every 6 (six) hours as needed for wheezing or shortness of breath. 04/26/22   [name], FNP  docusate sodium (COLACE) 100 MG capsule Take 1  capsule (100 mg total) by mouth 2 (two) times daily as needed. 07/02/21   Rubie Maid, MD  ferrous sulfate (FERROUSUL) 325 (65 FE) MG tablet Take 1 tablet (325 mg total) by mouth 2 (two) times daily. 07/02/21   Rubie Maid, MD  hydrocortisone (ANUSOL-HC) 25 MG suppository Place 1 suppository (25 mg total) rectally 2 (two) times daily. 11/07/21   Lurlean Horns, CNM  hydrOXYzine (ATARAX) 25 MG tablet Take 1 tablet (25 mg total) by mouth every 6 (six) hours as needed for anxiety (sleep). 07/02/21   Rubie Maid, MD  ibuprofen (ADVIL) 600 MG tablet Take 1 tablet (600 mg total) by mouth every 6 (six) hours. 07/02/21   Rubie Maid, MD  Meclizine HCl (ANTIVERT) 25 MG CHEW Chew 1 tablet by mouth 2 times daily as needed 10/24/21   Lucrezia Starch, MD  norethindrone (INCASSIA) 0.35 MG tablet Take 1 tablet (0.35 mg total) by mouth daily. 05/25/22   Lurlean Horns, CNM  omeprazole (PRILOSEC) 20 MG capsule Take 1 capsule (20 mg total) by mouth daily. 01/26/22   [name], FNP  sertraline (ZOLOFT) 50 MG tablet Take 1 tablet (50 mg total) by mouth daily. Take half a tab daily for the first week, then one tab daily 08/17/21   Lurlean Horns, CNM  Spacer/Aero-Holding Chambers [facility] ADVANTAGE-LG MASK) MISC use with albuterol 04/26/22   [name], FNP  valACYclovir (VALTREX) 1000 MG tablet Take 1 tablet (1,000 mg total) by mouth daily. 07/11/19   McLean-Scocuzza, Nino Glow, MD    Family History  Problem Relation Age of Onset   Arthritis Mother    Depression Mother    Cervical cancer Mother    Depression Maternal Grandmother    Cancer Maternal Grandmother 41       Breast   Cancer Maternal Grandfather        ? type ?bone? liver vs kidney   Hearing loss Maternal Grandfather      Social History   Tobacco Use   Smoking status: Former   Smokeless tobacco: Never   Tobacco comments:    social smoker   Vaping Use   Vaping Use: Never used  Substance Use Topics   Alcohol use: Not  Currently   Drug use: Not Currently    Allergies as of 06/06/2022   (No Known Allergies)    Review of Systems:    All systems reviewed and negative except where noted in HPI.   Physical Exam:  Ht '5\' 2"'$  (1.575 m)   Wt 141 lb 6.4 oz (64.1 kg)   BMI 25.86 kg/m  No LMP recorded. Psych:  Alert and cooperative. Normal mood and affect. General:   Alert,  Well-developed, well-nourished, pleasant and cooperative in NAD Head:  Normocephalic and atraumatic. Eyes:  Sclera clear, no icterus.   Conjunctiva pink. Ears:  Normal auditory acuity.  Neurologic:  Alert and oriented x3;  grossly normal neurologically. Psych:  Alert and cooperative. Normal mood and affect.  Imaging Studies: No results found.  Assessment and Plan:   Elizabeth Berger is a 32 y.o. y/o female has been referred for hemorrhoids.  Presently her history is not suggestive of any issues with hemorrhoids.  Her history is suggestive of an anal fissure that has healed and is not causing any symptoms presently likely due to chronic constipation.  She may have chronic constipation related to change in the anorectal angle from prolonged labor during childbirth when she was in labor for over 33 hours and had a perineal tear.  I have discussed with her the options and at the first therapy will increase the fiber in her diet to 25  g of fiber per day, commence on MiraLAX 1-2 capfuls a day she will send me a message in a week's time to tell me how she is doing if no better may need to consider colonoscopy, medication such as Linzess.  Down the road if required she may require ano rectal manometry.  TSH was normal in 01/18/2022  Follow up in as needed  Dr Jonathon Bellows MD,MRCP(U.K)

## 2022-06-07 ENCOUNTER — Encounter: Payer: Self-pay | Admitting: Dermatology

## 2022-06-07 ENCOUNTER — Other Ambulatory Visit: Payer: Self-pay

## 2022-06-07 NOTE — Telephone Encounter (Signed)
I agree its usually surgery but we can have her see a surgeon to discuss further if she wishes .

## 2022-06-11 ENCOUNTER — Emergency Department: Payer: 59

## 2022-06-11 ENCOUNTER — Other Ambulatory Visit: Payer: Self-pay

## 2022-06-11 ENCOUNTER — Emergency Department
Admission: EM | Admit: 2022-06-11 | Discharge: 2022-06-11 | Disposition: A | Discharge status: Home / Self Care | Payer: 59 | Attending: Emergency Medicine | Admitting: Emergency Medicine

## 2022-06-11 DIAGNOSIS — Z1152 Encounter for screening for COVID-19: Secondary | ICD-10-CM | POA: Diagnosis not present

## 2022-06-11 DIAGNOSIS — J101 Influenza due to other identified influenza virus with other respiratory manifestations: Secondary | ICD-10-CM | POA: Insufficient documentation

## 2022-06-11 DIAGNOSIS — Z8616 Personal history of COVID-19: Secondary | ICD-10-CM | POA: Insufficient documentation

## 2022-06-11 DIAGNOSIS — R000 Tachycardia, unspecified: Secondary | ICD-10-CM | POA: Diagnosis not present

## 2022-06-11 DIAGNOSIS — R059 Cough, unspecified: Secondary | ICD-10-CM | POA: Diagnosis not present

## 2022-06-11 LAB — RESP PANEL BY RT-PCR (RSV, FLU A&B, COVID)  RVPGX2
Influenza A by PCR: POSITIVE — AB
Influenza B by PCR: NEGATIVE
Resp Syncytial Virus by PCR: NEGATIVE
SARS Coronavirus 2 by RT PCR: NEGATIVE

## 2022-06-11 MED ORDER — ONDANSETRON 4 MG PO TBDP
4.0000 mg | ORAL_TABLET | Freq: Three times a day (TID) | ORAL | 0 refills | Status: DC | PRN
  Filled 2022-06-11: qty 20, 7d supply, fill #0

## 2022-06-11 MED ORDER — KETOROLAC TROMETHAMINE 15 MG/ML IJ SOLN
15.0000 mg | Freq: Once | INTRAMUSCULAR | Status: AC
  Administered 2022-06-11: 15 mg via INTRAVENOUS
  Filled 2022-06-11: qty 1

## 2022-06-11 MED ORDER — PANTOPRAZOLE SODIUM 40 MG IV SOLR
40.0000 mg | Freq: Once | INTRAVENOUS | Status: AC
  Administered 2022-06-11: 40 mg via INTRAVENOUS
  Filled 2022-06-11: qty 10

## 2022-06-11 MED ORDER — SODIUM CHLORIDE 0.9 % IV BOLUS
1000.0000 mL | Freq: Once | INTRAVENOUS | Status: AC
  Administered 2022-06-11: 1000 mL via INTRAVENOUS

## 2022-06-11 MED ORDER — ONDANSETRON HCL 4 MG/2ML IJ SOLN
4.0000 mg | Freq: Once | INTRAMUSCULAR | Status: AC
  Administered 2022-06-11: 4 mg via INTRAVENOUS
  Filled 2022-06-11: qty 2

## 2022-06-11 NOTE — ED Notes (Signed)
Pt states coming in due to having body aches, nausea, vomiting and some diarrhea as well as a cough. Pt states being diagnosed with covid 2 weeks ago. Pt taken to x-ray at this time

## 2022-06-11 NOTE — ED Triage Notes (Signed)
Pt presents to ED with c/o of cough and flu like s/s since yesterday. NAD noted. Pt ambulatory to triage.

## 2022-06-11 NOTE — ED Provider Notes (Signed)
[facility] Provider Note    Event Date/Time   First MD Initiated Contact with Patient 06/11/22 0818     (approximate)   History   Chief Complaint: Cough   HPI  Elizabeth Berger is a 32 y.o. female with no significant past medical history who comes to the ED complain of bodyaches, fevers chills, nausea vomiting since yesterday.  Not able to eat or drink anything in the last 24 hours.  Also reports some diarrhea but not copious.  No chest pain shortness of breath headache or neck stiffness.  Has occasional nonproductive cough as well.     Physical Exam   Triage Vital Signs: ED Triage Vitals  Enc Vitals Group     BP 06/11/22 0813 112/84     Pulse Rate 06/11/22 0813 (!) 130     Resp 06/11/22 0813 18     Temp 06/11/22 0813 99.2 F (37.3 C)     Temp Source 06/11/22 0813 Oral     SpO2 06/11/22 0813 96 %     Weight --      Height --      Head Circumference --      Peak Flow --      Pain Score 06/11/22 0812 0     Pain Loc --      Pain Edu? --      Excl. in [place]? --     Most recent vital signs: Vitals:   06/11/22 0930 06/11/22 0937  BP: 103/69   Pulse: 97 (!) 106  Resp: 16   Temp:    SpO2: 95% 96%    General: Awake, no distress.  CV:  Good peripheral perfusion.  Tachycardia heart rate 115 Resp:  Normal effort.  Clear to auscultation bilaterally.  No wheezes or crackles Abd:  No distention.  Soft nontender Other:  Mucous membranes   ED Results / Procedures / Treatments   Labs (all labs ordered are listed, but only abnormal results are displayed) Labs Reviewed  RESP PANEL BY RT-PCR (RSV, FLU A&B, COVID)  RVPGX2 - Abnormal; Notable for the following components:      Result Value   Influenza A by PCR POSITIVE (*)    All other components within normal limits     EKG Interpreted by me Sinus tachycardia rate 118.  Normal axis, normal intervals.  Normal QRS ST segments and T waves.   RADIOLOGY Chest x-ray interpreted by me, peers  normal.  Clear lungs, no pleural effusion or pulmonary edema.  Normal mediastinal silhouette.  Radiology report reviewed   PROCEDURES:  Procedures   MEDICATIONS ORDERED IN ED: Medications  sodium chloride 0.9 % bolus 1,000 mL (1,000 mLs Intravenous New Bag/Given 06/11/22 0847)  ondansetron (ZOFRAN) injection 4 mg (4 mg Intravenous Given 06/11/22 0848)  ketorolac (TORADOL) 15 MG/ML injection 15 mg (15 mg Intravenous Given 06/11/22 0850)  pantoprazole (PROTONIX) injection 40 mg (40 mg Intravenous Given 06/11/22 0851)     IMPRESSION / MDM / ASSESSMENT AND PLAN / ED COURSE  I reviewed the triage vital signs and the nursing notes.  DDx: Influenza-like illness, dehydration, pneumonia, pleural effusion  Patient's presentation is most consistent with acute presentation with potential threat to life or bodily function.  Patient presents with multiple symptoms consistent with a viral syndrome/influenza-like illness.  She is tachycardic and appears dehydrated.  She is not in distress and nontoxic.  Abdomen is benign.  Lungs are clear.  Not septic.  Will give IV fluids, Zofran and  Toradol Protonix for hydration and symptom relief.  ----------------------------------------- 10:08 AM on 06/11/2022 ----------------------------------------- Heart rate normalized, now 90.  Feels much better.  Tolerating oral intake.  Swab positive for influenza A.  Stable for discharge       FINAL CLINICAL IMPRESSION(S) / ED DIAGNOSES   Final diagnoses:  Influenza A     Rx / DC Orders   ED Discharge Orders          Ordered    ondansetron (ZOFRAN-ODT) 4 MG disintegrating tablet  Every 8 hours PRN        06/11/22 1008             Note:  This document was prepared using Dragon voice recognition software and may include unintentional dictation errors.   Carrie Mew, MD 06/11/22 1008

## 2022-06-11 NOTE — ED Notes (Signed)
Pt denies vomiting since PO challenge

## 2022-06-11 NOTE — ED Notes (Signed)
Pt given water and crackers for PO challenge

## 2022-06-12 ENCOUNTER — Other Ambulatory Visit: Payer: Self-pay

## 2022-06-19 ENCOUNTER — Other Ambulatory Visit: Payer: Self-pay

## 2022-06-20 ENCOUNTER — Encounter: Payer: Self-pay | Admitting: Gastroenterology

## 2022-06-20 NOTE — Telephone Encounter (Signed)
Yes MiraLAX should be used on a daily basis works best when taken regularly rather than on an on and off basis

## 2022-06-21 ENCOUNTER — Telehealth: Payer: 59 | Admitting: Obstetrics

## 2022-07-14 ENCOUNTER — Telehealth: Payer: Self-pay | Admitting: Family Medicine

## 2022-07-27 ENCOUNTER — Encounter: Payer: No Typology Code available for payment source | Admitting: Family Medicine

## 2022-07-28 NOTE — Progress Notes (Unsigned)
I,[name],acting as a Education administrator for Elizabeth Sprout, FNP.,have documented all relevant documentation on the behalf of Elizabeth Sprout, FNP,as directed by  Elizabeth Sprout, FNP while in the presence of Elizabeth Sprout, FNP.   Complete physical exam  Patient: Elizabeth Berger   DOB: 23-Oct-1990   32 y.o. Female  MRN: [id number] Visit Date: 07/31/2022  Today's healthcare provider: [name], FNP  Re Introduced to nurse practitioner role and practice setting.  All questions answered.  Discussed provider/patient relationship and expectations.  Subjective    Elizabeth Berger is a 32 y.o. female who presents today for a complete physical exam.  She reports consuming a general diet. The patient does not participate in regular exercise at present. She generally feels well. She reports sleeping fairly well. She does not have additional problems to discuss today.  HPI    Past Medical History:  Diagnosis Date   Anxiety    Depression    FH: migraines    mom   Herpes    1&2 IgG+   Migraines    Past Surgical History:  Procedure Laterality Date   TONSILLECTOMY     Social History   Socioeconomic History   Marital status: Single    Spouse name: Devon   Number of children: Not on file   Years of education: Not on file   Highest education level: Not on file  Occupational History   Not on file  Tobacco Use   Smoking status: Former   Smokeless tobacco: Never   Tobacco comments:    social smoker   Vaping Use   Vaping Use: Never used  Substance and Sexual Activity   Alcohol use: Not Currently   Drug use: Not Currently   Sexual activity: Not Currently    Partners: Male    Comment: undecided  Other Topics Concern   Not on file  Social History Narrative       [employer] [place]    HS ed    No kids    Wears seat belt, no guns, safe in relationship    Social Determinants of Health   Financial Resource Strain: Not on file  Food Insecurity:  Not on file  Transportation Needs: Not on file  Physical Activity: Not on file  Stress: Not on file  Social Connections: Not on file  Intimate Partner Violence: Not on file   Family Status  Relation Name Status   Mother  Alive   Father  (Not Specified)       unknown    Brother  Alive   MGM  Alive   MGF  Deceased   Family History  Problem Relation Age of Onset   Arthritis Mother    Depression Mother    Cervical cancer Mother    Depression Maternal Grandmother    Cancer Maternal Grandmother 60       Breast   Cancer Maternal Grandfather        ? type ?bone? liver vs kidney   Hearing loss Maternal Grandfather    No Known Allergies  Patient Care Team: Elizabeth Sprout, FNP as PCP - General (Family Medicine)   Medications: Outpatient Medications Prior to Visit  Medication Sig   docusate sodium (COLACE) 100 MG capsule Take 1 capsule (100 mg total) by mouth 2 (two) times daily as needed. (Patient not taking: Reported on 06/06/2022)   hydrOXYzine (ATARAX) 25 MG tablet Take 1 tablet (25 mg total) by mouth  every 6 (six) hours as needed for anxiety (sleep). (Patient not taking: Reported on 06/06/2022)   ibuprofen (ADVIL) 600 MG tablet Take 1 tablet (600 mg total) by mouth every 6 (six) hours. (Patient not taking: Reported on 06/06/2022)   Meclizine HCl (ANTIVERT) 25 MG CHEW Chew 1 tablet by mouth 2 times daily as needed (Patient not taking: Reported on 06/06/2022)   mometasone (ELOCON) 0.1 % cream Apply twice daily to affected areas up to 1 week as needed for rash   norethindrone (INCASSIA) 0.35 MG tablet Take 1 tablet (0.35 mg total) by mouth daily.   omeprazole (PRILOSEC) 20 MG capsule Take 1 capsule (20 mg total) by mouth daily.   ondansetron (ZOFRAN-ODT) 4 MG disintegrating tablet Take 1 tablet (4 mg total) by mouth every 8 (eight) hours as needed for nausea or vomiting.   Spacer/Aero-Holding [facility] ADVANTAGE-LG MASK) MISC use with albuterol (Patient not taking: Reported on  06/06/2022)   valACYclovir (VALTREX) 1000 MG tablet Take 1 tablet (1,000 mg total) by mouth daily.   No facility-administered medications prior to visit.    Review of Systems  Genitourinary:  Positive for urgency.    Objective    BP 106/70 (BP Location: Left Arm)   Pulse 78   LMP 07/30/2022   SpO2 100%   Breastfeeding No   Physical Exam Vitals and nursing note reviewed.  Constitutional:      General: She is awake. She is not in acute distress.    Appearance: Normal appearance. She is well-developed, well-groomed and normal weight. She is not ill-appearing, toxic-appearing or diaphoretic.  HENT:     Head: Normocephalic and atraumatic.     Jaw: There is normal jaw occlusion. No trismus, tenderness, swelling or pain on movement.     Right Ear: Hearing, tympanic membrane, ear canal and external ear normal. There is no impacted cerumen.     Left Ear: Hearing, tympanic membrane, ear canal and external ear normal. There is no impacted cerumen.     Nose: Nose normal. No congestion or rhinorrhea.     Right Turbinates: Not enlarged, swollen or pale.     Left Turbinates: Not enlarged, swollen or pale.     Right Sinus: No maxillary sinus tenderness or frontal sinus tenderness.     Left Sinus: No maxillary sinus tenderness or frontal sinus tenderness.     Mouth/Throat:     Lips: Pink.     Mouth: Mucous membranes are moist. No injury.     Tongue: No lesions.     Pharynx: Oropharynx is clear. Uvula midline. No pharyngeal swelling, oropharyngeal exudate, posterior oropharyngeal erythema or uvula swelling.     Tonsils: No tonsillar exudate or tonsillar abscesses.  Eyes:     General: Lids are normal. Lids are everted, no foreign bodies appreciated. Vision grossly intact. Gaze aligned appropriately. No allergic shiner or visual field deficit.       Right eye: No discharge.        Left eye: No discharge.     Extraocular Movements: Extraocular movements intact.     Conjunctiva/sclera:  Conjunctivae normal.     Right eye: Right conjunctiva is not injected. No exudate.    Left eye: Left conjunctiva is not injected. No exudate.    Pupils: Pupils are equal, round, and reactive to light.  Neck:     Thyroid: No thyroid mass, thyromegaly or thyroid tenderness.     Vascular: No carotid bruit.     Trachea: Trachea normal.  Cardiovascular:  Rate and Rhythm: Normal rate and regular rhythm.     Pulses: Normal pulses.          Carotid pulses are 2+ on the right side and 2+ on the left side.      Radial pulses are 2+ on the right side and 2+ on the left side.       Dorsalis pedis pulses are 2+ on the right side and 2+ on the left side.       Posterior tibial pulses are 2+ on the right side and 2+ on the left side.     Heart sounds: Normal heart sounds, S1 normal and S2 normal. No murmur heard.    No friction rub. No gallop.  Pulmonary:     Effort: Pulmonary effort is normal. No respiratory distress.     Breath sounds: Normal breath sounds and air entry. No stridor. No wheezing, rhonchi or rales.  Chest:     Chest wall: No tenderness.  Abdominal:     General: Abdomen is flat. Bowel sounds are normal. There is no distension.     Palpations: Abdomen is soft. There is no mass.     Tenderness: There is no abdominal tenderness. There is no right CVA tenderness, left CVA tenderness, guarding or rebound.     Hernia: No hernia is present.  Genitourinary:    Comments: Exam deferred; denies complaints Musculoskeletal:        General: No swelling, tenderness, deformity or signs of injury. Normal range of motion.     Cervical back: Full passive range of motion without pain, normal range of motion and neck supple. No edema, rigidity or tenderness. No muscular tenderness.     Right lower leg: No edema.     Left lower leg: No edema.  Lymphadenopathy:     Cervical: No cervical adenopathy.     Right cervical: No superficial, deep or posterior cervical adenopathy.    Left cervical: No  superficial, deep or posterior cervical adenopathy.  Skin:    General: Skin is warm and dry.     Capillary Refill: Capillary refill takes less than 2 seconds.     Coloration: Skin is not jaundiced or pale.     Findings: No bruising, erythema, lesion or rash.  Neurological:     General: No focal deficit present.     Mental Status: She is alert and oriented to person, place, and time. Mental status is at baseline.     GCS: GCS eye subscore is 4. GCS verbal subscore is 5. GCS motor subscore is 6.     Sensory: Sensation is intact. No sensory deficit.     Motor: Motor function is intact. No weakness.     Coordination: Coordination is intact. Coordination normal.     Gait: Gait is intact. Gait normal.  Psychiatric:        Attention and Perception: Attention and perception normal.        Mood and Affect: Mood and affect normal.        Speech: Speech normal.        Behavior: Behavior normal. Behavior is cooperative.        Thought Content: Thought content normal.        Cognition and Memory: Cognition and memory normal.        Judgment: Judgment normal.     Last depression screening scores    07/31/2022    3:47 PM 01/26/2022    2:25 PM 11/29/2020    3:20 PM  PHQ 2/9 Scores  PHQ - 2 Score 0 1 0  PHQ- 9 Score 4 8    Last fall risk screening    07/31/2022    3:47 PM  [place] in the past year? 0  Number falls in past yr: 0  Injury with Fall? 0  Risk for fall due to : No Fall Risks  Follow up Falls evaluation completed   Last Audit-C alcohol use screening    07/31/2022    3:47 PM  Alcohol Use Disorder Test (AUDIT)  1. How often do you have a drink containing alcohol? 2  2. How many drinks containing alcohol do you have on a typical day when you are drinking? 1  3. How often do you have six or more drinks on one occasion? 0  AUDIT-C Score 3   A score of 3 or more in women, and 4 or more in men indicates increased risk for alcohol abuse, EXCEPT if all of the points are from  question 1   Results for orders placed or performed in visit on 07/31/22  POCT Urinalysis Dipstick  Result Value Ref Range   Color, UA     Clarity, UA     Glucose, UA Negative Negative   Bilirubin, UA negative    Ketones, UA negative    Spec Grav, UA 1.025 1.010 - 1.025   Blood, UA negative    pH, UA 6.0 5.0 - 8.0   Protein, UA Negative Negative   Urobilinogen, UA 0.2 0.2 or 1.0 E.U./dL   Nitrite, UA negative    Leukocytes, UA Negative Negative   Appearance     Odor      Assessment & Plan    Routine Health Maintenance and Physical Exam  Exercise Activities and Dietary recommendations  Goals   None     Immunization History  Administered Date(s) Administered   Influenza,inj,Quad PF,6+ Mos 01/11/2021, 01/26/2022   Influenza-Unspecified 02/26/2020   PFIZER(Purple Top)SARS-COV-2 Vaccination 01/30/2020, 03/01/2020   Tdap 11/06/2017, 04/04/2021    Health Maintenance  Topic Date Due   COVID-19 Vaccine (3 - Pfizer risk series) 03/29/2020   INFLUENZA VACCINE  11/30/2022   PAP-Cervical Cytology Screening  12/14/2023   PAP SMEAR-Modifier  12/14/2023   DTaP/Tdap/Td (3 - Td or Tdap) 04/05/2031   Hepatitis C Screening  Completed   HIV Screening  Completed   HPV VACCINES  Aged Out    Discussed health benefits of physical activity, and encouraged her to engage in regular exercise appropriate for her age and condition.  Problem List Items Addressed This Visit       Other   Annual physical exam - Primary    Encourage dental and vision screenings; patient notes that she has not seen a dentist in years Things to do to keep yourself healthy  - Exercise at least 30-45 minutes a day, 3-4 days a week.  - Eat a low-fat diet with lots of fruits and vegetables, up to 7-9 servings per day.  - Seatbelts can save your life. Wear them always.  - Smoke detectors on every level of your home, check batteries every year.  - Eye Doctor - have an eye exam every 1-2 years  - Safe sex - if  you may be exposed to STDs, use a condom.  - Alcohol -  If you drink, do it moderately, less than 2 drinks per day.  - [place]. Choose someone to speak for you if you are  not able.  - Depression is common in our stressful world.If you're feeling down or losing interest in things you normally enjoy, please come in for a visit.  - Violence - If anyone is threatening or hurting you, please call immediately.       Relevant Orders   CBC with Differential/Platelet   Comprehensive Metabolic Panel (CMET)   Vitamin D (25 hydroxy)   TSH + free T4   Lipid panel   Avitaminosis D    Previously low; recommend Vit D repeat labs to assist with further use of supplementation       Relevant Orders   Vitamin D (25 hydroxy)   Urgency of urination    UA normal; continue to monitor s/s      Relevant Orders   POCT Urinalysis Dipstick (Completed)   Return in about 1 year (around 07/31/2023) for annual examination.    Vonna Kotyk, FNP, have reviewed all documentation for this visit. The documentation on 07/31/22 for the exam, diagnosis, procedures, and orders are all accurate and complete.  Elizabeth Berger, [place] [phone] (phone) [phone] (fax)  [place]

## 2022-07-31 ENCOUNTER — Encounter: Payer: Self-pay | Admitting: Family Medicine

## 2022-07-31 ENCOUNTER — Ambulatory Visit (INDEPENDENT_AMBULATORY_CARE_PROVIDER_SITE_OTHER): Payer: 59 | Admitting: Family Medicine

## 2022-07-31 VITALS — BP 106/70 | HR 78

## 2022-07-31 DIAGNOSIS — Z0000 Encounter for general adult medical examination without abnormal findings: Secondary | ICD-10-CM | POA: Diagnosis not present

## 2022-07-31 DIAGNOSIS — R3915 Urgency of urination: Secondary | ICD-10-CM | POA: Diagnosis not present

## 2022-07-31 DIAGNOSIS — E559 Vitamin D deficiency, unspecified: Secondary | ICD-10-CM | POA: Diagnosis not present

## 2022-07-31 LAB — POCT URINALYSIS DIPSTICK
Bilirubin, UA: NEGATIVE
Blood, UA: NEGATIVE
Glucose, UA: NEGATIVE
Ketones, UA: NEGATIVE
Leukocytes, UA: NEGATIVE
Nitrite, UA: NEGATIVE
Protein, UA: NEGATIVE
Spec Grav, UA: 1.025 (ref 1.010–1.025)
Urobilinogen, UA: 0.2 E.U./dL
pH, UA: 6 (ref 5.0–8.0)

## 2022-07-31 NOTE — Assessment & Plan Note (Signed)
Encourage dental and vision screenings; patient notes that she has not seen a dentist in years Things to do to keep yourself healthy  - Exercise at least 30-45 minutes a day, 3-4 days a week.  - Eat a low-fat diet with lots of fruits and vegetables, up to 7-9 servings per day.  - Seatbelts can save your life. Wear them always.  - Smoke detectors on every level of your home, check batteries every year.  - Eye Doctor - have an eye exam every 1-2 years  - Safe sex - if you may be exposed to STDs, use a condom.  - Alcohol -  If you drink, do it moderately, less than 2 drinks per day.  - [place]. Choose someone to speak for you if you are not able.  - Depression is common in our stressful world.If you're feeling down or losing interest in things you normally enjoy, please come in for a visit.  - Violence - If anyone is threatening or hurting you, please call immediately.

## 2022-07-31 NOTE — Assessment & Plan Note (Signed)
UA normal; continue to monitor s/s

## 2022-07-31 NOTE — Assessment & Plan Note (Signed)
Previously low; recommend Vit D repeat labs to assist with further use of supplementation

## 2022-08-07 ENCOUNTER — Other Ambulatory Visit: Payer: Self-pay

## 2022-08-07 ENCOUNTER — Ambulatory Visit
Admission: EM | Admit: 2022-08-07 | Discharge: 2022-08-07 | Disposition: A | Discharge status: Home / Self Care | Payer: 59 | Attending: Urgent Care | Admitting: Urgent Care

## 2022-08-07 DIAGNOSIS — B9689 Other specified bacterial agents as the cause of diseases classified elsewhere: Secondary | ICD-10-CM

## 2022-08-07 DIAGNOSIS — R6889 Other general symptoms and signs: Secondary | ICD-10-CM | POA: Diagnosis not present

## 2022-08-07 DIAGNOSIS — J028 Acute pharyngitis due to other specified organisms: Secondary | ICD-10-CM | POA: Diagnosis not present

## 2022-08-07 LAB — POCT RAPID STREP A (OFFICE): Rapid Strep A Screen: POSITIVE — AB

## 2022-08-07 MED ORDER — AMOXICILLIN 500 MG PO CAPS
500.0000 mg | ORAL_CAPSULE | Freq: Two times a day (BID) | ORAL | 0 refills | Status: AC
  Filled 2022-08-07: qty 20, 10d supply, fill #0

## 2022-08-07 NOTE — Discharge Instructions (Addendum)
Follow up here or with your primary care provider if your symptoms are worsening or not improving with treatment.     

## 2022-08-07 NOTE — ED Triage Notes (Signed)
Patient presents to [facility] for sore throat, chills, nausea, ear pain, and body aches since Saturday. Last known fever on Saturday. Taking tylenol.

## 2022-08-07 NOTE — ED Notes (Signed)
Offered zofran for nausea, patient declined. States she has some at home.

## 2022-08-07 NOTE — ED Provider Notes (Signed)
[facility]    CSN: [id number] Arrival date & time: 08/07/22  0831      History   Chief Complaint Chief Complaint  Patient presents with   Sore Throat   Otalgia   Chills   Generalized Body Aches    HPI Elizabeth Berger is a 32 y.o. female.    Sore Throat  Otalgia   Presents to urgent care with complaint of sore throat, fever, chills, nausea, ear pain, body aches x 2 days.  Also reports mild cough and nasal congestion when she "lays down".  Past Medical History:  Diagnosis Date   Anxiety    Depression    FH: migraines    mom   Herpes    1&2 IgG+   Migraines     Patient Active Problem List   Diagnosis Date Noted   Urgency of urination 07/31/2022   Avitaminosis D 07/31/2022   Viral URI 04/26/2022   Atypical nevi 01/26/2022   Need for influenza vaccination 01/26/2022   Internal hemorrhoids 01/26/2022   Missed period 01/26/2022   Elevated LDL cholesterol level 01/26/2022   Tremor of both hands 01/26/2022   Chronic fatigue 01/26/2022   Elevated serum glucose 01/26/2022   Screening for thyroid disorder 01/26/2022   Post-dates pregnancy 07/02/2021   Generalized anxiety disorder 07/01/2021   Labor and delivery, indication for care 06/03/2021   COVID-19 affecting pregnancy in second trimester 03/28/2021   Back pain affecting pregnancy in second trimester 03/28/2021   Kidney stones    Herpes 09/01/2019   Annual physical exam 04/18/2019   History of kidney stones 04/18/2019   Gastroesophageal reflux disease 04/18/2019   Migraine without status migrainosus, not intractable 09/25/2017   Family history of breast cancer 09/25/2017   Family history of ovarian cancer 09/25/2017    Past Surgical History:  Procedure Laterality Date   TONSILLECTOMY      OB History     Gravida  1   Para  1   Term  1   Preterm  0   AB  0   Living  1      SAB  0   IAB  0   Ectopic  0   Multiple  0   Live Births  1            Home  Medications    Prior to Admission medications   Medication Sig Start Date End Date Taking? Authorizing Provider  docusate sodium (COLACE) 100 MG capsule Take 1 capsule (100 mg total) by mouth 2 (two) times daily as needed. Patient not taking: Reported on 06/06/2022 07/02/21   Hildred Laser, MD  hydrOXYzine (ATARAX) 25 MG tablet Take 1 tablet (25 mg total) by mouth every 6 (six) hours as needed for anxiety (sleep). Patient not taking: Reported on 06/06/2022 07/02/21   Hildred Laser, MD  ibuprofen (ADVIL) 600 MG tablet Take 1 tablet (600 mg total) by mouth every 6 (six) hours. Patient not taking: Reported on 06/06/2022 07/02/21   Hildred Laser, MD  Meclizine HCl (ANTIVERT) 25 MG CHEW Chew 1 tablet by mouth 2 times daily as needed Patient not taking: Reported on 06/06/2022 10/24/21   Gilles Chiquito, MD  mometasone (ELOCON) 0.1 % cream Apply twice daily to affected areas up to 1 week as needed for rash 06/06/22   Deirdre Evener, MD  norethindrone (INCASSIA) 0.35 MG tablet Take 1 tablet (0.35 mg total) by mouth daily. 05/25/22   Glenetta Borg, CNM  omeprazole (PRILOSEC)  20 MG capsule Take 1 capsule (20 mg total) by mouth daily. 01/26/22   Jacky KindlePayne, Elise T, FNP  ondansetron (ZOFRAN-ODT) 4 MG disintegrating tablet Take 1 tablet (4 mg total) by mouth every 8 (eight) hours as needed for nausea or vomiting. 06/11/22   Sharman CheekStafford, Phillip, MD  Spacer/Aero-Holding [facility](OPTICHAMBER ADVANTAGE-LG MASK) MISC use with albuterol Patient not taking: Reported on 06/06/2022 04/26/22   Jacky KindlePayne, Elise T, FNP  valACYclovir (VALTREX) 1000 MG tablet Take 1 tablet (1,000 mg total) by mouth daily. 07/11/19   McLean-Scocuzza, Pasty Spillersracy N, MD    Family History Family History  Problem Relation Age of Onset   Arthritis Mother    Depression Mother    Cervical cancer Mother    Depression Maternal Grandmother    Cancer Maternal Grandmother [id number]       Breast   Cancer Maternal Grandfather        ? type ?bone? liver vs kidney   Hearing loss  Maternal Grandfather     Social History Social History   Tobacco Use   Smoking status: Former   Smokeless tobacco: Never   Tobacco comments:    social smoker   Vaping Use   Vaping Use: Never used  Substance Use Topics   Alcohol use: Not Currently   Drug use: Not Currently     Allergies   Patient has no known allergies.   Review of Systems Review of Systems  HENT:  Positive for ear pain.      Physical Exam Triage Vital Signs ED Triage Vitals  Enc Vitals Group     BP 08/07/22 0902 121/78     Pulse Rate 08/07/22 0902 (!) 112     Resp 08/07/22 0902 18     Temp 08/07/22 0902 97.8 F (36.6 C)     Temp Source 08/07/22 0902 Temporal     SpO2 08/07/22 0902 98 %     Weight --      Height --      Head Circumference --      Peak Flow --      Pain Score 08/07/22 0904 0     Pain Loc --      Pain Edu? --      Excl. in GC? --    No data found.  Updated Vital Signs BP 121/78 (BP Location: Left Arm)   Pulse (!) 112   Temp 97.8 F (36.6 C) (Temporal)   Resp 18   LMP 07/30/2022   SpO2 98%   Breastfeeding No   Visual Acuity Right Eye Distance:   Left Eye Distance:   Bilateral Distance:    Right Eye Near:   Left Eye Near:    Bilateral Near:     Physical Exam Vitals reviewed.  Constitutional:      Appearance: She is well-developed.  HENT:     Mouth/Throat:     Mouth: Mucous membranes are moist.     Pharynx: Posterior oropharyngeal erythema present. No oropharyngeal exudate.     Tonsils: No tonsillar exudate. 0 on the right. 0 on the left.  Cardiovascular:     Rate and Rhythm: Normal rate and regular rhythm.     Heart sounds: Normal heart sounds.  Pulmonary:     Effort: Pulmonary effort is normal.     Breath sounds: Normal breath sounds.  Skin:    General: Skin is warm and dry.  Neurological:     General: No focal deficit present.     Mental Status: She is  alert and oriented to person, place, and time.  Psychiatric:        Mood and Affect: Mood  normal.        Behavior: Behavior normal.      UC Treatments / Results  Labs (all labs ordered are listed, but only abnormal results are displayed) Labs Reviewed - No data to display  EKG   Radiology No results found.  Procedures Procedures (including critical care time)  Medications Ordered in UC Medications - No data to display  Initial Impression / Assessment and Plan / UC Course  I have reviewed the triage vital signs and the nursing notes.  Pertinent labs & imaging results that were available during my care of the patient were reviewed by me and considered in my medical decision making (see chart for details).   Patient is afebrile here without recent antipyretics. Satting well on room air. Overall is well appearing, well hydrated, without respiratory distress. Pulmonary exam is unremarkable.  Lungs CTAB without wheezing, rhonchi, rales.  Mild pharyngeal erythema.  No peritonsillar exudates.  Tonsils are 0 bilaterally.  TMs are nonerythematous bilaterally.  TM tube is present in the right ear (placed in childhood).  Rapid strep is positive. Treating with antibacterial therapy. Recommending use of OTC medication for symptom control.  Counseled patient on potential for adverse effects with medications prescribed/recommended today, ER and return-to-clinic precautions discussed, patient verbalized understanding and agreement with care plan.    Final Clinical Impressions(s) / UC Diagnoses   Final diagnoses:  None   Discharge Instructions   None    ED Prescriptions   None    PDMP not reviewed this encounter.   Charma Igo, [place] 08/07/22 [phone]

## 2022-08-24 ENCOUNTER — Encounter: Payer: Self-pay | Admitting: Family Medicine

## 2022-09-02 ENCOUNTER — Telehealth: Payer: 59 | Admitting: Physician Assistant

## 2022-09-02 ENCOUNTER — Telehealth: Payer: 59

## 2022-09-02 DIAGNOSIS — J029 Acute pharyngitis, unspecified: Secondary | ICD-10-CM

## 2022-09-02 MED ORDER — AMOXICILLIN-POT CLAVULANATE 875-125 MG PO TABS: 1.0000 | ORAL_TABLET | Freq: Two times a day (BID) | ORAL | 0 refills | Status: DC

## 2022-09-02 NOTE — Patient Instructions (Signed)
Elizabeth Berger, thank you for joining Tylene Fantasia Ward, PA-C for today's virtual visit.  While this provider is not your primary care provider (PCP), if your PCP is located in our provider database this encounter information will be shared with them immediately following your visit.   A [facility] MyChart account gives you access to today's visit and all your visits, tests, and labs performed at [facility] " click here if you don't have a [facility] MyChart account or go to [email or web address]  Consent: (Patient) Elizabeth Berger provided verbal consent for this virtual visit at the beginning of the encounter.  Current Medications:  Current Outpatient Medications:    amoxicillin-clavulanate (AUGMENTIN) 875-125 MG tablet, Take 1 tablet by mouth 2 (two) times daily., Disp: 20 tablet, Rfl: 0   docusate sodium (COLACE) 100 MG capsule, Take 1 capsule (100 mg total) by mouth 2 (two) times daily as needed. (Patient not taking: Reported on 06/06/2022), Disp: 30 capsule, Rfl: 2   hydrOXYzine (ATARAX) 25 MG tablet, Take 1 tablet (25 mg total) by mouth every 6 (six) hours as needed for anxiety (sleep). (Patient not taking: Reported on 06/06/2022), Disp: 30 tablet, Rfl: 2   ibuprofen (ADVIL) 600 MG tablet, Take 1 tablet (600 mg total) by mouth every 6 (six) hours. (Patient not taking: Reported on 06/06/2022), Disp: 60 tablet, Rfl: 1   Meclizine HCl (ANTIVERT) 25 MG CHEW, Chew 1 tablet by mouth 2 times daily as needed (Patient not taking: Reported on 06/06/2022), Disp: 5 tablet, Rfl: 0   mometasone (ELOCON) 0.1 % cream, Apply twice daily to affected areas up to 1 week as needed for rash, Disp: 15 g, Rfl: 1   norethindrone (INCASSIA) 0.35 MG tablet, Take 1 tablet (0.35 mg total) by mouth daily., Disp: 84 tablet, Rfl: 1   omeprazole (PRILOSEC) 20 MG capsule, Take 1 capsule (20 mg total) by mouth daily., Disp: 90 capsule, Rfl: 3   ondansetron (ZOFRAN-ODT) 4 MG disintegrating tablet, Take 1  tablet (4 mg total) by mouth every 8 (eight) hours as needed for nausea or vomiting., Disp: 20 tablet, Rfl: 0   Spacer/Aero-Holding Chambers (OPTICHAMBER ADVANTAGE-LG MASK) MISC, use with albuterol (Patient not taking: Reported on 06/06/2022), Disp: 1 each, Rfl: 0   valACYclovir (VALTREX) 1000 MG tablet, Take 1 tablet (1,000 mg total) by mouth daily., Disp: 90 tablet, Rfl: 3   Medications ordered in this encounter:  Meds ordered this encounter  Medications   amoxicillin-clavulanate (AUGMENTIN) 875-125 MG tablet    Sig: Take 1 tablet by mouth 2 (two) times daily.    Dispense:  20 tablet    Refill:  0    Order Specific Question:   Supervising Provider    Answer:   Merrilee Jansky [id number]     *If you need refills on other medications prior to your next appointment, please contact your pharmacy*  Follow-Up: Call back or seek an in-person evaluation if the symptoms worsen or if the condition fails to improve as anticipated.  [facility] [phone]  Other Instructions Take antibiotic as prescribed.  If no improvement follow up for in person evaluation. Recommend salt water gargles, tylenol or ibuprofen as needed.    If you have been instructed to have an in-person evaluation today at a local Urgent Care facility, please use the link below. It will take you to a list of all of our available [facility], including address, phone number and hours of operation. Please do not delay  care.  [facility]  If you or a family member do not have a primary care provider, use the link below to schedule a visit and establish care. When you choose a [facility] primary care physician or advanced practice provider, you gain a long-term partner in health. Find a Primary Care Provider  Learn more about [facility]'s in-office and virtual care options: [facility]

## 2022-09-02 NOTE — Progress Notes (Signed)
Virtual Visit Consent   [name], you are scheduled for a virtual visit with a [facility] provider today. Just as with appointments in the office, your consent must be obtained to participate. Your consent will be active for this visit and any virtual visit you may have with one of our providers in the next 365 days. If you have a MyChart account, a copy of this consent can be sent to you electronically.  As this is a virtual visit, video technology does not allow for your provider to perform a traditional examination. This may limit your provider's ability to fully assess your condition. If your provider identifies any concerns that need to be evaluated in person or the need to arrange testing (such as labs, EKG, etc.), we will make arrangements to do so. Although advances in technology are sophisticated, we cannot ensure that it will always work on either your end or our end. If the connection with a video visit is poor, the visit may have to be switched to a telephone visit. With either a video or telephone visit, we are not always able to ensure that we have a secure connection.  By engaging in this virtual visit, you consent to the provision of healthcare and authorize for your insurance to be billed (if applicable) for the services provided during this visit. Depending on your insurance coverage, you may receive a charge related to this service.  I need to obtain your verbal consent now. Are you willing to proceed with your visit today? Parrish Rahimi has provided verbal consent on 09/02/2022 for a virtual visit (video or telephone). Tylene Fantasia Ward, PA-C  Date: 09/02/2022 11:38 AM  Virtual Visit via Video Note   I, Tylene Fantasia Ward, connected with  Elizabeth Berger  ([id number], 1991/02/11) on 09/02/22 at 11:30 AM EDT by a video-enabled telemedicine application and verified that I am speaking with the correct person using two identifiers.  Location: Patient: Virtual Visit  Location Patient: Home Provider: Virtual Visit Location Provider: Home   I discussed the limitations of evaluation and management by telemedicine and the availability of in person appointments. The patient expressed understanding and agreed to proceed.    History of Present Illness: Elizabeth Berger is a 31 y.o. who identifies as a female who was assigned female at birth, and is being seen today for sore throat that started several days ago. She initially thought it was from allergies and has been treating with allergy medication with no relief.  Her daughter tested positive for strep throat today. She was treated with amoxicillin for strep throat last month with resolution of sx. She denies fever, congestion, cough. She reports feeling some swelling to the right side of her neck.  Sore throat feels similar to when she had strep throat last month.   HPI: HPI  Problems:  Patient Active Problem List   Diagnosis Date Noted   Urgency of urination 07/31/2022   Avitaminosis D 07/31/2022   Viral URI 04/26/2022   Atypical nevi 01/26/2022   Need for influenza vaccination 01/26/2022   Internal hemorrhoids 01/26/2022   Missed period 01/26/2022   Elevated LDL cholesterol level 01/26/2022   Tremor of both hands 01/26/2022   Chronic fatigue 01/26/2022   Elevated serum glucose 01/26/2022   Screening for thyroid disorder 01/26/2022   Post-dates pregnancy 07/02/2021   Generalized anxiety disorder 07/01/2021   Labor and delivery, indication for care 06/03/2021   COVID-19 affecting pregnancy in second trimester 03/28/2021  Back pain affecting pregnancy in second trimester 03/28/2021   Kidney stones    Herpes 09/01/2019   Annual physical exam 04/18/2019   History of kidney stones 04/18/2019   Gastroesophageal reflux disease 04/18/2019   Migraine without status migrainosus, not intractable 09/25/2017   Family history of breast cancer 09/25/2017   Family history of ovarian cancer 09/25/2017     Allergies: No Known Allergies Medications:  Current Outpatient Medications:    amoxicillin-clavulanate (AUGMENTIN) 875-125 MG tablet, Take 1 tablet by mouth 2 (two) times daily., Disp: 20 tablet, Rfl: 0   docusate sodium (COLACE) 100 MG capsule, Take 1 capsule (100 mg total) by mouth 2 (two) times daily as needed. (Patient not taking: Reported on 06/06/2022), Disp: 30 capsule, Rfl: 2   hydrOXYzine (ATARAX) 25 MG tablet, Take 1 tablet (25 mg total) by mouth every 6 (six) hours as needed for anxiety (sleep). (Patient not taking: Reported on 06/06/2022), Disp: 30 tablet, Rfl: 2   ibuprofen (ADVIL) 600 MG tablet, Take 1 tablet (600 mg total) by mouth every 6 (six) hours. (Patient not taking: Reported on 06/06/2022), Disp: 60 tablet, Rfl: 1   Meclizine HCl (ANTIVERT) 25 MG CHEW, Chew 1 tablet by mouth 2 times daily as needed (Patient not taking: Reported on 06/06/2022), Disp: 5 tablet, Rfl: 0   mometasone (ELOCON) 0.1 % cream, Apply twice daily to affected areas up to 1 week as needed for rash, Disp: 15 g, Rfl: 1   norethindrone (INCASSIA) 0.35 MG tablet, Take 1 tablet (0.35 mg total) by mouth daily., Disp: 84 tablet, Rfl: 1   omeprazole (PRILOSEC) 20 MG capsule, Take 1 capsule (20 mg total) by mouth daily., Disp: 90 capsule, Rfl: 3   ondansetron (ZOFRAN-ODT) 4 MG disintegrating tablet, Take 1 tablet (4 mg total) by mouth every 8 (eight) hours as needed for nausea or vomiting., Disp: 20 tablet, Rfl: 0   Spacer/Aero-Holding Chambers (OPTICHAMBER ADVANTAGE-LG MASK) MISC, use with albuterol (Patient not taking: Reported on 06/06/2022), Disp: 1 each, Rfl: 0   valACYclovir (VALTREX) 1000 MG tablet, Take 1 tablet (1,000 mg total) by mouth daily., Disp: 90 tablet, Rfl: 3  Observations/Objective: Patient is well-developed, well-nourished in no acute distress.  Resting comfortably at home.  Head is normocephalic, atraumatic.  No labored breathing.  Speech is clear and coherent with logical content.  Patient is alert  and oriented at baseline.    Assessment and Plan: 1. Pharyngitis, unspecified etiology - amoxicillin-clavulanate (AUGMENTIN) 875-125 MG tablet; Take 1 tablet by mouth 2 (two) times daily.  Dispense: 20 tablet; Refill: 0  Will treat for recurrent strep given sx and daughter also recently tested positive.   Follow Up Instructions: I discussed the assessment and treatment plan with the patient. The patient was provided an opportunity to ask questions and all were answered. The patient agreed with the plan and demonstrated an understanding of the instructions.  A copy of instructions were sent to the patient via MyChart unless otherwise noted below.     The patient was advised to call back or seek an in-person evaluation if the symptoms worsen or if the condition fails to improve as anticipated.  Time:  I spent 11 minutes with the patient via telehealth technology discussing the above problems/concerns.    Tylene Fantasia Ward, PA-C

## 2022-09-12 ENCOUNTER — Encounter: Payer: Self-pay | Admitting: Certified Nurse Midwife

## 2022-09-12 ENCOUNTER — Other Ambulatory Visit: Payer: Self-pay | Admitting: Certified Nurse Midwife

## 2022-09-12 DIAGNOSIS — L659 Nonscarring hair loss, unspecified: Secondary | ICD-10-CM

## 2022-09-13 ENCOUNTER — Other Ambulatory Visit: Payer: 59

## 2022-09-13 DIAGNOSIS — E559 Vitamin D deficiency, unspecified: Secondary | ICD-10-CM | POA: Diagnosis not present

## 2022-09-13 DIAGNOSIS — Z0000 Encounter for general adult medical examination without abnormal findings: Secondary | ICD-10-CM | POA: Diagnosis not present

## 2022-09-13 DIAGNOSIS — L659 Nonscarring hair loss, unspecified: Secondary | ICD-10-CM

## 2022-09-14 ENCOUNTER — Encounter: Payer: Self-pay | Admitting: Family Medicine

## 2022-09-14 ENCOUNTER — Other Ambulatory Visit: Payer: Self-pay

## 2022-09-14 ENCOUNTER — Other Ambulatory Visit: Payer: Self-pay | Admitting: Family Medicine

## 2022-09-14 DIAGNOSIS — E559 Vitamin D deficiency, unspecified: Secondary | ICD-10-CM

## 2022-09-14 LAB — CBC WITH DIFFERENTIAL/PLATELET
Basophils Absolute: 0.1 10*3/uL (ref 0.0–0.2)
Basos: 1 %
EOS (ABSOLUTE): 0.4 10*3/uL (ref 0.0–0.4)
Eos: 6 %
Hematocrit: 42.6 % (ref 34.0–46.6)
Hemoglobin: 13.8 g/dL (ref 11.1–15.9)
Immature Grans (Abs): 0 10*3/uL (ref 0.0–0.1)
Immature Granulocytes: 0 %
Lymphocytes Absolute: 2.3 10*3/uL (ref 0.7–3.1)
Lymphs: 34 %
MCH: 29.7 pg (ref 26.6–33.0)
MCHC: 32.4 g/dL (ref 31.5–35.7)
MCV: 92 fL (ref 79–97)
Monocytes Absolute: 0.5 10*3/uL (ref 0.1–0.9)
Monocytes: 7 %
Neutrophils Absolute: 3.5 10*3/uL (ref 1.4–7.0)
Neutrophils: 52 %
Platelets: 298 10*3/uL (ref 150–450)
RBC: 4.64 x10E6/uL (ref 3.77–5.28)
RDW: 13.3 % (ref 11.7–15.4)
WBC: 6.8 10*3/uL (ref 3.4–10.8)

## 2022-09-14 LAB — COMPREHENSIVE METABOLIC PANEL
ALT: 13 IU/L (ref 0–32)
AST: 15 IU/L (ref 0–40)
Albumin/Globulin Ratio: 1.5 (ref 1.2–2.2)
Albumin: 4.2 g/dL (ref 4.0–5.0)
Alkaline Phosphatase: 73 IU/L (ref 44–121)
BUN/Creatinine Ratio: 19 (ref 9–23)
BUN: 14 mg/dL (ref 6–20)
Bilirubin Total: <0.2 mg/dL (ref 0.0–1.2)
CO2: 20 mmol/L (ref 20–29)
Calcium: 9.3 mg/dL (ref 8.7–10.2)
Chloride: 105 mmol/L (ref 96–106)
Creatinine, Ser: 0.72 mg/dL (ref 0.57–1.00)
Globulin, Total: 2.8 g/dL (ref 1.5–4.5)
Glucose: 93 mg/dL (ref 70–99)
Potassium: 4.4 mmol/L (ref 3.5–5.2)
Sodium: 139 mmol/L (ref 134–144)
Total Protein: 7 g/dL (ref 6.0–8.5)
eGFR: 116 mL/min/{1.73_m2} (ref >59)

## 2022-09-14 LAB — TSH+FREE T4
Free T4: 1.28 ng/dL (ref 0.82–1.77)
TSH: 1.43 u[IU]/mL (ref 0.450–4.500)

## 2022-09-14 LAB — LIPID PANEL
Chol/HDL Ratio: 3.4 ratio (ref 0.0–4.4)
Cholesterol, Total: 164 mg/dL (ref 100–199)
HDL: 48 mg/dL (ref >39)
LDL Chol Calc (NIH): 89 mg/dL (ref 0–99)
Triglycerides: 154 mg/dL — ABNORMAL HIGH (ref 0–149)
VLDL Cholesterol Cal: 27 mg/dL (ref 5–40)

## 2022-09-14 LAB — VITAMIN D 25 HYDROXY (VIT D DEFICIENCY, FRACTURES): Vit D, 25-Hydroxy: 20 ng/mL — ABNORMAL LOW (ref 30.0–100.0)

## 2022-09-14 MED ORDER — VITAMIN D (ERGOCALCIFEROL) 1.25 MG (50000 UNIT) PO CAPS
50000.0000 [IU] | ORAL_CAPSULE | ORAL | 1 refills | Status: DC
  Filled 2022-09-14: qty 12, 84d supply, fill #0

## 2022-09-14 NOTE — Telephone Encounter (Signed)
Patient advised and verbalized understanding. Patient agrees with treatment plan.  

## 2022-09-14 NOTE — Progress Notes (Signed)
Vit D continues to remain low; recommend supplement- either 5000 IU Vit D3 daily or 50,000 IU Rx once weekly.  Cholesterol is improved. I continue to recommend diet low in saturated fat and regular exercise - 30 min at least 5 times per week  All other labs normal/stable.  Jacky Kindle, FNP  [facility] [address] [place], [place] [address] [phone] (phone) [phone] (fax) [facility]

## 2022-09-14 NOTE — Telephone Encounter (Signed)
-----   Message from Jacky Kindle, FNP sent at 09/14/2022  8:19 AM EDT ----- Vit D continues to remain low; recommend supplement- either 5000 IU Vit D3 daily or 50,000 IU Rx once weekly.  Cholesterol is improved. I continue to recommend diet low in saturated fat and regular exercise - 30 min at least 5 times per week  All other labs normal/stable.  Jacky Kindle, FNP  [facility] [address] [place], [place] [address] [phone] (phone) [phone] (fax) [facility]

## 2022-10-06 ENCOUNTER — Other Ambulatory Visit: Payer: Self-pay

## 2022-10-09 ENCOUNTER — Emergency Department
Admission: EM | Admit: 2022-10-09 | Discharge: 2022-10-09 | Disposition: A | Discharge status: Home / Self Care | Payer: 59 | Attending: Emergency Medicine | Admitting: Emergency Medicine

## 2022-10-09 ENCOUNTER — Other Ambulatory Visit: Payer: Self-pay

## 2022-10-09 ENCOUNTER — Encounter: Payer: Self-pay | Admitting: Emergency Medicine

## 2022-10-09 DIAGNOSIS — N390 Urinary tract infection, site not specified: Secondary | ICD-10-CM | POA: Diagnosis not present

## 2022-10-09 DIAGNOSIS — R42 Dizziness and giddiness: Secondary | ICD-10-CM | POA: Diagnosis not present

## 2022-10-09 LAB — URINALYSIS, ROUTINE W REFLEX MICROSCOPIC
Bilirubin Urine: NEGATIVE
Glucose, UA: NEGATIVE mg/dL
Hgb urine dipstick: NEGATIVE
Ketones, ur: NEGATIVE mg/dL
Nitrite: NEGATIVE
Protein, ur: NEGATIVE mg/dL
Specific Gravity, Urine: 1.027 (ref 1.005–1.030)
WBC, UA: >50 WBC/hpf (ref 0–5)
pH: 5 (ref 5.0–8.0)

## 2022-10-09 LAB — BASIC METABOLIC PANEL
Anion gap: 10 (ref 5–15)
BUN: 10 mg/dL (ref 6–20)
CO2: 21 mmol/L — ABNORMAL LOW (ref 22–32)
Calcium: 9 mg/dL (ref 8.9–10.3)
Chloride: 106 mmol/L (ref 98–111)
Creatinine, Ser: 0.68 mg/dL (ref 0.44–1.00)
GFR, Estimated: >60 mL/min (ref >60)
Glucose, Bld: 104 mg/dL — ABNORMAL HIGH (ref 70–99)
Potassium: 3.6 mmol/L (ref 3.5–5.1)
Sodium: 137 mmol/L (ref 135–145)

## 2022-10-09 LAB — CBC
HCT: 43.4 % (ref 36.0–46.0)
Hemoglobin: 14.4 g/dL (ref 12.0–15.0)
MCH: 30.5 pg (ref 26.0–34.0)
MCHC: 33.2 g/dL (ref 30.0–36.0)
MCV: 91.9 fL (ref 80.0–100.0)
Platelets: 284 10*3/uL (ref 150–400)
RBC: 4.72 MIL/uL (ref 3.87–5.11)
RDW: 13.2 % (ref 11.5–15.5)
WBC: 7 10*3/uL (ref 4.0–10.5)
nRBC: 0 % (ref 0.0–0.2)

## 2022-10-09 LAB — POC URINE PREG, ED: Preg Test, Ur: NEGATIVE

## 2022-10-09 MED ORDER — NITROFURANTOIN MONOHYD MACRO 100 MG PO CAPS
100.0000 mg | ORAL_CAPSULE | Freq: Two times a day (BID) | ORAL | 0 refills | Status: AC
  Filled 2022-10-09: qty 14, 7d supply, fill #0

## 2022-10-09 MED ORDER — LORAZEPAM 0.5 MG PO TABS
0.5000 mg | ORAL_TABLET | Freq: Every day | ORAL | 0 refills | Status: AC | PRN
  Filled 2022-10-09: qty 20, 20d supply, fill #0

## 2022-10-09 NOTE — ED Triage Notes (Signed)
C/O dizziness since Saturday.  States symptoms are intermittent.  AAOx3.  Skin warm and dry.  States symptoms improve when laying down. Also c/o chills.

## 2022-10-09 NOTE — ED Provider Notes (Addendum)
[facility] Provider Note    Event Date/Time   First MD Initiated Contact with Patient 10/09/22 1027     (approximate)  History   Chief Complaint: Dizziness  HPI  Elizabeth Berger is a 32 y.o. female with a past medical history of anxiety, pression, presents to the emergency department for dizziness.  According to the patient for the past few days she has intermittently been getting dizzy.  Patient states she has had symptoms similar in the past due to anxiety.  Patient states at times she will feel lightheaded or like she needs to sit down so she does not pass out.  States sometimes when she is reading text or on her phone her vision will be off and then she "starts overthinking things" and feels dizzy and lightheaded.  Patient denies any recent illnesses denies any fever cough congestion vomiting diarrhea, last menstrual period approximately 2 weeks ago.  No chest pain.  Physical Exam   Triage Vital Signs: ED Triage Vitals  Enc Vitals Group     BP 10/09/22 0948 121/88     Pulse Rate 10/09/22 0948 100     Resp 10/09/22 0948 16     Temp 10/09/22 0948 98 F (36.7 C)     Temp Source 10/09/22 0948 Oral     SpO2 10/09/22 0948 100 %     Weight 10/09/22 0945 141 lb 5 oz (64.1 kg)     Height 10/09/22 0945 5\' 2"  (1.575 m)     Head Circumference --      Peak Flow --      Pain Score 10/09/22 0946 0     Pain Loc --      Pain Edu? --      Excl. in GC? --     Most recent vital signs: Vitals:   10/09/22 0948  BP: 121/88  Pulse: 100  Resp: 16  Temp: 98 F (36.7 C)  SpO2: 100%    General: Awake, no distress, somewhat anxious pain. CV:  Good peripheral perfusion.  Regular rate and rhythm  Resp:  Normal effort.  Equal breath sounds bilaterally.  Abd:  No distention.  Soft, nontender.  No rebound or guarding.  ED Results / Procedures / Treatments   EKG  EKG viewed and interpreted by myself shows sinus tachycardia 107 bpm with a narrow QRS, normal  axis, normal intervals, no concerning ST changes.  MEDICATIONS ORDERED IN ED: Medications - No data to display   IMPRESSION / MDM / ASSESSMENT AND PLAN / ED COURSE  I reviewed the triage vital signs and the nursing notes.  Patient's presentation is most consistent with acute presentation with potential threat to life or bodily function.  Patient presents to the emergency department for dizziness.  Overall the patient appears well, no distress.  Patient states she has had symptoms in the past with anxiety but states this episode is lasted for 3 to 4 days and continues to occur intermittently.  Patient states often times she will have minimal symptoms and then "over thinks things" and then she starts getting dizzy or lightheaded.  States she had some leftover hydroxyzine from her last pregnancy, took 1 on Saturday but it did not help much with the symptoms.  Patient denies any shortness of breath or chest pain.  Reassuring physical exam, reassuring vitals besides slight tachycardia although the patient is quite anxious appearing.  Patient's workup shows a normal CBC, normal chemistry, negative pregnancy test.  Urinalysis is pending.  If the patient's urinalysis shows no concerning findings we will plan to discharge with a very short trial of Ativan.  Patient states she will follow-up with her doctor.  States she is prescribed Zoloft for anxiety but has not been taking it recently.  Patient's urinalysis does show signs of urinary tract infection.  Will cover with Macrobid as a precaution.  FINAL CLINICAL IMPRESSION(S) / ED DIAGNOSES   Dizziness UTI   Rx / DC Orders   Ativan  Note:  This document was prepared using Dragon voice recognition software and may include unintentional dictation errors.   Minna Antis, MD 10/09/22 1111    Minna Antis, MD 10/09/22 1231

## 2022-10-09 NOTE — ED Notes (Signed)
Patient is alert and oriented x4. Patient moves and speaks easily. No deficits noted.

## 2022-10-09 NOTE — ED Notes (Signed)
Patient declined discharge vital signs. 

## 2022-10-12 ENCOUNTER — Ambulatory Visit (INDEPENDENT_AMBULATORY_CARE_PROVIDER_SITE_OTHER): Payer: 59 | Admitting: Family Medicine

## 2022-10-12 ENCOUNTER — Other Ambulatory Visit: Payer: Self-pay

## 2022-10-12 ENCOUNTER — Encounter: Payer: Self-pay | Admitting: Family Medicine

## 2022-10-12 VITALS — BP 123/93 | HR 106 | Wt 144.0 lb

## 2022-10-12 DIAGNOSIS — F411 Generalized anxiety disorder: Secondary | ICD-10-CM | POA: Diagnosis not present

## 2022-10-12 DIAGNOSIS — I479 Paroxysmal tachycardia, unspecified: Secondary | ICD-10-CM | POA: Diagnosis not present

## 2022-10-12 DIAGNOSIS — F410 Panic disorder [episodic paroxysmal anxiety] without agoraphobia: Secondary | ICD-10-CM

## 2022-10-12 MED ORDER — VENLAFAXINE HCL ER 75 MG PO CP24
75.0000 mg | ORAL_CAPSULE | Freq: Every day | ORAL | 2 refills | Status: DC
  Filled 2022-10-12: qty 30, 30d supply, fill #0

## 2022-10-12 MED ORDER — HYDROXYZINE HCL 10 MG PO TABS
10.0000 mg | ORAL_TABLET | Freq: Four times a day (QID) | ORAL | 3 refills | Status: DC | PRN
  Filled 2022-10-12: qty 90, 23d supply, fill #0
  Filled 2023-02-25: qty 90, 23d supply, fill #1
  Filled 2023-06-18: qty 90, 23d supply, fill #2

## 2022-10-12 MED ORDER — PROPRANOLOL HCL 10 MG PO TABS
10.0000 mg | ORAL_TABLET | Freq: Two times a day (BID) | ORAL | 0 refills | Status: DC
  Filled 2022-10-12: qty 180, 90d supply, fill #0

## 2022-10-12 NOTE — Progress Notes (Signed)
Established patient visit  Patient: Elizabeth Berger   DOB: [birth year]   31 y.o. Female  MRN: [id number] Visit Date: 10/12/2022  Today's healthcare provider: Jacky Kindle, FNP  Introduced to nurse practitioner role and practice setting.  All questions answered.  Discussed provider/patient relationship and expectations.  Chief Complaint  Patient presents with   Anxiety   Subjective    Panic attack x2 days; unclear reason -concern about work performance Encouraged to reach out to [employer] and seek clarity for job role.   HPI   Pt stated-- having bad anxiety-started Friday. Last edited by Shelly Bombard, CMA on 10/12/2022  3:59 PM.      Medications: Outpatient Medications Prior to Visit  Medication Sig   amoxicillin-clavulanate (AUGMENTIN) 875-125 MG tablet Take 1 tablet by mouth 2 (two) times daily.   docusate sodium (COLACE) 100 MG capsule Take 1 capsule (100 mg total) by mouth 2 (two) times daily as needed.   ibuprofen (ADVIL) 600 MG tablet Take 1 tablet (600 mg total) by mouth every 6 (six) hours.   LORazepam (ATIVAN) 0.5 MG tablet Take 1 tablet (0.5 mg total) by mouth daily as needed for up to 20 days for anxiety.   Meclizine HCl (ANTIVERT) 25 MG CHEW Chew 1 tablet by mouth 2 times daily as needed   mometasone (ELOCON) 0.1 % cream Apply twice daily to affected areas up to 1 week as needed for rash   nitrofurantoin, macrocrystal-monohydrate, (MACROBID) 100 MG capsule Take 1 capsule (100 mg total) by mouth 2 (two) times daily for 7 days.   norethindrone (INCASSIA) 0.35 MG tablet Take 1 tablet (0.35 mg total) by mouth daily.   omeprazole (PRILOSEC) 20 MG capsule Take 1 capsule (20 mg total) by mouth daily.   ondansetron (ZOFRAN-ODT) 4 MG disintegrating tablet Take 1 tablet (4 mg total) by mouth every 8 (eight) hours as needed for nausea or vomiting.   Spacer/Aero-Holding Chambers (OPTICHAMBER ADVANTAGE-LG MASK) MISC use with albuterol   valACYclovir (VALTREX) 1000 MG tablet Take 1  tablet (1,000 mg total) by mouth daily.   Vitamin D, Ergocalciferol, (DRISDOL) 1.25 MG (50000 UNIT) CAPS capsule Take 1 capsule (50,000 Units total) by mouth every 7 (seven) days.   [DISCONTINUED] hydrOXYzine (ATARAX) 25 MG tablet Take 1 tablet (25 mg total) by mouth every 6 (six) hours as needed for anxiety (sleep).   No facility-administered medications prior to visit.   Review of Systems    Objective    BP (!) 123/93   Pulse (!) 106   Wt 144 lb (65.3 kg)   LMP 09/13/2022 (Approximate)   BMI 26.34 kg/m   Physical Exam Vitals and nursing note reviewed.  Constitutional:      General: She is not in acute distress.    Appearance: Normal appearance. She is overweight. She is not ill-appearing, toxic-appearing or diaphoretic.  HENT:     Head: Normocephalic and atraumatic.  Cardiovascular:     Rate and Rhythm: Regular rhythm. Tachycardia present.     Pulses: Normal pulses.     Heart sounds: Normal heart sounds. No murmur heard.    No friction rub. No gallop.  Pulmonary:     Effort: Pulmonary effort is normal. No respiratory distress.     Breath sounds: Normal breath sounds. No stridor. No wheezing, rhonchi or rales.  Chest:     Chest wall: No tenderness.  Musculoskeletal:        General: No swelling, tenderness, deformity or signs of injury. Normal range  of motion.     Right lower leg: No edema.     Left lower leg: No edema.  Skin:    General: Skin is warm and dry.     Capillary Refill: Capillary refill takes less than 2 seconds.     Coloration: Skin is not jaundiced or pale.     Findings: No bruising, erythema, lesion or rash.  Neurological:     General: No focal deficit present.     Mental Status: She is alert and oriented to person, place, and time. Mental status is at baseline.     Cranial Nerves: No cranial nerve deficit.     Sensory: No sensory deficit.     Motor: No weakness.     Coordination: Coordination normal.  Psychiatric:        Attention and Perception:  Attention normal.        Mood and Affect: Mood is anxious and depressed. Affect is tearful.        Speech: Speech normal.        Behavior: Behavior normal.        Thought Content: Thought content normal.        Judgment: Judgment normal.     No results found for any visits on 10/12/22.  Assessment & Plan     Problem List Items Addressed This Visit       Cardiovascular and Mediastinum   Paroxysmal tachycardia (HCC)    Restart BB to assist HR control Follow up as needed Normal HR 60-100      Relevant Medications   propranolol (INDERAL) 10 MG tablet     Other   Generalized anxiety disorder with panic attacks - Primary    Recommend start of XR medication and use of PRN medication as well as mindfulness to assist Follow up in 6-8 weeks.      Relevant Medications   hydrOXYzine (ATARAX) 10 MG tablet   venlafaxine XR (EFFEXOR XR) 75 MG 24 hr capsule   Return if symptoms worsen or fail to improve.     Leilani Merl, FNP, have reviewed all documentation for this visit. The documentation on 10/12/22 for the exam, diagnosis, procedures, and orders are all accurate and complete.  Jacky Kindle, FNP  [facility] [phone] (phone) [phone] (fax)  [facility]

## 2022-10-12 NOTE — Assessment & Plan Note (Signed)
Recommend start of XR medication and use of PRN medication as well as mindfulness to assist Follow up in 6-8 weeks.

## 2022-10-12 NOTE — Assessment & Plan Note (Signed)
Restart BB to assist HR control Follow up as needed Normal HR 60-100

## 2022-10-13 ENCOUNTER — Other Ambulatory Visit: Payer: Self-pay

## 2022-10-20 ENCOUNTER — Telehealth: Payer: Self-pay | Admitting: Family Medicine

## 2022-10-20 ENCOUNTER — Other Ambulatory Visit: Payer: Self-pay | Admitting: Family Medicine

## 2022-10-20 NOTE — Telephone Encounter (Signed)
  FMLA paperwork was received on 10/20/2022 & placed in provider's mailbox. Please allow 7-10 business days to be completed. Thank you

## 2022-10-25 ENCOUNTER — Telehealth: Payer: Self-pay | Admitting: Family Medicine

## 2022-10-25 NOTE — Telephone Encounter (Signed)
FMLA paperwork was received on 10/25/2022 & placed in provider's mailbox. Please allow 7-10 business days to be completed. Thank you

## 2022-10-31 ENCOUNTER — Encounter: Payer: Self-pay | Admitting: Family Medicine

## 2022-11-15 ENCOUNTER — Encounter: Payer: Self-pay | Admitting: Obstetrics and Gynecology

## 2022-11-15 ENCOUNTER — Ambulatory Visit (INDEPENDENT_AMBULATORY_CARE_PROVIDER_SITE_OTHER): Payer: 59 | Admitting: Obstetrics and Gynecology

## 2022-11-15 ENCOUNTER — Other Ambulatory Visit: Payer: Self-pay

## 2022-11-15 VITALS — BP 114/80 | HR 108 | Resp 16 | Ht 62.0 in | Wt 149.0 lb

## 2022-11-15 DIAGNOSIS — R350 Frequency of micturition: Secondary | ICD-10-CM | POA: Diagnosis not present

## 2022-11-15 DIAGNOSIS — L659 Nonscarring hair loss, unspecified: Secondary | ICD-10-CM

## 2022-11-15 DIAGNOSIS — E559 Vitamin D deficiency, unspecified: Secondary | ICD-10-CM

## 2022-11-15 DIAGNOSIS — N6322 Unspecified lump in the left breast, upper inner quadrant: Secondary | ICD-10-CM

## 2022-11-15 DIAGNOSIS — Z01419 Encounter for gynecological examination (general) (routine) without abnormal findings: Secondary | ICD-10-CM

## 2022-11-15 DIAGNOSIS — F410 Panic disorder [episodic paroxysmal anxiety] without agoraphobia: Secondary | ICD-10-CM

## 2022-11-15 LAB — POCT URINALYSIS DIPSTICK
Bilirubin, UA: NEGATIVE
Blood, UA: POSITIVE
Glucose, UA: NEGATIVE
Ketones, UA: NEGATIVE
Leukocytes, UA: NEGATIVE
Nitrite, UA: NEGATIVE
Protein, UA: POSITIVE — AB
Spec Grav, UA: 1.015 (ref 1.010–1.025)
Urobilinogen, UA: 0.2 E.U./dL
pH, UA: 5 (ref 5.0–8.0)

## 2022-11-15 MED ORDER — VITAMIN D (ERGOCALCIFEROL) 1.25 MG (50000 UNIT) PO CAPS
50000.0000 [IU] | ORAL_CAPSULE | ORAL | 0 refills | Status: DC
  Filled 2022-11-15: qty 5, 35d supply, fill #0

## 2022-11-15 MED ORDER — NORETHINDRONE 0.35 MG PO TABS
1.0000 | ORAL_TABLET | Freq: Every day | ORAL | 3 refills | Status: DC
  Filled 2022-11-15: qty 84, 84d supply, fill #0

## 2022-11-15 NOTE — Progress Notes (Signed)
GYNECOLOGY ANNUAL PHYSICAL EXAM PROGRESS NOTE  Subjective:    Elizabeth Berger is a 32 y.o. G23P1001 female who presents for an annual exam. The patient is sexually active. The patient participates in regular exercise: no. Has the patient ever been transfused or tattooed?: yes. The patient reports that there is not domestic violence in her life.   The patient has the following complaints today:  Reports breast pain that has come and gone for years, usually related to menstrual cycles. Had also noticed a lump in her left breast that fluctuates with her cycle. Mentioned to her previous GYN (Dr. Vena Austria of [facility]) who told her it was most likely hormonally related. Reports that this past month the lump became painful, previously has not been.  Thinks that her birth control Geophysicist/field seismologist) is causing hair loss and mood changes.  Is feeling more emotional lately. She has a history of GAD, managed with Effexor.  Patient desires to be worked up for a UTI.  Notes that she does not urinate much during the day, but notes increased urinary frequency at night. Denies dysuria or hematuria, however reports similar presentation the last time that she was diagnosed with a UTI.   Menstrual History: Menarche age: 18 Patient's last menstrual period was 11/14/2022 (exact date). Period Cycle (Days): 28 Period Duration (Days): 3-5 Period Pattern: Regular Menstrual Flow: Heavy Menstrual Control: Tampon Menstrual Control Change Freq (Hours): 3 Dysmenorrhea: None     Gynecologic History:  Contraception: oral progesterone-only contraceptive History of STI's:  Last Pap: 12/13/2020. Results were: normal. Denies h/o abnormal pap smears. Last mammogram: Not age appropriate    Upstream - 11/15/22 1327       Pregnancy Intention Screening   Does the patient want to become pregnant in the next year? No    Does the patient's partner want to become pregnant in the next year? No    Would the  patient like to discuss contraceptive options today? No      Contraception Wrap Up   Current Method Oral Contraceptive    End Method Oral Contraceptive    Contraception Counseling Provided No    How was the end contraceptive method provided? Prescription            The pregnancy intention screening data noted above was reviewed. Potential methods of contraception were discussed. The patient elected to proceed with Oral Contraceptive.   OB History  Gravida Para Term Preterm AB Living  1 1 1  0 0 1  SAB IAB Ectopic Multiple Live Births  0 0 0 0 1    # Outcome Date GA Lbr Len/2nd Weight Sex Type Anes PTL Lv  1 Term 06/30/21 [redacted]w[redacted]d / 04:49 7 lb 13.9 oz (3.57 kg) F Vag-Vacuum EPI, Local  LIV     Name: Reindel,GIRL Anni     Apgar1: 6  Apgar5: 9    Past Medical History:  Diagnosis Date   Anxiety    Depression    FH: migraines    mom   Herpes    1&2 IgG+   Migraines     Past Surgical History:  Procedure Laterality Date   TONSILLECTOMY      Family History  Problem Relation Age of Onset   Arthritis Mother    Depression Mother    Cervical cancer Mother    Depression Maternal Grandmother    Cancer Maternal Grandmother 25       Breast   Cancer Maternal Grandfather        ?  type ?bone? liver vs kidney   Hearing loss Maternal Grandfather     Social History   Socioeconomic History   Marital status: Single    Spouse name: Devon   Number of children: Not on file   Years of education: Not on file   Highest education level: Not on file  Occupational History   Not on file  Tobacco Use   Smoking status: Former   Smokeless tobacco: Never   Tobacco comments:    social smoker   Vaping Use   Vaping status: Never Used  Substance and Sexual Activity   Alcohol use: Not Currently   Drug use: Not Currently   Sexual activity: Not Currently    Partners: Male    Comment: undecided  Other Topics Concern   Not on file  Social History Narrative       Education officer, [employer] [place]    HS ed    No kids    Wears seat belt, no guns, safe in relationship    Social Determinants of Health   Financial Resource Strain: Not on file  Food Insecurity: Not on file  Transportation Needs: Not on file  Physical Activity: Not on file  Stress: Not on file  Social Connections: Not on file  Intimate Partner Violence: Not on file    Current Outpatient Medications on File Prior to Visit  Medication Sig Dispense Refill   amoxicillin-clavulanate (AUGMENTIN) 875-125 MG tablet Take 1 tablet by mouth 2 (two) times daily. 20 tablet 0   docusate sodium (COLACE) 100 MG capsule Take 1 capsule (100 mg total) by mouth 2 (two) times daily as needed. 30 capsule 2   hydrOXYzine (ATARAX) 10 MG tablet Take 1 tablet (10 mg total) by mouth every 6 (six) hours as needed for anxiety (sleep). 90 tablet 3   ibuprofen (ADVIL) 600 MG tablet Take 1 tablet (600 mg total) by mouth every 6 (six) hours. 60 tablet 1   Meclizine HCl (ANTIVERT) 25 MG CHEW Chew 1 tablet by mouth 2 times daily as needed 5 tablet 0   mometasone (ELOCON) 0.1 % cream Apply twice daily to affected areas up to 1 week as needed for rash 15 g 1   norethindrone (INCASSIA) 0.35 MG tablet Take 1 tablet (0.35 mg total) by mouth daily. 84 tablet 1   omeprazole (PRILOSEC) 20 MG capsule Take 1 capsule (20 mg total) by mouth daily. 90 capsule 3   ondansetron (ZOFRAN-ODT) 4 MG disintegrating tablet Take 1 tablet (4 mg total) by mouth every 8 (eight) hours as needed for nausea or vomiting. 20 tablet 0   propranolol (INDERAL) 10 MG tablet Take 1 tablet (10 mg total) by mouth 2 (two) times daily. 180 tablet 0   Spacer/Aero-Holding Chambers (OPTICHAMBER ADVANTAGE-LG MASK) MISC use with albuterol 1 each 0   valACYclovir (VALTREX) 1000 MG tablet Take 1 tablet (1,000 mg total) by mouth daily. 90 tablet 3   venlafaxine XR (EFFEXOR XR) 75 MG 24 hr capsule Take 1 capsule (75 mg total) by mouth at bedtime. 30 capsule 2    Vitamin D, Ergocalciferol, (DRISDOL) 1.25 MG (50000 UNIT) CAPS capsule Take 1 capsule (50,000 Units total) by mouth every 7 (seven) days. 13 capsule 1   No current facility-administered medications on file prior to visit.    No Known Allergies   Review of Systems Constitutional: negative for chills, fatigue, fevers and sweats Eyes: negative for irritation, redness and visual disturbance Ears, nose, mouth, throat, and face: negative  for hearing loss, nasal congestion, snoring and tinnitus Respiratory: negative for asthma, cough, sputum Cardiovascular: negative for chest pain, dyspnea, exertional chest pressure/discomfort, irregular heart beat, palpitations and syncope Gastrointestinal: negative for abdominal pain, change in bowel habits, nausea and vomiting Genitourinary: negative for abnormal menstrual periods, genital lesions, sexual problems and vaginal discharge, dysuria and urinary incontinence. Positive for urinary frequency Integument/breast: positive for breast lump, breast tenderness of left breast. Denies and nipple discharge Hematologic/lymphatic: negative for bleeding and easy bruising Musculoskeletal:negative for back pain and muscle weakness Neurological: negative for dizziness, headaches, vertigo and weakness Endocrine: negative for diabetic symptoms including polydipsia, polyuria and skin dryness Allergic/Immunologic: negative for hay fever and urticaria      Objective:  Blood pressure 114/80, pulse (!) 108, resp. rate 16, height 5\' 2"  (1.575 m), weight 149 lb (67.6 kg), last menstrual period 11/14/2022, not currently breastfeeding.  Body mass index is 27.25 kg/m.    General Appearance:    Alert, cooperative, no distress, appears stated age, overweight.   Head:    Normocephalic, without obvious abnormality, atraumatic  Eyes:    PERRL, conjunctiva/corneas clear, EOM's intact, both eyes  Ears:    Normal external ear canals, both ears  Nose:   Nares normal, septum  midline, mucosa normal, no drainage or sinus tenderness  Throat:   Lips, mucosa, and tongue normal; teeth and gums normal  Neck:   Supple, symmetrical, trachea midline, no adenopathy; thyroid: no enlargement/tenderness/nodules; no carotid bruit or JVD  Back:     Symmetric, no curvature, ROM normal, no CVA tenderness  Lungs:     Clear to auscultation bilaterally, respirations unlabored  Chest Wall:    No tenderness or deformity   Heart:    Regular rate and rhythm, S1 and S2 normal, no murmur, rub or gallop  Breast Exam:    No tenderness, masses, or nipple abnormality  Abdomen:     Soft, non-tender, bowel sounds active all four quadrants, no masses, no organomegaly.    Genitalia:    Pelvic deferred today as patient is on her menses.  Extremities:   Extremities normal, atraumatic, no cyanosis or edema  Pulses:   2+ and symmetric all extremities  Skin:   Skin color, texture, turgor normal, no rashes or lesions  Lymph nodes:   Cervical, supraclavicular, and axillary nodes normal  Neurologic:   CNII-XII intact, normal strength, sensation and reflexes throughout     Labs:  Lab Results  Component Value Date   WBC 7.0 10/09/2022   HGB 14.4 10/09/2022   HCT 43.4 10/09/2022   MCV 91.9 10/09/2022   PLT 284 10/09/2022    Lab Results  Component Value Date   CREATININE 0.68 10/09/2022   BUN 10 10/09/2022   NA 137 10/09/2022   K 3.6 10/09/2022   CL 106 10/09/2022   CO2 21 (L) 10/09/2022    Lab Results  Component Value Date   ALT 13 09/13/2022   AST 15 09/13/2022   ALKPHOS 73 09/13/2022   BILITOT <0.2 09/13/2022    Lab Results  Component Value Date   TSH 1.430 09/13/2022     Results for orders placed or performed in visit on 11/15/22  POCT Urinalysis Dipstick  Result Value Ref Range   Color, UA     Clarity, UA     Glucose, UA Negative Negative   Bilirubin, UA Negative    Ketones, UA Negative    Spec Grav, UA 1.015 1.010 - 1.025   Blood, UA Positive  pH, UA 5.0 5.0 - 8.0    Protein, UA Positive (A) Negative   Urobilinogen, UA 0.2 0.2 or 1.0 E.U./dL   Nitrite, UA Negative    Leukocytes, UA Negative Negative   Appearance     Odor       Assessment:   1. Encounter for well woman exam with routine gynecological exam   2. Mass of upper inner quadrant of left breast   3. Urinary frequency   4. Hair loss   5. Generalized anxiety disorder with panic attacks   6. Avitaminosis D      Plan:  1. Encounter for well woman exam with routine gynecological exam - Blood tests: UTD by PCP. - Breast self exam technique reviewed and patient encouraged to perform self-exam monthly. - Discussed healthy lifestyle modifications. - Pap smear UTD. - Contraception: oral progesterone-only contraceptive.  Desires to think about a possible different option as she feels like this may be the cause of some of her symptoms including her hair loss.  Discussed other nonhormonal options including barrier methods, Phexxi, and nonhormonal IUD.  Utilize estrogen containing products due to history of migraines.  2. Mass of upper inner quadrant of left breast - Patient with left breast mass, possibly cyst versus small fibroadenoma.  Also with breast tenderness that alternates with cycle.  As patient thinking about nonhormonal option for birth control due to possible side effects, advised on other comfort measures including ice packs, NSAIDs, and if pain persists or if ultrasound noting possible fibrocystic changes can consider use of danazol.  Ultrasound ordered. - [place] LIMITED ULTRASOUND INCLUDING AXILLA LEFT BREAST ; Future  3. Urinary frequency -Desires to be screened for UTI.  Currently with small amount of blood however patient is also on menstrual cycle.  No nitrites present only small amount of protein.  Encourage increasing hydration, use of cranberry juice for any irritative symptoms. - POCT Urinalysis Dipstick  4. Hair loss -Thinks this may be due to her birth control, considering  other options for birth control at this time.  Has had prior vitamin and hormonal workup in the past which was negative.  Will notify if decision is made to transition from Micronor.  5. Generalized anxiety disorder with panic attacks -History of anxiety, currently being managed with Effexor by PCP.  6. Avitaminosis D -Patient with low vitamin D.  Notes that the last 5 pills of her prescription were left in the car and have melted due to the summer heat.  Needs a refill of the medication so that she can complete her therapy.    Follow up in 1 year for annual exam. To follow up sooner if symptoms persist or worsen.    Hildred Laser, MD [facility]

## 2022-11-28 ENCOUNTER — Other Ambulatory Visit: Payer: 59

## 2022-12-08 ENCOUNTER — Ambulatory Visit
Admission: RE | Admit: 2022-12-08 | Discharge: 2022-12-08 | Disposition: A | Discharge status: Home / Self Care | Payer: 59 | Source: Ambulatory Visit | Attending: Obstetrics and Gynecology | Admitting: Obstetrics and Gynecology

## 2022-12-08 DIAGNOSIS — N6322 Unspecified lump in the left breast, upper inner quadrant: Secondary | ICD-10-CM

## 2022-12-08 DIAGNOSIS — N6320 Unspecified lump in the left breast, unspecified quadrant: Secondary | ICD-10-CM | POA: Diagnosis not present

## 2022-12-11 ENCOUNTER — Other Ambulatory Visit: Payer: Self-pay | Admitting: Obstetrics and Gynecology

## 2022-12-11 DIAGNOSIS — N63 Unspecified lump in unspecified breast: Secondary | ICD-10-CM

## 2022-12-11 DIAGNOSIS — R928 Other abnormal and inconclusive findings on diagnostic imaging of breast: Secondary | ICD-10-CM

## 2022-12-15 ENCOUNTER — Encounter: Payer: Self-pay | Admitting: Family Medicine

## 2022-12-15 ENCOUNTER — Ambulatory Visit (INDEPENDENT_AMBULATORY_CARE_PROVIDER_SITE_OTHER): Payer: 59 | Admitting: Family Medicine

## 2022-12-15 VITALS — BP 99/70 | HR 103 | Ht 62.0 in | Wt 147.0 lb

## 2022-12-15 DIAGNOSIS — G43C0 Periodic headache syndromes in child or adult, not intractable: Secondary | ICD-10-CM

## 2022-12-15 DIAGNOSIS — R3915 Urgency of urination: Secondary | ICD-10-CM

## 2022-12-15 DIAGNOSIS — R29818 Other symptoms and signs involving the nervous system: Secondary | ICD-10-CM

## 2022-12-15 DIAGNOSIS — E559 Vitamin D deficiency, unspecified: Secondary | ICD-10-CM

## 2022-12-15 DIAGNOSIS — I479 Paroxysmal tachycardia, unspecified: Secondary | ICD-10-CM | POA: Diagnosis not present

## 2022-12-15 DIAGNOSIS — F4329 Adjustment disorder with other symptoms: Secondary | ICD-10-CM | POA: Diagnosis not present

## 2022-12-15 LAB — POCT URINALYSIS DIPSTICK
Bilirubin, UA: NEGATIVE
Blood, UA: NEGATIVE
Glucose, UA: NEGATIVE
Ketones, UA: NEGATIVE
Nitrite, UA: NEGATIVE
Protein, UA: NEGATIVE
Spec Grav, UA: 1.015 (ref 1.010–1.025)
Urobilinogen, UA: 0.2 E.U./dL
pH, UA: 6 (ref 5.0–8.0)

## 2022-12-15 MED ORDER — PROCHLORPERAZINE MALEATE 10 MG PO TABS: 10.0000 mg | ORAL_TABLET | Freq: Four times a day (QID) | ORAL | 0 refills | Status: DC | PRN

## 2022-12-15 MED ORDER — NITROFURANTOIN MONOHYD MACRO 100 MG PO CAPS: 100.0000 mg | ORAL_CAPSULE | Freq: Two times a day (BID) | ORAL | 0 refills | Status: DC

## 2022-12-15 MED ORDER — VITAMIN D (ERGOCALCIFEROL) 1.25 MG (50000 UNIT) PO CAPS: 50000.0000 [IU] | ORAL_CAPSULE | ORAL | 0 refills | Status: DC

## 2022-12-15 NOTE — Assessment & Plan Note (Signed)
Referral to neuro to assist Reports symptoms of 'brain zaps' and complaints like her head is being 'moved around on a rollercoaster' No focal deficits noted

## 2022-12-15 NOTE — Assessment & Plan Note (Signed)
Referral to neurology to assist; reports ongoing "brain zaps" unable to disclose upcoming events prior Is not UTD on vision Does not wear blue light glasses or control glare screen on computer No acute deficits noted Continue to work to identify stressors and anxiety sources and address what is and not in pt control

## 2022-12-15 NOTE — Progress Notes (Signed)
Established patient visit   Patient: Elizabeth Berger   DOB: April 01, 1991   32 y.o. Female  MRN: [id number] Visit Date: 12/15/2022  Today's healthcare provider: Jacky Kindle, FNP  Introduced to nurse practitioner role and practice setting.  All questions answered.  Discussed provider/patient relationship and expectations.  Subjective    HPI HPI     Medical Management of Chronic Issues    Additional comments: Symptoms started last week, no new stress, "Brain zap " is when patient feels theres and electric shock in the brain and then she feels dizzy, patient looks at screen all day which could be the root cause for the optic pain, no prior treatment       Last edited by Rolly Salter, CMA on 12/15/2022  3:58 PM.      Medications: Outpatient Medications Prior to Visit  Medication Sig   hydrOXYzine (ATARAX) 10 MG tablet Take 1 tablet (10 mg total) by mouth every 6 (six) hours as needed for anxiety (sleep).   norethindrone (INCASSIA) 0.35 MG tablet Take 1 tablet (0.35 mg total) by mouth daily.   propranolol (INDERAL) 10 MG tablet Take 1 tablet (10 mg total) by mouth 2 (two) times daily.   venlafaxine XR (EFFEXOR XR) 75 MG 24 hr capsule Take 1 capsule (75 mg total) by mouth at bedtime.   [DISCONTINUED] docusate sodium (COLACE) 100 MG capsule Take 1 capsule (100 mg total) by mouth 2 (two) times daily as needed.   [DISCONTINUED] ibuprofen (ADVIL) 600 MG tablet Take 1 tablet (600 mg total) by mouth every 6 (six) hours.   [DISCONTINUED] omeprazole (PRILOSEC) 20 MG capsule Take 1 capsule (20 mg total) by mouth daily.   [DISCONTINUED] ondansetron (ZOFRAN-ODT) 4 MG disintegrating tablet Take 1 tablet (4 mg total) by mouth every 8 (eight) hours as needed for nausea or vomiting.   [DISCONTINUED] Spacer/Aero-Holding Chambers (OPTICHAMBER ADVANTAGE-LG MASK) MISC use with albuterol   [DISCONTINUED] valACYclovir (VALTREX) 1000 MG tablet Take 1 tablet (1,000 mg total) by mouth daily.    [DISCONTINUED] Vitamin D, Ergocalciferol, (DRISDOL) 1.25 MG (50000 UNIT) CAPS capsule Take 1 capsule (50,000 Units total) by mouth every 7 (seven) days.   [DISCONTINUED] Meclizine HCl (ANTIVERT) 25 MG CHEW Chew 1 tablet by mouth 2 times daily as needed (Patient not taking: Reported on 12/15/2022)   No facility-administered medications prior to visit.       Objective    BP 99/70 (BP Location: Left Arm, Patient Position: Sitting, Cuff Size: Large)   Pulse (!) 103   Ht 5\' 2"  (1.575 m)   Wt 147 lb (66.7 kg)   LMP 11/14/2022 (Exact Date)   SpO2 99%   BMI 26.89 kg/m   Physical Exam Vitals and nursing note reviewed.  Constitutional:      General: She is not in acute distress.    Appearance: Normal appearance. She is overweight. She is not ill-appearing, toxic-appearing or diaphoretic.  HENT:     Head: Normocephalic and atraumatic.  Cardiovascular:     Rate and Rhythm: Regular rhythm. Tachycardia present.     Pulses: Normal pulses.     Heart sounds: Normal heart sounds. No murmur heard.    No friction rub. No gallop.  Pulmonary:     Effort: Pulmonary effort is normal. No respiratory distress.     Breath sounds: Normal breath sounds. No stridor. No wheezing, rhonchi or rales.  Chest:     Chest wall: No tenderness.  Musculoskeletal:  General: No swelling, tenderness, deformity or signs of injury. Normal range of motion.     Right lower leg: No edema.     Left lower leg: No edema.  Skin:    General: Skin is warm and dry.     Capillary Refill: Capillary refill takes less than 2 seconds.     Coloration: Skin is not jaundiced or pale.     Findings: No bruising, erythema, lesion or rash.  Neurological:     General: No focal deficit present.     Mental Status: She is alert and oriented to person, place, and time. Mental status is at baseline.     Cranial Nerves: No cranial nerve deficit.     Sensory: No sensory deficit.     Motor: No weakness.     Coordination: Coordination  normal.  Psychiatric:        Mood and Affect: Mood is depressed. Affect is flat and tearful.        Behavior: Behavior normal.        Thought Content: Thought content normal.        Judgment: Judgment normal.      Results for orders placed or performed in visit on 12/15/22  POCT Urinalysis Dipstick  Result Value Ref Range   Color, UA yellow    Clarity, UA clear    Glucose, UA Negative Negative   Bilirubin, UA Negative    Ketones, UA Negative    Spec Grav, UA 1.015 1.010 - 1.025   Blood, UA Negative    pH, UA 6.0 5.0 - 8.0   Protein, UA Negative Negative   Urobilinogen, UA 0.2 0.2 or 1.0 E.U./dL   Nitrite, UA Negative    Leukocytes, UA Trace (A) Negative   Appearance     Odor      Assessment & Plan     Problem List Items Addressed This Visit       Cardiovascular and Mediastinum   Paroxysmal tachycardia (HCC)    Chronic, intermittent iso stress and dehydration Pt with borderline hypotension today s/s poor PO intake HR 100-120s, ST Defer changes to BB at this time; reports taking 10 mg propranolol nightly       Periodic headache syndrome, not intractable    Referral to neurology to assist; reports ongoing "brain zaps" unable to disclose upcoming events prior Is not UTD on vision Does not wear blue light glasses or control glare screen on computer No acute deficits noted Continue to work to identify stressors and anxiety sources and address what is and not in pt control       Relevant Orders   Ambulatory referral to Neurology     Other   Avitaminosis D    Chronic, repeat refills given previous low 3 months ago Defer labs at this time       Stress and adjustment reaction    Ongoing issues with adjustment; difficult for patient to put into words Pt comes to appt often after long day of work, with child in tow Reports her SO is helpful and she has family in the area as well Ongoing vague complaints; however, pt is well appearing but saddened, flat affect        Relevant Orders   Ambulatory referral to Neurology   Transient neurological symptoms    Referral to neuro to assist Reports symptoms of 'brain zaps' and complaints like her head is being 'moved around on a rollercoaster' No focal deficits noted      Relevant  Orders   Ambulatory referral to Neurology   Urgency of urination - Primary    +leuks in UA; continue to recommend tx given ongoing c/o nausea which pt previously noted with UTI in June Continue to monitor s/s; return if not improved      Relevant Orders   POCT Urinalysis Dipstick (Completed)   Return if symptoms worsen or fail to improve.      Leilani Merl, FNP, have reviewed all documentation for this visit. The documentation on 12/15/22 for the exam, diagnosis, procedures, and orders are all accurate and complete.  Jacky Kindle, FNP  [facility] [phone] (phone) [phone] (fax)  [facility]

## 2022-12-15 NOTE — Assessment & Plan Note (Addendum)
Chronic, intermittent iso stress and dehydration Pt with borderline hypotension today s/s poor PO intake HR 100-120s, ST Defer changes to BB at this time; reports taking 10 mg propranolol nightly

## 2022-12-15 NOTE — Assessment & Plan Note (Signed)
Ongoing issues with adjustment; difficult for patient to put into words Pt comes to appt often after long day of work, with child in tow Reports her SO is helpful and she has family in the area as well Ongoing vague complaints; however, pt is well appearing but saddened, flat affect

## 2022-12-15 NOTE — Assessment & Plan Note (Signed)
+  leuks in UA; continue to recommend tx given ongoing c/o nausea which pt previously noted with UTI in June Continue to monitor s/s; return if not improved

## 2022-12-15 NOTE — Assessment & Plan Note (Signed)
Chronic, repeat refills given previous low 3 months ago Defer labs at this time

## 2022-12-18 ENCOUNTER — Ambulatory Visit
Admission: RE | Admit: 2022-12-18 | Discharge: 2022-12-18 | Disposition: A | Discharge status: Home / Self Care | Payer: 59 | Source: Ambulatory Visit | Attending: Obstetrics and Gynecology | Admitting: Obstetrics and Gynecology

## 2022-12-18 DIAGNOSIS — N63 Unspecified lump in unspecified breast: Secondary | ICD-10-CM | POA: Insufficient documentation

## 2022-12-18 DIAGNOSIS — R928 Other abnormal and inconclusive findings on diagnostic imaging of breast: Secondary | ICD-10-CM | POA: Insufficient documentation

## 2022-12-18 DIAGNOSIS — N6325 Unspecified lump in the left breast, overlapping quadrants: Secondary | ICD-10-CM | POA: Diagnosis not present

## 2022-12-18 HISTORY — PX: BREAST BIOPSY: SHX20

## 2022-12-18 MED ORDER — LIDOCAINE-EPINEPHRINE 1 %-1:100000 IJ SOLN
5.0000 mL | Freq: Once | INTRAMUSCULAR | Status: AC
  Administered 2022-12-18: 5 mL
  Filled 2022-12-18: qty 5

## 2022-12-18 MED ORDER — LIDOCAINE 1 % OPTIME INJ - NO CHARGE
1.0000 mL | Freq: Once | INTRAMUSCULAR | Status: AC
  Administered 2022-12-18: 1 mL via INTRADERMAL
  Filled 2022-12-18: qty 2

## 2023-01-10 IMAGING — US US PELVIS COMPLETE WITH TRANSVAGINAL
2 series · 13 of 25 positions shown · non-contrast
Comparison: Ultrasound 02/27/2018 (no report)

CLINICAL DATA: Infertility, missed menses



[Series 1: us pelvic complete with transvaginal · 4 of 68 slices shown]
[im 1/68]
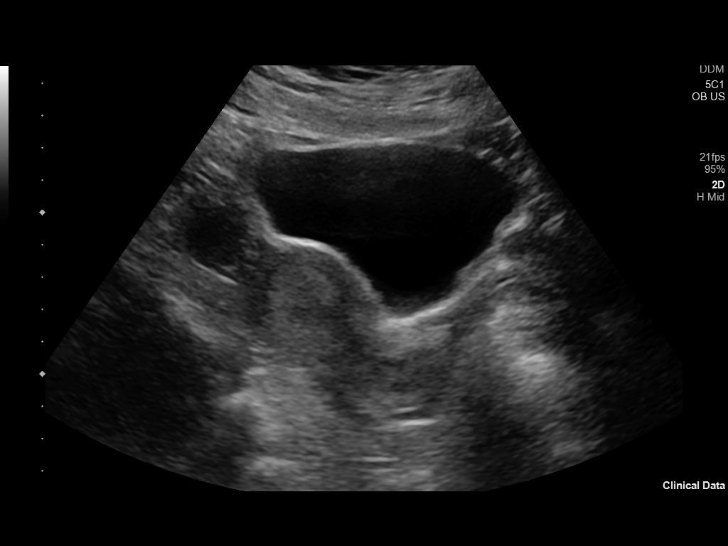
[im 20/68]
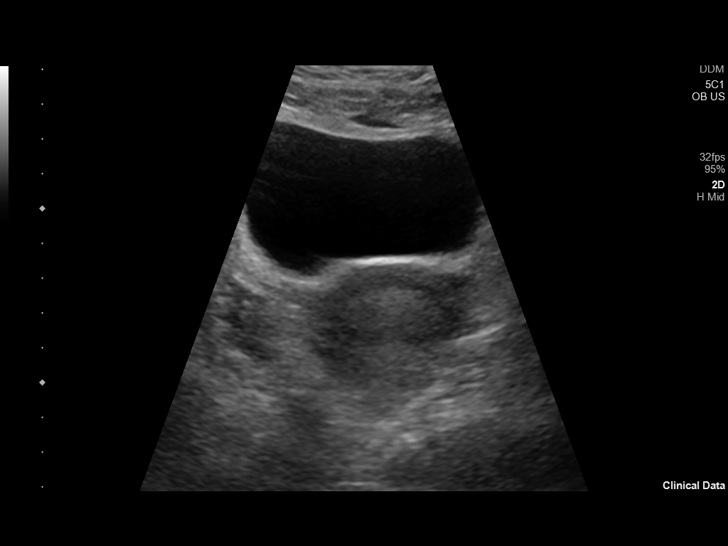
[im 39/68]
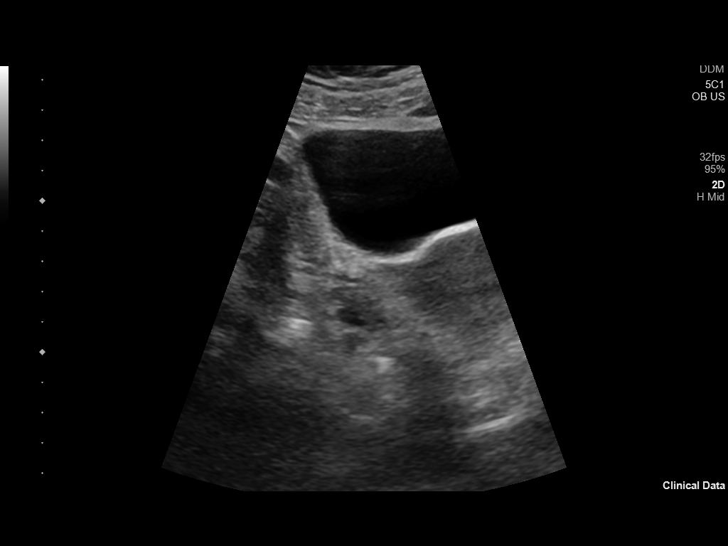
[im 58/68]
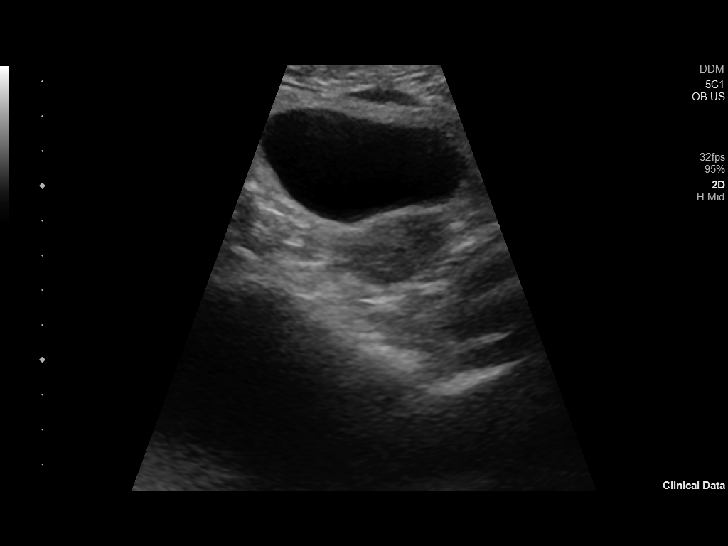

[Series 2: gyn us · 9 of 144 slices shown]
[im 1/144]
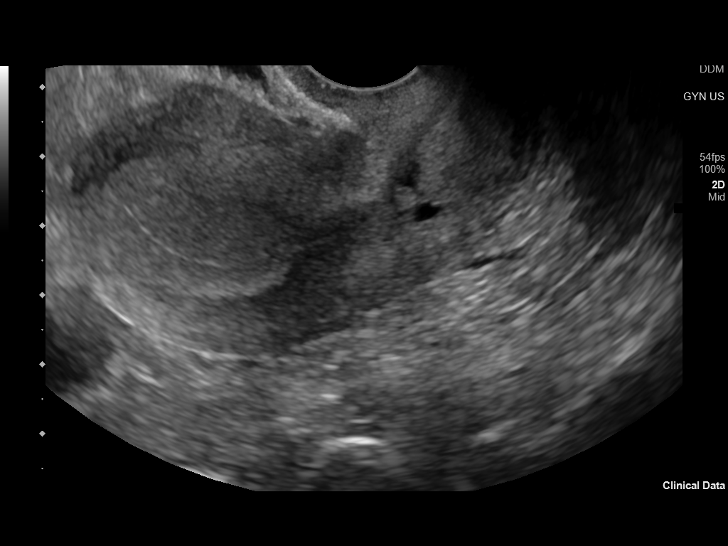
[im 18/144]
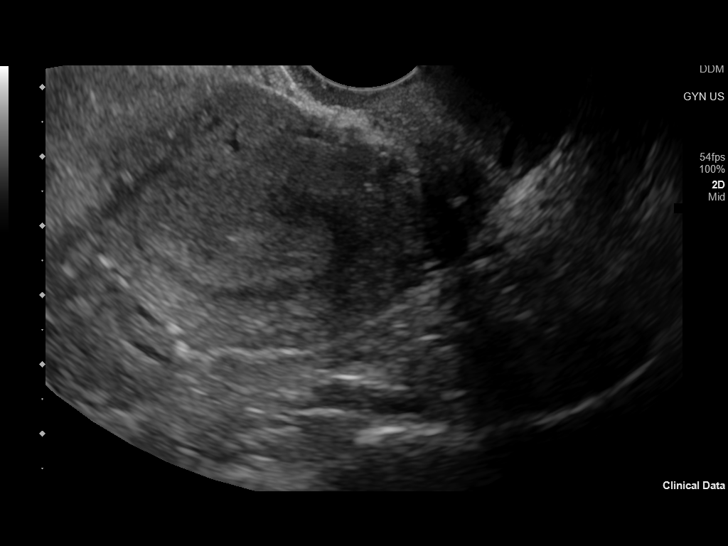
[im 36/144]
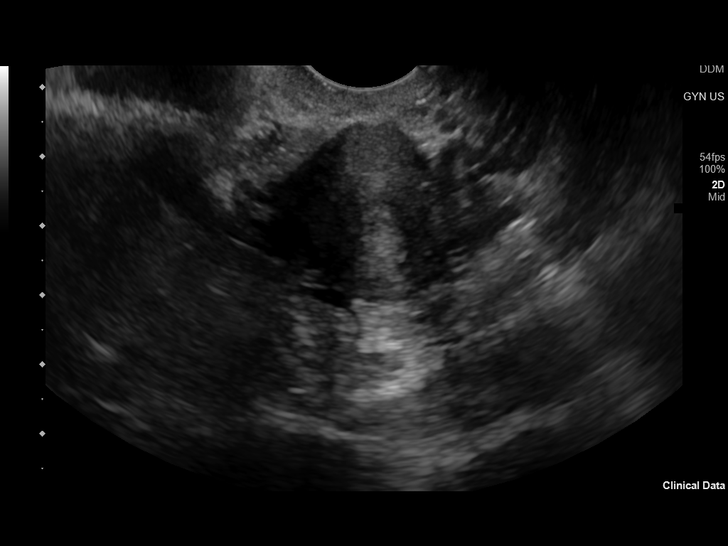
[im 54/144]
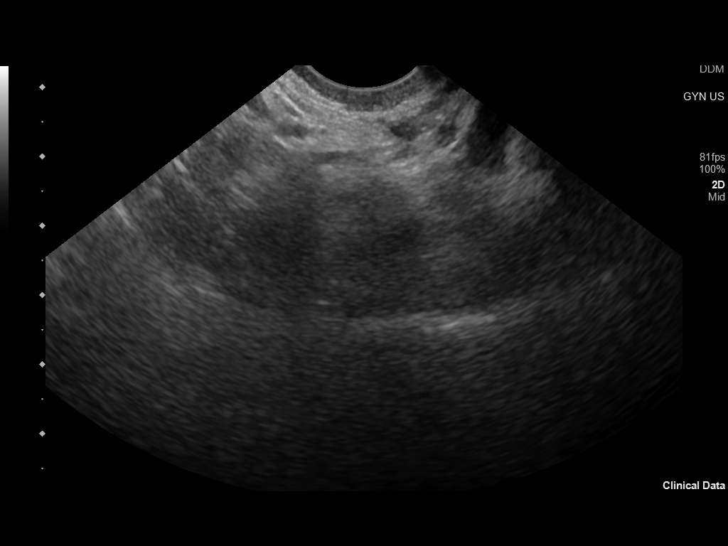
[im 72/144]
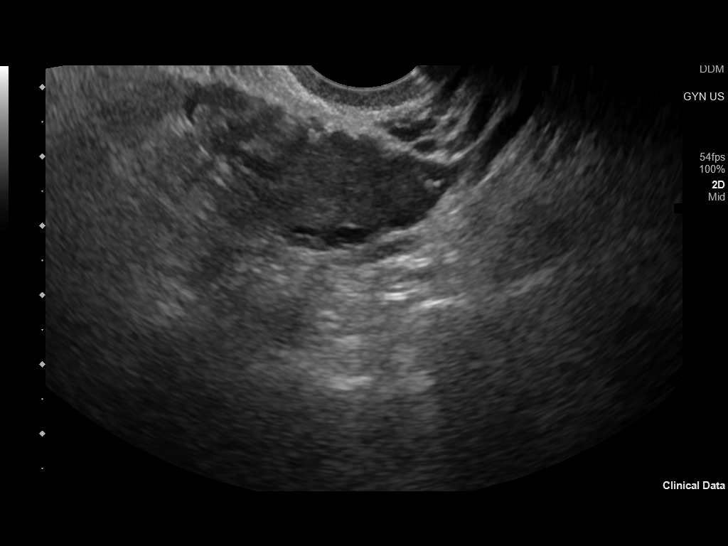
[im 90/144]
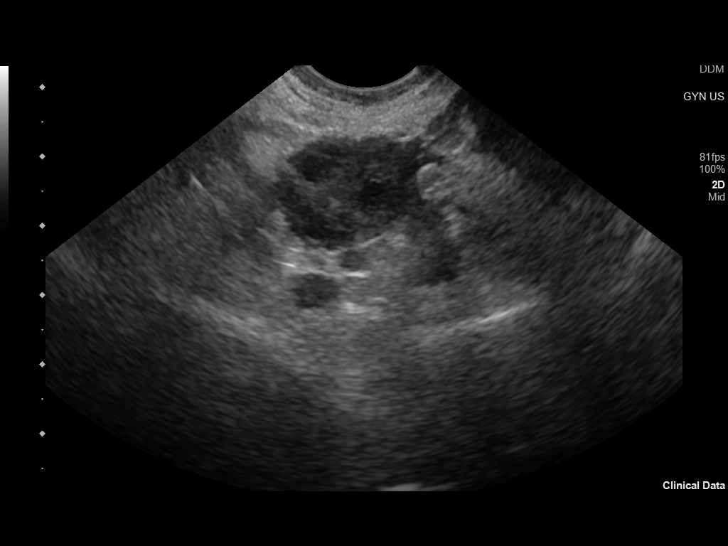
[im 108/144]
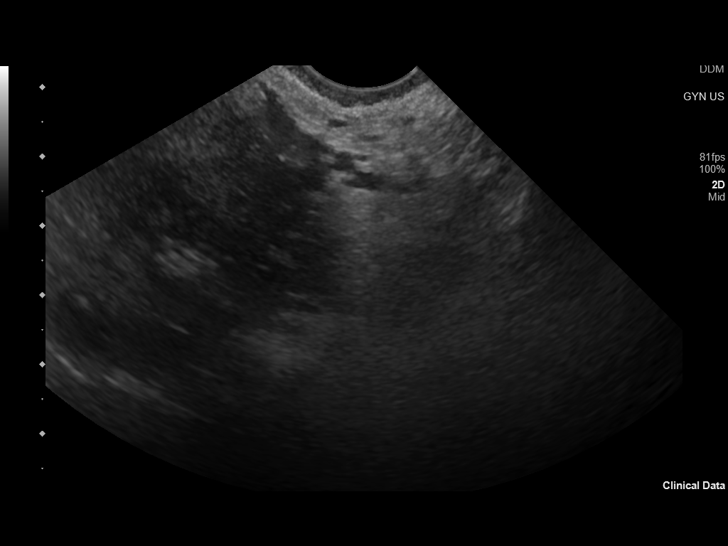
[im 126/144]
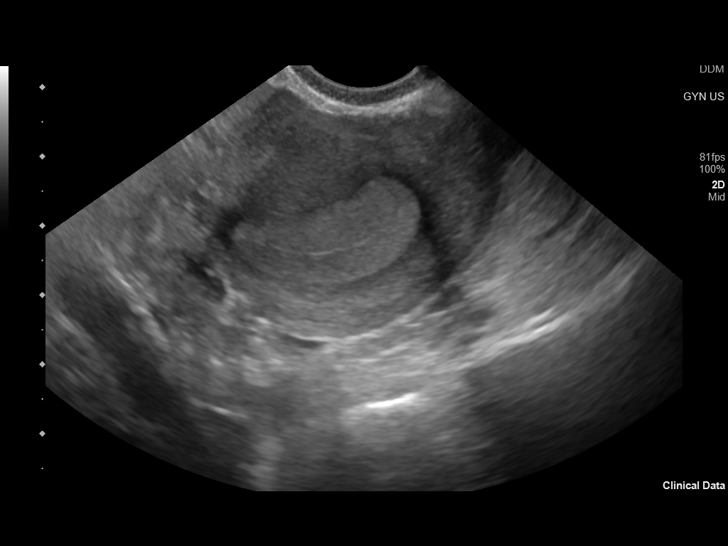
[im 144/144]
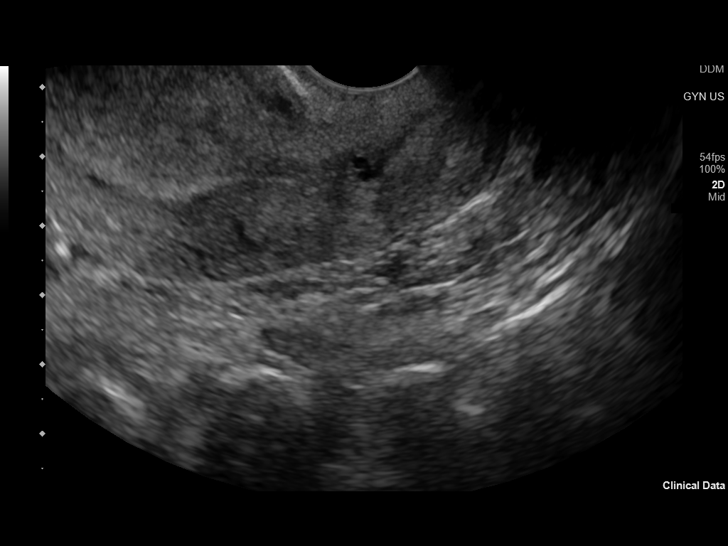

[13 of 25 positions shown; findings below may reference images not displayed]

FINDINGS: Uterus

Measurements: 7 x 3.8 x 4.5 = volume: 62 mL. No fibroids or other
mass visualized.

Endometrium

Thickness: 14 mm, within normal limits given an appearance
compatible with secretory phase. No focal abnormality visualized.

Right ovary

Measurements: 2.9 x 1.7 x 2.2 cm = volume: 5.6 mL. Normal
appearance/no adnexal mass.

Left ovary

Measurements: 2.7 x 2 x 2.6 cm = volume: 7.3 mL. Normal
appearance/no adnexal mass.

Other findings

No abnormal free fluid.
IMPRESSION: Unremarkable pelvic ultrasound.

## 2023-02-01 IMAGING — US US OB < 14 WEEKS - US OB TV
1 series · 14 of 28 positions shown · non-contrast
Comparison: 10/11/2020

CLINICAL DATA: Dating and viability

EXAM:
OBSTETRIC <14 WK US AND TRANSVAGINAL OB US
TECHNIQUE: Both transabdominal and transvaginal ultrasound examinations were
performed for complete evaluation of the gestation as well as the
maternal uterus, adnexal regions, and pelvic cul-de-sac.
Transvaginal technique was performed to assess early pregnancy.

[Series 1: us ob < 14 weeks - us ob tv · 0.09mm/px · 97 acquisitions, 14 frames shown]
[im 4/97]
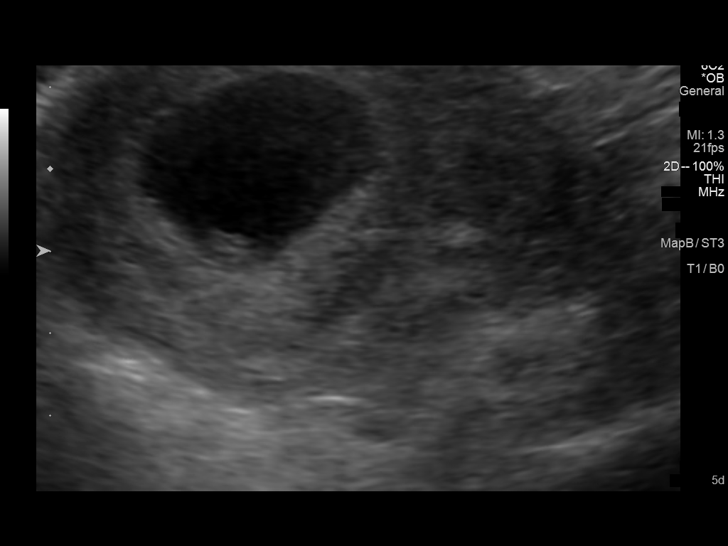
[im 11/97]
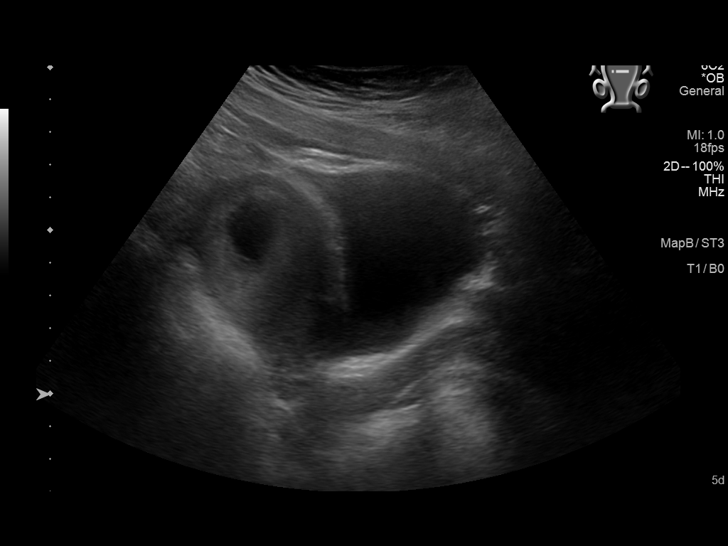
[im 18/97]
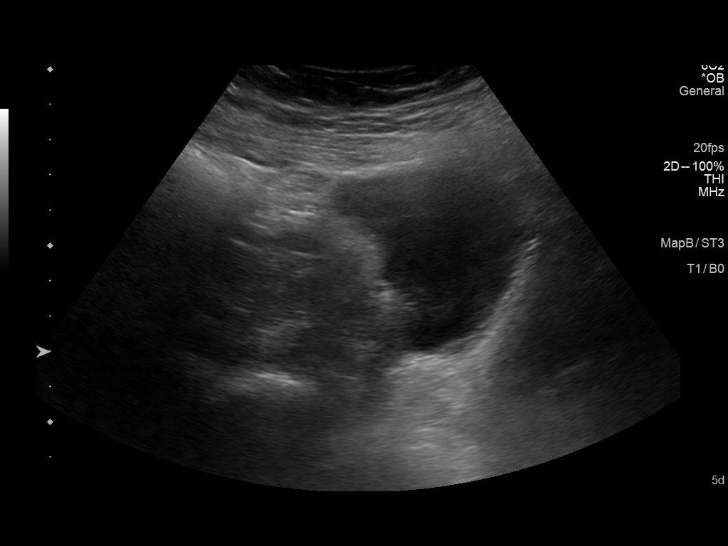
[im 25/97]
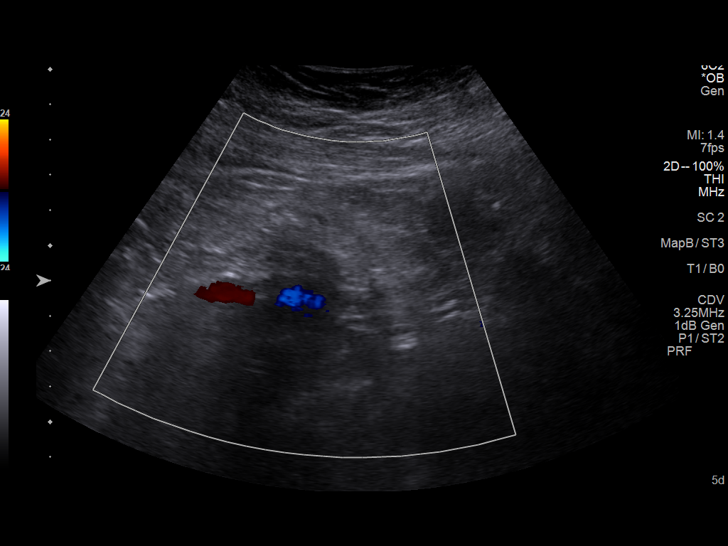
[im 33/97]
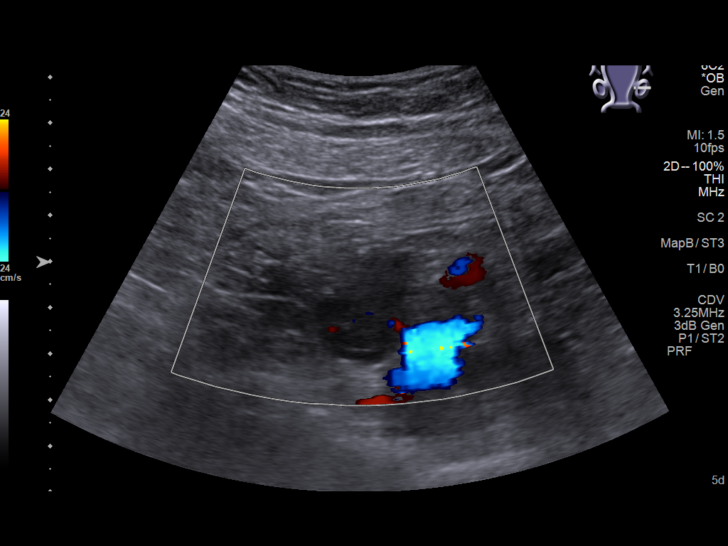
[im 40/97]
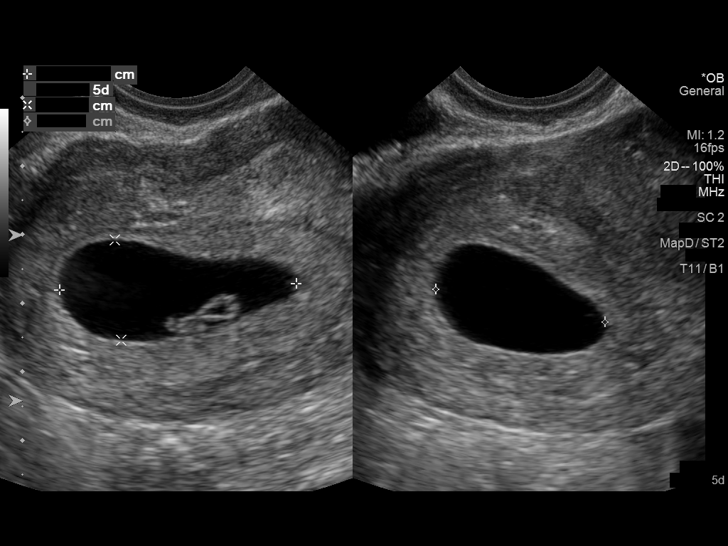
[im 47/97]
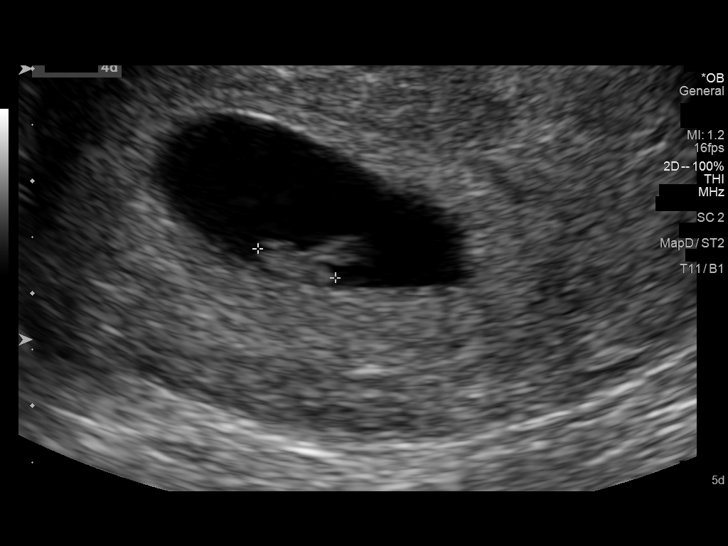
[im 54/97]
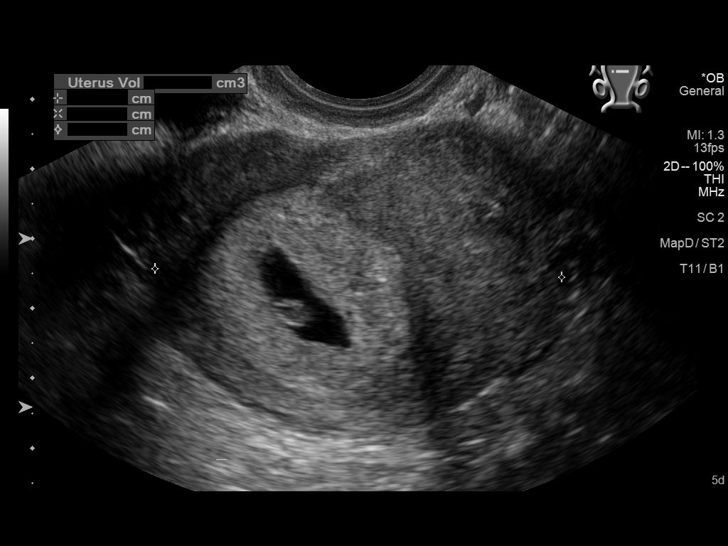
[im 61/97]
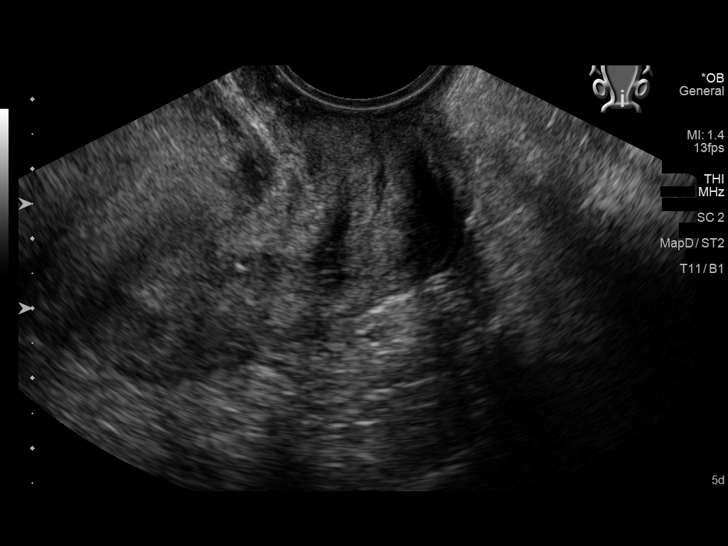
[im 68/97]
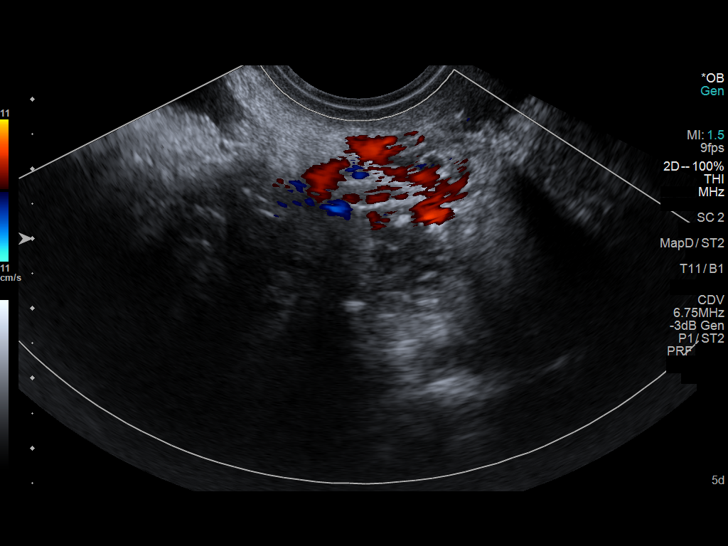
[im 75/97]
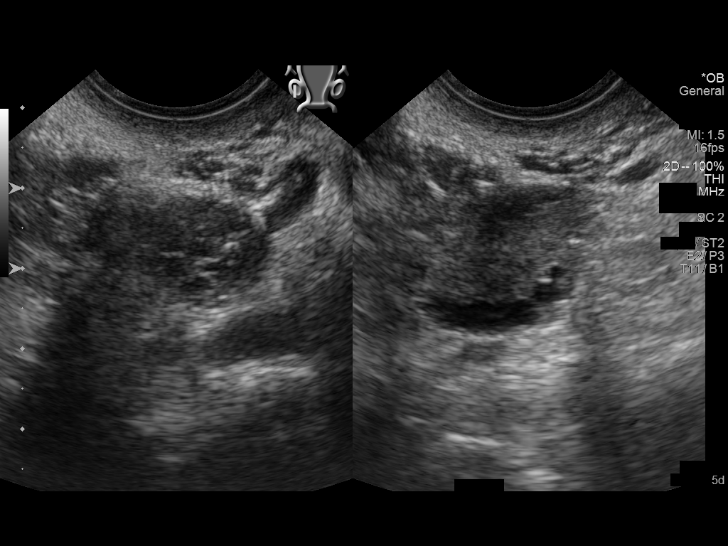
[im 82/97]
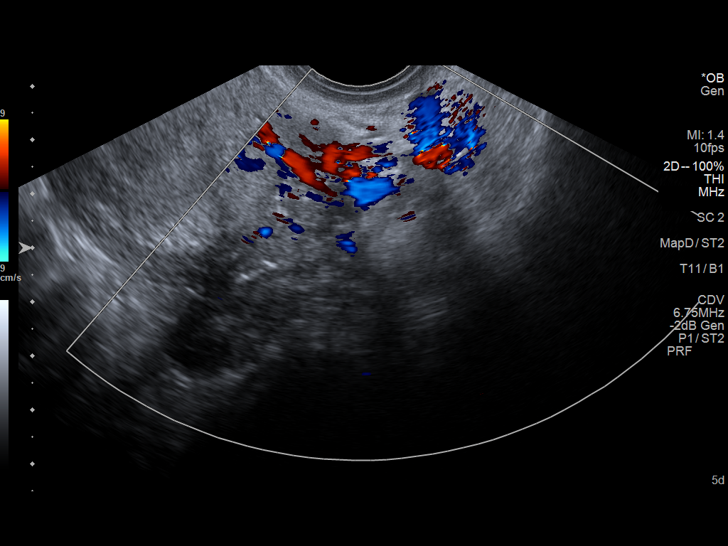
[im 89/97]
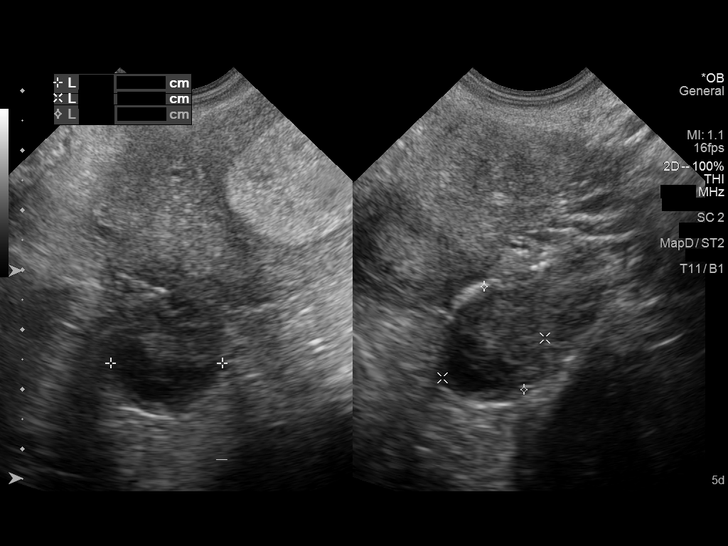
[im 97/97]
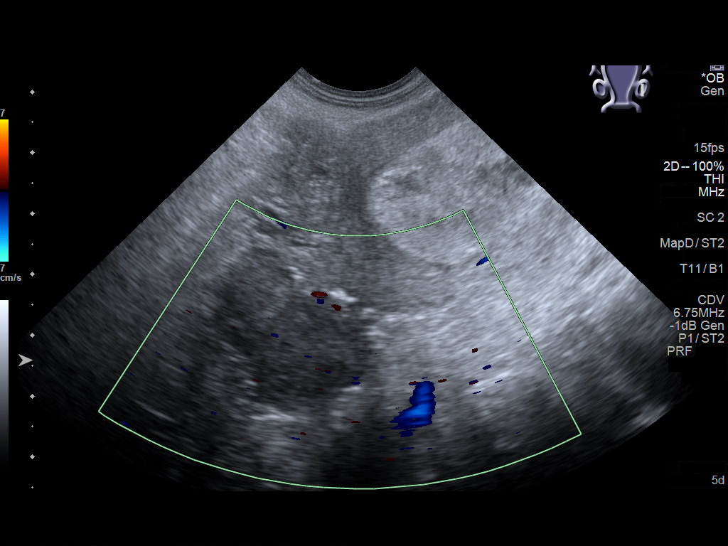

[14 of 28 positions shown; findings below may reference images not displayed]

FINDINGS: Intrauterine gestational sac: Single

Yolk sac:  Visualized.

Embryo:  Visualized.

Cardiac Activity: Visualized.

Heart Rate: 130 bpm

CRL:  0.8 mm   6 w   5 d                  US EDC: 06/23/2021

Subchorionic hemorrhage:  None visualized.

Maternal uterus/adnexae: The right ovary is normal. The left ovary
contains a complex cystic structure measuring 1.9 x 1.8 x 1.9 cm
which is likely a hemorrhagic corpus luteal cyst.
IMPRESSION: Single intrauterine pregnancy with detectable cardiac activity.
Estimated gestational age 6 weeks 5 days. Recommend clinical
follow-up with OBGYN.

## 2023-02-25 ENCOUNTER — Other Ambulatory Visit: Payer: Self-pay | Admitting: Family Medicine

## 2023-02-26 ENCOUNTER — Other Ambulatory Visit: Payer: Self-pay | Admitting: Family Medicine

## 2023-02-26 ENCOUNTER — Emergency Department
Admission: EM | Admit: 2023-02-26 | Discharge: 2023-02-26 | Disposition: A | Discharge status: Home / Self Care | Payer: 59 | Attending: Emergency Medicine | Admitting: Emergency Medicine

## 2023-02-26 ENCOUNTER — Other Ambulatory Visit: Payer: Self-pay

## 2023-02-26 ENCOUNTER — Ambulatory Visit
Admission: EM | Admit: 2023-02-26 | Discharge: 2023-02-26 | Disposition: A | Discharge status: Home / Self Care | Payer: 59 | Attending: Emergency Medicine | Admitting: Emergency Medicine

## 2023-02-26 ENCOUNTER — Encounter: Payer: Self-pay | Admitting: Emergency Medicine

## 2023-02-26 ENCOUNTER — Emergency Department: Payer: 59

## 2023-02-26 DIAGNOSIS — R079 Chest pain, unspecified: Secondary | ICD-10-CM

## 2023-02-26 DIAGNOSIS — R000 Tachycardia, unspecified: Secondary | ICD-10-CM

## 2023-02-26 DIAGNOSIS — R002 Palpitations: Secondary | ICD-10-CM | POA: Diagnosis not present

## 2023-02-26 DIAGNOSIS — R0789 Other chest pain: Secondary | ICD-10-CM | POA: Diagnosis present

## 2023-02-26 DIAGNOSIS — F419 Anxiety disorder, unspecified: Secondary | ICD-10-CM | POA: Diagnosis not present

## 2023-02-26 LAB — BASIC METABOLIC PANEL
Anion gap: 8 (ref 5–15)
BUN: 10 mg/dL (ref 6–20)
CO2: 23 mmol/L (ref 22–32)
Calcium: 9.2 mg/dL (ref 8.9–10.3)
Chloride: 108 mmol/L (ref 98–111)
Creatinine, Ser: 0.48 mg/dL (ref 0.44–1.00)
GFR, Estimated: >60 mL/min (ref >60)
Glucose, Bld: 96 mg/dL (ref 70–99)
Potassium: 3.6 mmol/L (ref 3.5–5.1)
Sodium: 139 mmol/L (ref 135–145)

## 2023-02-26 LAB — POCT URINALYSIS DIP (MANUAL ENTRY)
Bilirubin, UA: NEGATIVE
Glucose, UA: NEGATIVE mg/dL
Ketones, POC UA: NEGATIVE mg/dL
Nitrite, UA: NEGATIVE
Protein Ur, POC: NEGATIVE mg/dL
Spec Grav, UA: 1.02 (ref 1.010–1.025)
Urobilinogen, UA: 0.2 U/dL
pH, UA: 5.5 (ref 5.0–8.0)

## 2023-02-26 LAB — CBC
HCT: 46 % (ref 36.0–46.0)
Hemoglobin: 15.3 g/dL — ABNORMAL HIGH (ref 12.0–15.0)
MCH: 30.4 pg (ref 26.0–34.0)
MCHC: 33.3 g/dL (ref 30.0–36.0)
MCV: 91.3 fL (ref 80.0–100.0)
Platelets: 269 10*3/uL (ref 150–400)
RBC: 5.04 MIL/uL (ref 3.87–5.11)
RDW: 12.6 % (ref 11.5–15.5)
WBC: 7.3 10*3/uL (ref 4.0–10.5)
nRBC: 0 % (ref 0.0–0.2)

## 2023-02-26 LAB — POCT URINE PREGNANCY: Preg Test, Ur: NEGATIVE

## 2023-02-26 LAB — TROPONIN I (HIGH SENSITIVITY): Troponin I (High Sensitivity): <2 ng/L (ref <18)

## 2023-02-26 LAB — TSH: TSH: 2.189 u[IU]/mL (ref 0.350–4.500)

## 2023-02-26 MED ORDER — SODIUM CHLORIDE 0.9 % IV BOLUS
1000.0000 mL | Freq: Once | INTRAVENOUS | Status: AC
  Administered 2023-02-26: 1000 mL via INTRAVENOUS

## 2023-02-26 MED ORDER — VITAMIN D (ERGOCALCIFEROL) 1.25 MG (50000 UNIT) PO CAPS
50000.0000 [IU] | ORAL_CAPSULE | ORAL | 0 refills | Status: DC
  Filled 2023-02-26: qty 5, 35d supply, fill #0

## 2023-02-26 MED FILL — Propranolol HCl Tab 10 MG: ORAL | 90 days supply | Qty: 180 | Fill #0 | Status: AC

## 2023-02-26 NOTE — ED Triage Notes (Signed)
Patient to ED via POV fro UC for chest tightness x2 days. Pt reports having anxiety. Also having dizziness that started this AM. Hx of tachycardia. Tearful in triage.

## 2023-02-26 NOTE — Discharge Instructions (Signed)
Your blood work was normal today.  Your chest x-ray was normal today.  Your EKG shows your heart rate was fast but it is in normal rhythm.  Continue taking your propranolol as prescribed.  You can also take your anxiety medications as needed.  I placed a referral for cardiology, keep an eye out for a phone call regarding this.  Return to the ED for any new or worsening symptoms.

## 2023-02-26 NOTE — ED Triage Notes (Addendum)
Patient to Urgent Care with complaints of lower back pain/ feeling overall unwell. Denies any urinary symptoms at this time. Reports hx of the same and was diagnosed with a UTI.   Symptoms started on Saturday. Denies any known fevers.

## 2023-02-26 NOTE — ED Provider Notes (Signed)
[facility] Provider Note    Event Date/Time   First MD Initiated Contact with Patient 02/26/23 1158     (approximate)   History   Chest Pain   HPI  Elizabeth Berger is a 32 y.o. female with PMH of anxiety, depression and paroxysmal tachycardia presents for evaluation of chest tightness and palpitations for 2 days.  Patient states she felt lightheaded this morning.  She is concerned about having a UTI as this is how she felt the last time she had a UTI.  Patient denies urinary symptoms.  She states that the physiological symptoms she is experiencing induced the anxiety and that it is not the other way around.  Patient was seen by urgent care this morning and was directed to come to the ED for further workup.     Physical Exam   Triage Vital Signs: ED Triage Vitals  Encounter Vitals Group     BP 02/26/23 1140 117/89     Systolic BP Percentile --      Diastolic BP Percentile --      Pulse Rate 02/26/23 1140 (!) 115     Resp 02/26/23 1140 18     Temp 02/26/23 1140 98.2 F (36.8 C)     Temp Source 02/26/23 1140 Oral     SpO2 02/26/23 1140 98 %     Weight 02/26/23 1138 147 lb (66.7 kg)     Height 02/26/23 1138 5\' 2"  (1.575 m)     Head Circumference --      Peak Flow --      Pain Score 02/26/23 1138 1     Pain Loc --      Pain Education --      Exclude from Growth Chart --     Most recent vital signs: Vitals:   02/26/23 1215 02/26/23 1230  BP:  109/80  Pulse: 97 100  Resp: 20 14  Temp:    SpO2: 96% 97%    General: Awake, no distress.  CV:  Good peripheral perfusion.  Tachycardia but regular rhythm. Resp:  Normal effort.  CTAB. Abd:  No distention.  Other:     ED Results / Procedures / Treatments   Labs (all labs ordered are listed, but only abnormal results are displayed) Labs Reviewed  CBC - Abnormal; Notable for the following components:      Result Value   Hemoglobin 15.3 (*)    All other components within normal limits   BASIC METABOLIC PANEL  TSH  POC URINE PREG, ED  TROPONIN I (HIGH SENSITIVITY)  TROPONIN I (HIGH SENSITIVITY)     EKG  ED provider interpretation: Sinus tachycardia with fusion complexes, no ST changes and normal axis.  Vent. rate 114 BPM PR interval 132 ms QRS duration 82 ms QT/QTcB 334/460 ms P-R-T axes 69 87 64   RADIOLOGY  Chest x-ray obtained, I interpreted the images as well as reviewed the radiologist report which is negative for any acute cardiopulmonary abnormalities.   PROCEDURES:  Critical Care performed: No  Procedures   MEDICATIONS ORDERED IN ED: Medications  sodium chloride 0.9 % bolus 1,000 mL (1,000 mLs Intravenous New Bag/Given 02/26/23 1303)     IMPRESSION / MDM / ASSESSMENT AND PLAN / ED COURSE  I reviewed the triage vital signs and the nursing notes.                             32 year old  female presents for evaluation of chest tightness and elevated heart rate for 2 days.  Patient is tachycardic on initial exam otherwise NAD.  Differential diagnosis includes, but is not limited to, anxiety, sinus tachycardia, anemia, dehydration, hyperthyroidism.  Patient's presentation is most consistent with acute complicated illness / injury requiring diagnostic workup.  CBC and BMP unremarkable.  Troponin is not elevated.  TSH WNL.  Chest x-ray obtained, interpreted the images as well as reviewed the radiologist report which was negative for any acute cardiopulmonary abnormalities.  EKG shows sinus tachycardia with fusion complexes.  Given these findings I am not concerned about ACS.  I gave patient a liter of fluids as she was tachycardic on exam to see if this would improve her heart rate.  She also has a prescription for propranolol so I advised her to take this medication.  Upon reassessment after completing the fluids patient's heart rate was 88.  I advised patient to continue taking the propranolol as prescribed at home and I will also give her a  referral for cardiology.  She voiced understanding, all questions were answered and she is stable at discharge.     FINAL CLINICAL IMPRESSION(S) / ED DIAGNOSES   Final diagnoses:  Sinus tachycardia     Rx / DC Orders   ED Discharge Orders          Ordered    Ambulatory referral to Cardiology       Comments: If you have not heard from the Cardiology office within the next 72 hours please call [phone].   02/26/23 1424             Note:  This document was prepared using Dragon voice recognition software and may include unintentional dictation errors.   Cameron Ali, PA-C 02/26/23 1426    Chesley Noon, MD 02/26/23 [phone]

## 2023-02-26 NOTE — ED Notes (Signed)
ED Provider at bedside. 

## 2023-02-26 NOTE — ED Provider Notes (Signed)
Elizabeth Berger    CSN: [id number] Arrival date & time: 02/26/23  1039      History   Chief Complaint Chief Complaint  Patient presents with   Dysuria    HPI Elizabeth Berger is a 32 y.o. female.  Patient presents with 2-day history of anxiety, heart palpitations, fatigue, dizziness.  She states she feels "like her heart is beating too fast" and pressure in her chest.  Her symptoms are worse today.  She is concerned that this may be a UTI because she had similar symptoms with a UTI in the past.  She also reports having tachycardia in the past when she was pregnant.  She denies weakness, numbness, fever, chills, cough, shortness of breath, nausea, vomiting, abdominal pain, dysuria, hematuria, or other symptoms.  Her medical history includes paroxysmal tachycardia, anxiety.  The history is provided by the patient and medical records.    Past Medical History:  Diagnosis Date   Anxiety    Depression    FH: migraines    mom   Herpes    1&2 IgG+   Migraines     Patient Active Problem List   Diagnosis Date Noted   Stress and adjustment reaction 12/15/2022   Periodic headache syndrome, not intractable 12/15/2022   Transient neurological symptoms 12/15/2022   Paroxysmal tachycardia (HCC) 10/12/2022   Generalized anxiety disorder with panic attacks 10/12/2022   Urgency of urination 07/31/2022   Avitaminosis D 07/31/2022   Annual physical exam 04/18/2019    Past Surgical History:  Procedure Laterality Date   BREAST BIOPSY Left 12/18/2022   [place] bx mass, heart marker, path pending   BREAST BIOPSY Left 12/18/2022   [place] LT BREAST BX W LOC DEV 1ST LESION IMG BX SPEC US GUIDE 12/18/2022 [facility]   TONSILLECTOMY      OB History     Gravida  1   Para  1   Term  1   Preterm  0   AB  0   Living  1      SAB  0   IAB  0   Ectopic  0   Multiple  0   Live Births  1            Home Medications    Prior to Admission medications   Medication  Sig Start Date End Date Taking? Authorizing Provider  hydrOXYzine (ATARAX) 10 MG tablet Take 1 tablet (10 mg total) by mouth every 6 (six) hours as needed for anxiety (sleep). 10/12/22   Jacky Kindle, FNP  nitrofurantoin, macrocrystal-monohydrate, (MACROBID) 100 MG capsule Take 1 capsule (100 mg total) by mouth 2 (two) times daily. Patient not taking: Reported on 02/26/2023 12/15/22   Jacky Kindle, FNP  norethindrone (INCASSIA) 0.35 MG tablet Take 1 tablet (0.35 mg total) by mouth daily. Patient not taking: Reported on 02/26/2023 11/15/22   Hildred Laser, MD  prochlorperazine (COMPAZINE) 10 MG tablet Take 1 tablet (10 mg total) by mouth every 6 (six) hours as needed for nausea or vomiting. 12/15/22   Jacky Kindle, FNP  propranolol (INDERAL) 10 MG tablet Take 1 tablet (10 mg total) by mouth 2 (two) times daily. 02/26/23   Jacky Kindle, FNP  venlafaxine XR (EFFEXOR XR) 75 MG 24 hr capsule Take 1 capsule (75 mg total) by mouth at bedtime. 10/12/22   Jacky Kindle, FNP  Vitamin D, Ergocalciferol, (DRISDOL) 1.25 MG (50000 UNIT) CAPS capsule Take 1 capsule (50,000 Units total) by mouth every  7 (seven) days. 12/15/22   Jacky Kindle, FNP    Family History Family History  Problem Relation Age of Onset   Arthritis Mother    Depression Mother    Cervical cancer Mother    Depression Maternal Grandmother    Cancer Maternal Grandmother 4       Breast   Cancer Maternal Grandfather        ? type ?bone? liver vs kidney   Hearing loss Maternal Grandfather     Social History Social History   Tobacco Use   Smoking status: Former   Smokeless tobacco: Never   Tobacco comments:    social smoker   Vaping Use   Vaping status: Never Used  Substance Use Topics   Alcohol use: Not Currently   Drug use: Not Currently     Allergies   Patient has no known allergies.   Review of Systems Review of Systems  Constitutional:  Positive for fatigue. Negative for chills and fever.  Respiratory:   Negative for cough and shortness of breath.   Cardiovascular:  Positive for chest pain and palpitations.  Gastrointestinal:  Negative for abdominal pain, diarrhea and vomiting.  Genitourinary:  Negative for dysuria, flank pain and hematuria.  Neurological:  Positive for dizziness. Negative for syncope, weakness and numbness.  Psychiatric/Behavioral:  The patient is nervous/anxious.      Physical Exam Triage Vital Signs ED Triage Vitals  Encounter Vitals Group     BP      Systolic BP Percentile      Diastolic BP Percentile      Pulse      Resp      Temp      Temp src      SpO2      Weight      Height      Head Circumference      Peak Flow      Pain Score      Pain Loc      Pain Education      Exclude from Growth Chart    No data found.  Updated Vital Signs BP 117/84   Pulse (!) 111   Temp 98.2 F (36.8 C)   Resp 18   Ht 5\' 2"  (1.575 m)   Wt 147 lb (66.7 kg)   LMP 02/12/2023   SpO2 98%   BMI 26.89 kg/m   Visual Acuity Right Eye Distance:   Left Eye Distance:   Bilateral Distance:    Right Eye Near:   Left Eye Near:    Bilateral Near:     Physical Exam Vitals and nursing note reviewed.  Constitutional:      General: She is not in acute distress.    Appearance: She is well-developed.  HENT:     Mouth/Throat:     Mouth: Mucous membranes are moist.  Cardiovascular:     Rate and Rhythm: Regular rhythm. Tachycardia present.     Heart sounds: Normal heart sounds.  Pulmonary:     Effort: Pulmonary effort is normal. No respiratory distress.     Breath sounds: Normal breath sounds.  Abdominal:     General: Bowel sounds are normal.     Palpations: Abdomen is soft.     Tenderness: There is no abdominal tenderness. There is no right CVA tenderness, left CVA tenderness, guarding or rebound.  Musculoskeletal:     Cervical back: Neck supple.  Skin:    General: Skin is warm and dry.  Neurological:  Mental Status: She is alert.  Psychiatric:        Mood  and Affect: Affect is tearful.        Behavior: Behavior normal.        Thought Content: Thought content normal.        Judgment: Judgment normal.      UC Treatments / Results  Labs (all labs ordered are listed, but only abnormal results are displayed) Labs Reviewed  POCT URINALYSIS DIP (MANUAL ENTRY) - Abnormal; Notable for the following components:      Result Value   Clarity, UA turbid (*)    Blood, UA trace-intact (*)    Leukocytes, UA Small (1+) (*)    All other components within normal limits  POCT URINE PREGNANCY    EKG   Radiology No results found.  Procedures Procedures (including critical care time)  Medications Ordered in UC Medications - No data to display  Initial Impression / Assessment and Plan / UC Course  I have reviewed the triage vital signs and the nursing notes.  Pertinent labs & imaging results that were available during my care of the patient were reviewed by me and considered in my medical decision making (see chart for details).    Chest pain, heart palpitations, tachycardia, anxiety.  EKG shows sinus tachycardia, rate 117, no ST elevation, compared to previous from 10/10/2022.  Sending patient to the emergency department for evaluation of her chest pain, palpitations, tachycardia.  She is agreeable to this.  She declines EMS and feels stable to drive herself to [facility] ED.  Final Clinical Impressions(s) / UC Diagnoses   Final diagnoses:  Chest pain, unspecified type  Heart palpitations  Tachycardia  Anxiety     Discharge Instructions      Go to the emergency department for evaluation of your chest pain, palpitations, elevated heart rate.     ED Prescriptions   None    PDMP not reviewed this encounter.   Mickie Bail, NP 02/26/23 1124

## 2023-02-26 NOTE — Discharge Instructions (Signed)
Go to the emergency department for evaluation of your chest pain, palpitations, elevated heart rate.

## 2023-02-27 ENCOUNTER — Other Ambulatory Visit: Payer: Self-pay

## 2023-03-11 NOTE — Progress Notes (Unsigned)
Cardiology Clinic Note   Date: 03/15/2023 ID: Tasa Sadd, DOB 01/14/1991, MRN [id number]  Primary Cardiologist:  Julien Nordmann, MD  Patient Profile    Elizabeth Berger is a 32 y.o. female who presents to the clinic today for hospital follow up.     Past medical history significant for: Chest pain/dyspnea. Echo 06/13/2021: EF 60 to 65%.  No RWMA.  Normal diastolic parameters.  Normal RV size/function.  No significant valvular abnormalities. Palpitations/paroxysmal tachycardia. GAD.  In summary, patient was first evaluated by Dr. Mariah Milling on 05/19/2021 for palpitations, dyspnea, chest pain at the request of OB/GYN.  She was [redacted] weeks pregnant at that time.  Patient reported prior to pregnancy she had occasional sensation of heart skipping but more recently noted persistent tachycardia.  She also reported a recent episode of near syncope describing getting hot doing home activities and then developed severe abdominal cramping like she needed to have a bowel movement.  She became nauseated, diaphoretic and dizzy.  She laid down on the floor and boyfriend gave her some water which resolved her symptoms.  She had no further episodes.  EKG at the time of her visit showed sinus tachycardia 134 bpm.  Orthostatic vitals did not reveal orthostasis.  She was started on propranolol for symptomatic tachycardia.  Echo was normal (details above).     History of Present Illness    Elizabeth Berger is followed by Dr. Mariah Milling for the above outlined history.  Patient was last seen in the office on 06/17/2021 for follow-up.  She was 6 days from delivering her baby.  She was stable from a cardiac standpoint and no further testing was planned.  Presented to urgent care on 02/26/2023 with complaints of palpitations, anxiety, fatigue, dizziness.  She was concerned she may have a UTI as she had had similar symptoms in the past in the setting of UTI.  EKG showed sinus tach 117 bpm with no acute changes.   It was felt that she should be further evaluated in the ED and she declined EMS transport in favor of getting there herself.  Initial ED labs: Hemoglobin 15.3, potassium 3.6, creatinine 0.48, TSH 2.189.  Troponin negative.  Chest x-ray negative for acute process.  Patient was given fluids and her heart rate improved to 88 bpm.  Discussed the use of AI scribe software for clinical note transcription with the patient, who gave verbal consent to proceed.  The patient presents for hospital follow-up. She reports daily episodes of palpitations described as feeling like her heart is racing/pounding with associated dyspnea and chest tightness. She checks her heart rate on a pulse ox when she is symptomatic, and it is typically in the 110s-120s.  She does not have dyspnea or chest tightness any other time.  The palpitations are unchanged from when they started during pregnancy.  The episodes are most severe in the morning, often waking the patient, and last for one to two hours. The patient has been taking propranolol as needed, usually once daily, for symptom relief. She recently started exercising and notes that she will have to take multiple breaks during cardio workouts. She describes the sensation as feeling like she "ran a mile" and has to "catch up."  However, she also experiences this sensation at rest, such as sitting at work.        ROS: All other systems reviewed and are otherwise negative except as noted in History of Present Illness.  Studies Reviewed    EKG Interpretation Date/Time:  Thursday March 15 2023 08:56:46 EST Ventricular Rate:  85 PR Interval:  132 QRS Duration:  82 QT Interval:  380 QTC Calculation: 452 R Axis:   46  Text Interpretation: Normal sinus rhythm Low voltage QRS When compared with ECG of 26-Feb-2023 11:39, Fusion complexes are no longer Present Confirmed by Carlos Levering [phone]) on 03/15/2023 9:01:41 AM           Physical Exam    VS:  BP 100/62 (BP  Location: Right Arm, Patient Position: Sitting, Cuff Size: Normal)   Pulse 85   Ht 5\' 2"  (1.575 m)   Wt 147 lb 6.4 oz (66.9 kg)   LMP 02/12/2023   SpO2 99%   BMI 26.96 kg/m  , BMI Body mass index is 26.96 kg/m.  GEN: Well nourished, well developed, in no acute distress. Neck: No JVD or carotid bruits. Cardiac:  RRR. No murmurs. No rubs or gallops.   Respiratory:  Respirations regular and unlabored. Clear to auscultation without rales, wheezing or rhonchi. GI: Soft, nontender, nondistended. Extremities: Radials/DP/PT 2+ and equal bilaterally. No clubbing or cyanosis. No edema.  Skin: Warm and dry, no rash. Neuro: Strength intact.  Assessment & Plan      Palpitations and Dyspnea History of tachycardia since pregnancy in 2023. Daily episodes of increased heart rate with associated shortness of breath, worse in the morning. No associated chest pain. Normal echocardiogram in February 2023. Currently on as-needed Propranolol with some relief. -Order 7-day Zio. -Increase Propranolol to a regular bedtime dose with up to two additional doses as needed for breakthrough symptoms during the day. -Encouraged good hydration.      Disposition: Increase propranolol 10 mg 3 times a day.  Return in 3 months or sooner as needed.          Signed, Etta Grandchild. Misbah Hornaday, DNP, NP-C

## 2023-03-12 ENCOUNTER — Ambulatory Visit: Payer: 59 | Admitting: Student

## 2023-03-15 ENCOUNTER — Ambulatory Visit: Payer: 59 | Attending: Student | Admitting: Student

## 2023-03-15 ENCOUNTER — Ambulatory Visit: Payer: 59

## 2023-03-15 ENCOUNTER — Encounter: Payer: Self-pay | Admitting: Student

## 2023-03-15 ENCOUNTER — Other Ambulatory Visit: Payer: Self-pay

## 2023-03-15 VITALS — BP 100/62 | HR 85 | Ht 62.0 in | Wt 147.4 lb

## 2023-03-15 DIAGNOSIS — R002 Palpitations: Secondary | ICD-10-CM

## 2023-03-15 DIAGNOSIS — R0602 Shortness of breath: Secondary | ICD-10-CM | POA: Diagnosis not present

## 2023-03-15 MED ORDER — PROPRANOLOL HCL 10 MG PO TABS
10.0000 mg | ORAL_TABLET | Freq: Three times a day (TID) | ORAL | 3 refills | Status: DC
  Filled 2023-03-15 – 2023-06-18 (×2): qty 270, 90d supply, fill #0
  Filled 2023-11-26: qty 270, 90d supply, fill #1
  Filled 2024-03-10: qty 270, 90d supply, fill #2

## 2023-03-15 NOTE — Patient Instructions (Signed)
Medication Instructions:  Your physician recommends the following medication changes.   INCREASE: Propanolol 10 mg 3 times daily   *If you need a refill on your cardiac medications before your next appointment, please call your pharmacy*   Lab Work: None  Testing/Procedures: Christena Deem- Long Term Monitor Instructions  Your physician has requested you wear a ZIO patch monitor for 14 days.  This is a single patch monitor. [employer] supplies one patch monitor per enrollment. Additional stickers are not available. Please do not apply patch if you will be having a Nuclear Stress Test, Echocardiogram, Cardiac CT, MRI, or Chest Xray during the period you would be wearing the monitor. The patch cannot be worn during these tests. You cannot remove and re-apply the ZIO XT patch monitor.  Your ZIO patch monitor will be mailed 3 day USPS to your address on file. It may take 3-5 days to receive your monitor after you have been enrolled.  Once you have received your monitor, please review the enclosed instructions. Your monitor has already been registered assigning a specific monitor serial # to you.  Billing and Patient Assistance Program Information  We have supplied [employer] with any of your insurance information on file for billing purposes. [employer] offers a sliding scale Patient Assistance Program for patients that do not have insurance, or whose insurance does not completely cover the cost of the ZIO monitor.  You must apply for the Patient Assistance Program to qualify for this discounted rate.  To apply, please call [employer] at [phone], select option 4, select option 2, ask to apply for Patient Assistance Program. Meredeth Ide will ask your household income, and how many people are in your household. They will quote your out-of-pocket cost based on that information.  [employer] will also be able to set up a 50-month, interest-free payment plan if needed.  Applying the monitor   Shave hair from upper  left chest.  Hold abrader disc by orange tab. Rub abrader in 40 strokes over the upper left chest as indicated in your monitor instructions.  Clean area with 4 enclosed alcohol pads. Let dry.  Apply patch as indicated in monitor instructions. Patch will be placed under collarbone on left side of chest with arrow pointing upward.  Rub patch adhesive wings for 2 minutes. Remove white label marked "1". Remove the white label marked "2". Rub patch adhesive wings for 2 additional minutes.  While looking in a mirror, press and release button in center of patch. A small green light will flash 3-4 times. This will be your only indicator that the monitor has been turned on.  Do not shower for the first 24 hours. You may shower after the first 24 hours.  Press the button if you feel a symptom. You will hear a small click. Record Date, Time and Symptom in the Patient Logbook.  When you are ready to remove the patch, follow instructions on the last 2 pages of Patient Logbook. Stick patch monitor onto the last page of Patient Logbook.  Place Patient Logbook in the blue and white box. Use locking tab on box and tape box closed securely. The blue and white box has prepaid postage on it. Please place it in the mailbox as soon as possible. Your physician should have your test results approximately 7 days after the monitor has been mailed back to [facility]  Call [facility] Customer Care at [phone] if you have questions regarding your ZIO XT patch monitor. Call them immediately if you see an  orange light blinking on your monitor.  If your monitor falls off in less than 4 days, contact our Monitor department at [phone].  If your monitor becomes loose or falls off after 4 days call [employer] at [phone] for suggestions on securing your monitor    Follow-Up: At [facility], you and your health needs are our priority.  As part of our continuing mission to provide you with exceptional  heart care, we have created designated Provider Care Teams.  These Care Teams include your primary Cardiologist (physician) and Advanced Practice Providers (APPs -  Physician Assistants and Nurse Practitioners) who all work together to provide you with the care you need, when you need it.  We recommend signing up for the patient portal called "MyChart".  Sign up information is provided on this After Visit Summary.  MyChart is used to connect with patients for Virtual Visits (Telemedicine).  Patients are able to view lab/test results, encounter notes, upcoming appointments, etc.  Non-urgent messages can be sent to your provider as well.   To learn more about what you can do with MyChart, go to [email or web address].    Your next appointment:   3 month(s)  Provider:   You may see Julien Nordmann, MD or one of the following Advanced Practice Providers on your designated Care Team:   Nicolasa Ducking, NP Eula Listen, PA-C Cadence Fransico Michael, PA-C Charlsie Quest, NP Carlos Levering, NP

## 2023-03-20 DIAGNOSIS — R002 Palpitations: Secondary | ICD-10-CM | POA: Diagnosis not present

## 2023-04-25 DIAGNOSIS — R002 Palpitations: Secondary | ICD-10-CM | POA: Diagnosis not present

## 2023-04-26 ENCOUNTER — Ambulatory Visit
Admission: EM | Admit: 2023-04-26 | Discharge: 2023-04-26 | Disposition: A | Discharge status: Home / Self Care | Payer: 59 | Attending: Physician Assistant | Admitting: Physician Assistant

## 2023-04-26 DIAGNOSIS — J029 Acute pharyngitis, unspecified: Secondary | ICD-10-CM | POA: Insufficient documentation

## 2023-04-26 LAB — GROUP A STREP BY PCR: Group A Strep by PCR: NOT DETECTED

## 2023-04-26 NOTE — ED Triage Notes (Signed)
Sx x 3 days  Sore throat. No fever. No congestion or ear pain

## 2023-04-26 NOTE — ED Provider Notes (Signed)
[facility]    CSN: [id number] Arrival date & time: 04/26/23  0911      History   Chief Complaint Chief Complaint  Patient presents with   Sore Throat    HPI Elizabeth Berger is a 32 y.o. female presenting for 3 to 4-day history of sore throat and fatigue.  Denies fever, cough, ear pain, congestion, sinus issues, breathing problem.  Symptoms better when she "has something coating my throat."  She does fine with eating and drinking.  Symptoms worse at night.  Denies any sick contacts.  HPI  Past Medical History:  Diagnosis Date   Anxiety    Depression    FH: migraines    mom   Herpes    1&2 IgG+   Migraines     Patient Active Problem List   Diagnosis Date Noted   Stress and adjustment reaction 12/15/2022   Periodic headache syndrome, not intractable 12/15/2022   Transient neurological symptoms 12/15/2022   Paroxysmal tachycardia (HCC) 10/12/2022   Generalized anxiety disorder with panic attacks 10/12/2022   Urgency of urination 07/31/2022   Avitaminosis D 07/31/2022   Annual physical exam 04/18/2019    Past Surgical History:  Procedure Laterality Date   BREAST BIOPSY Left 12/18/2022   [place] bx mass, heart marker, path pending   BREAST BIOPSY Left 12/18/2022   [place] LT BREAST BX W LOC DEV 1ST LESION IMG BX SPEC US GUIDE 12/18/2022 [facility]   TONSILLECTOMY      OB History     Gravida  1   Para  1   Term  1   Preterm  0   AB  0   Living  1      SAB  0   IAB  0   Ectopic  0   Multiple  0   Live Births  1            Home Medications    Prior to Admission medications   Medication Sig Start Date End Date Taking? Authorizing Provider  hydrOXYzine (ATARAX) 10 MG tablet Take 1 tablet (10 mg total) by mouth every 6 (six) hours as needed for anxiety (sleep). 10/12/22  Yes Jacky Kindle, FNP  nitrofurantoin, macrocrystal-monohydrate, (MACROBID) 100 MG capsule Take 1 capsule (100 mg total) by mouth 2 (two) times daily. 12/15/22    Jacky Kindle, FNP  norethindrone (INCASSIA) 0.35 MG tablet Take 1 tablet (0.35 mg total) by mouth daily. Patient not taking: Reported on 02/26/2023 11/15/22   Hildred Laser, MD  prochlorperazine (COMPAZINE) 10 MG tablet Take 1 tablet (10 mg total) by mouth every 6 (six) hours as needed for nausea or vomiting. 12/15/22   Jacky Kindle, FNP  propranolol (INDERAL) 10 MG tablet Take 1 tablet (10 mg total) by mouth 3 (three) times daily. 03/15/23  Yes Wittenborn, Gavin Pound, NP  venlafaxine XR (EFFEXOR XR) 75 MG 24 hr capsule Take 1 capsule (75 mg total) by mouth at bedtime. 10/12/22  Yes Merita Norton T, FNP  Vitamin D, Ergocalciferol, (DRISDOL) 1.25 MG (50000 UNIT) CAPS capsule Take 1 capsule (50,000 Units total) by mouth every 7 (seven) days. 02/26/23  Yes Jacky Kindle, FNP    Family History Family History  Problem Relation Age of Onset   Arthritis Mother    Depression Mother    Cervical cancer Mother    Depression Maternal Grandmother    Cancer Maternal Grandmother 71       Breast   Cancer Maternal Grandfather        ?  type ?bone? liver vs kidney   Hearing loss Maternal Grandfather     Social History Social History   Tobacco Use   Smoking status: Former   Smokeless tobacco: Never   Tobacco comments:    social smoker   Vaping Use   Vaping status: Never Used  Substance Use Topics   Alcohol use: Not Currently   Drug use: Not Currently     Allergies   Patient has no known allergies.   Review of Systems Review of Systems  Constitutional:  Positive for fatigue. Negative for chills, diaphoresis and fever.  HENT:  Positive for sore throat. Negative for congestion, ear pain, rhinorrhea, sinus pressure and sinus pain.   Respiratory:  Negative for cough and shortness of breath.   Gastrointestinal:  Negative for abdominal pain, nausea and vomiting.  Musculoskeletal:  Negative for arthralgias and myalgias.  Skin:  Negative for rash.  Neurological:  Negative for weakness and  headaches.  Hematological:  Negative for adenopathy.     Physical Exam Triage Vital Signs ED Triage Vitals  Encounter Vitals Group     BP      Systolic BP Percentile      Diastolic BP Percentile      Pulse      Resp      Temp      Temp src      SpO2      Weight      Height      Head Circumference      Peak Flow      Pain Score      Pain Loc      Pain Education      Exclude from Growth Chart    No data found.  Updated Vital Signs BP 120/72 (BP Location: Right Arm)   Pulse 85   Temp 98.2 F (36.8 C) (Oral)   Resp 19   LMP 04/10/2023 (Approximate)   SpO2 98%     Physical Exam Vitals and nursing note reviewed.  Constitutional:      General: She is not in acute distress.    Appearance: Normal appearance. She is not ill-appearing or toxic-appearing.  HENT:     Head: Normocephalic and atraumatic.     Nose: Nose normal.     Mouth/Throat:     Mouth: Mucous membranes are moist.     Pharynx: Oropharynx is clear. Posterior oropharyngeal erythema present.  Eyes:     General: No scleral icterus.       Right eye: No discharge.        Left eye: No discharge.     Conjunctiva/sclera: Conjunctivae normal.  Cardiovascular:     Rate and Rhythm: Normal rate and regular rhythm.     Heart sounds: Normal heart sounds.  Pulmonary:     Effort: Pulmonary effort is normal. No respiratory distress.     Breath sounds: Normal breath sounds.  Musculoskeletal:     Cervical back: Neck supple.  Skin:    General: Skin is dry.  Neurological:     General: No focal deficit present.     Mental Status: She is alert. Mental status is at baseline.     Motor: No weakness.     Gait: Gait normal.  Psychiatric:        Mood and Affect: Mood normal.        Behavior: Behavior normal.      UC Treatments / Results  Labs (all labs ordered are listed, but only abnormal results  are displayed) Labs Reviewed  GROUP A STREP BY PCR    EKG   Radiology No results  found.  Procedures Procedures (including critical care time)  Medications Ordered in UC Medications - No data to display  Initial Impression / Assessment and Plan / UC Course  I have reviewed the triage vital signs and the nursing notes.  Pertinent labs & imaging results that were available during my care of the patient were reviewed by me and considered in my medical decision making (see chart for details).   32 year old female presents for sore throat for 3 to 4 days.  Denies fever, cough, congestion.  Vitals are normal and stable and she is overall well-appearing.  She has very mild erythema posterior pharynx.  Remainder of exam is normal.  PCR strep obtained.  Negative.  Discussed results with patient.  Acute viral pharyngitis.  Supportive care encouraged with increasing rest and fluids and use of OTC meds.  Reviewed return precautions.    Final Clinical Impressions(s) / UC Diagnoses   Final diagnoses:  Viral pharyngitis   Discharge Instructions   None    ED Prescriptions   None    PDMP not reviewed this encounter.   Shirlee Latch, PA-C 04/26/23 1007

## 2023-05-03 ENCOUNTER — Ambulatory Visit: Payer: 59 | Admitting: Family Medicine

## 2023-05-09 ENCOUNTER — Encounter: Payer: Self-pay | Admitting: Dermatology

## 2023-05-09 IMAGING — US US RENAL
1 series · 14 of 25 positions shown · non-contrast
Comparison: None.

CLINICAL DATA: Renal calculi, right flank pain for 2 days

EXAM:
RENAL / URINARY TRACT ULTRASOUND COMPLETE

[Series 1: us renal · 14 of 27 slices shown]
[im 1/27]
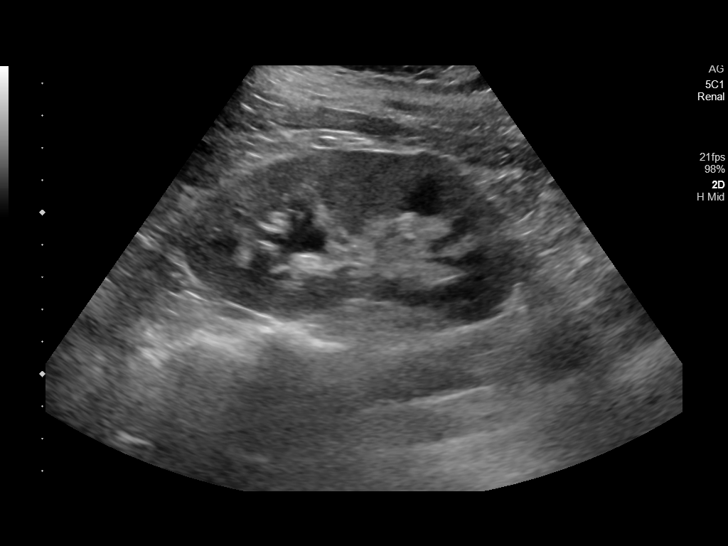
[im 3/27]
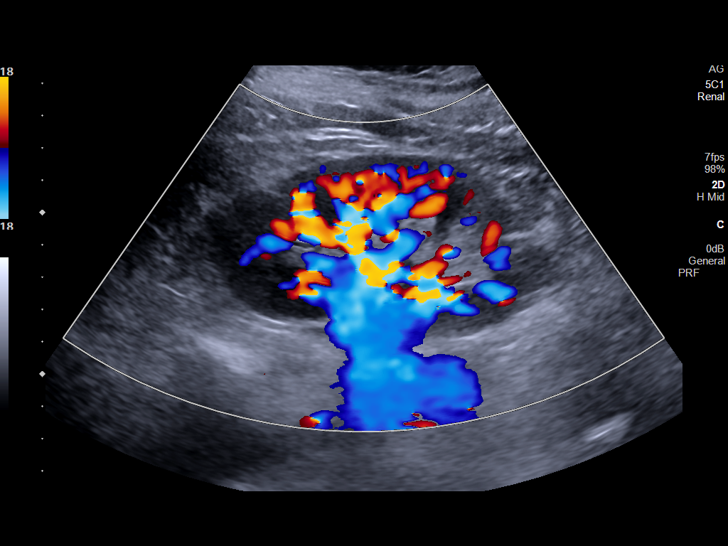
[im 5/27]
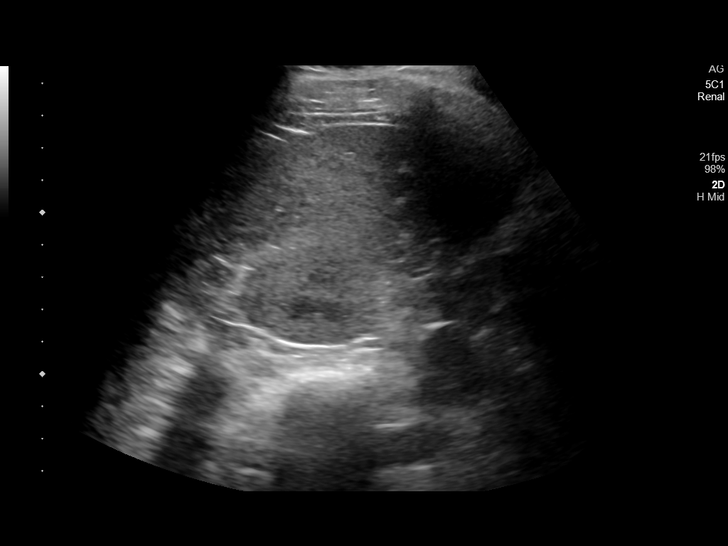
[im 7/27]
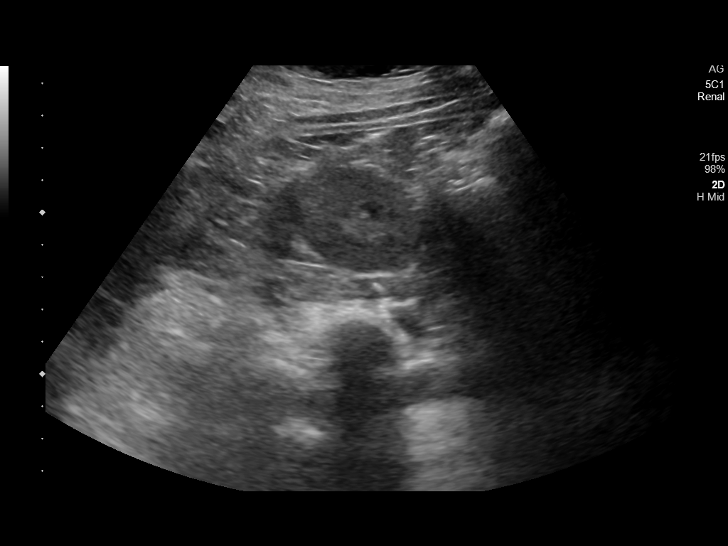
[im 9/27]
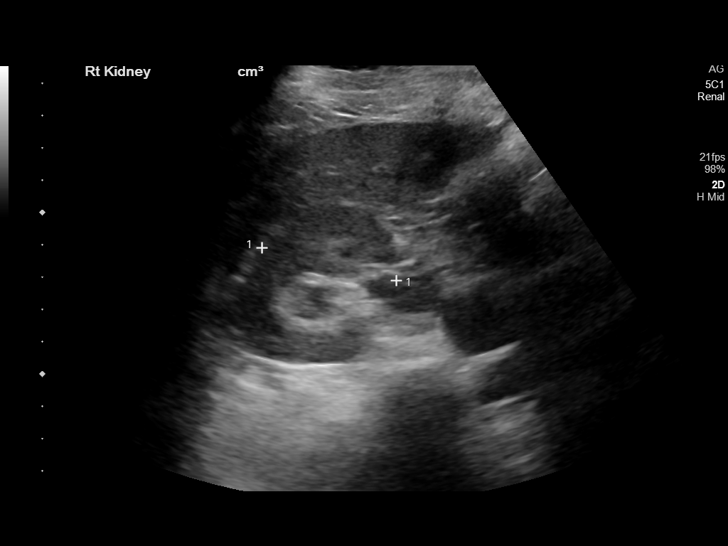
[im 10/27]
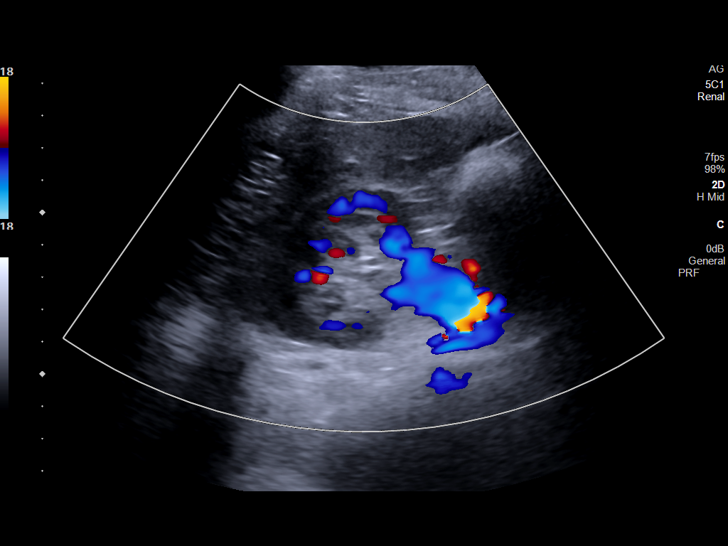
[im 12/27]
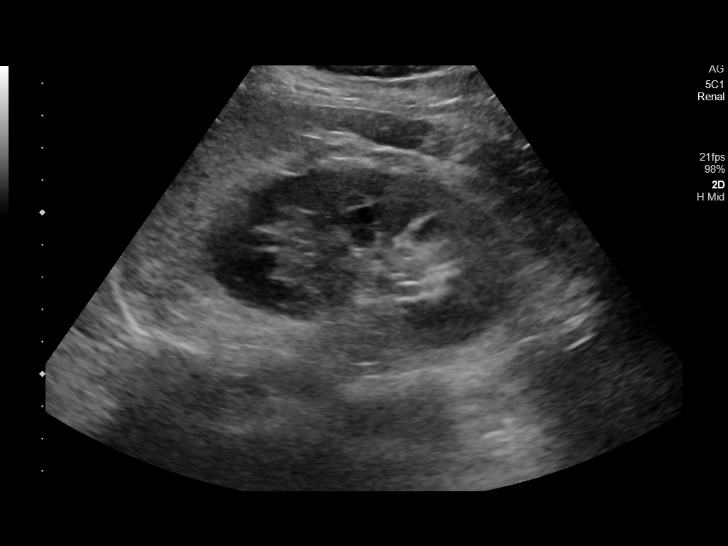
[im 15/27]
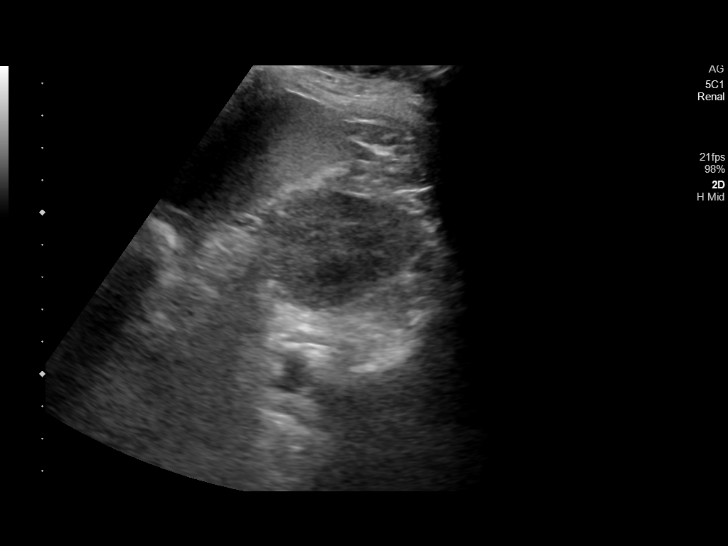
[im 17/27]
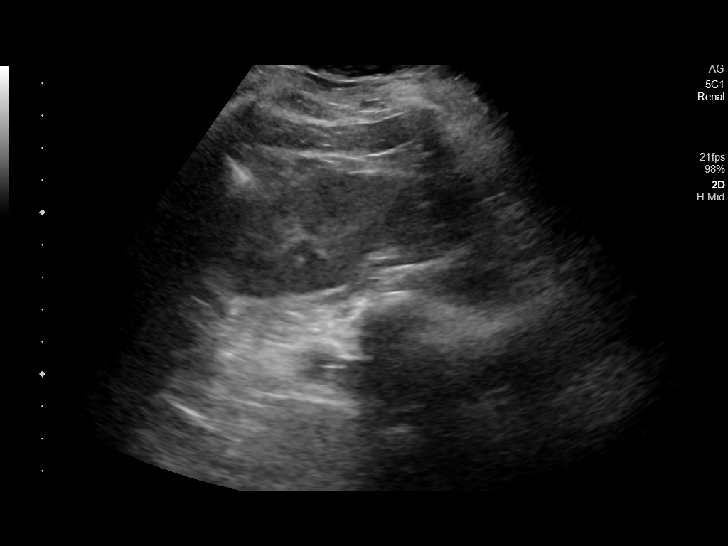
[im 18/27]
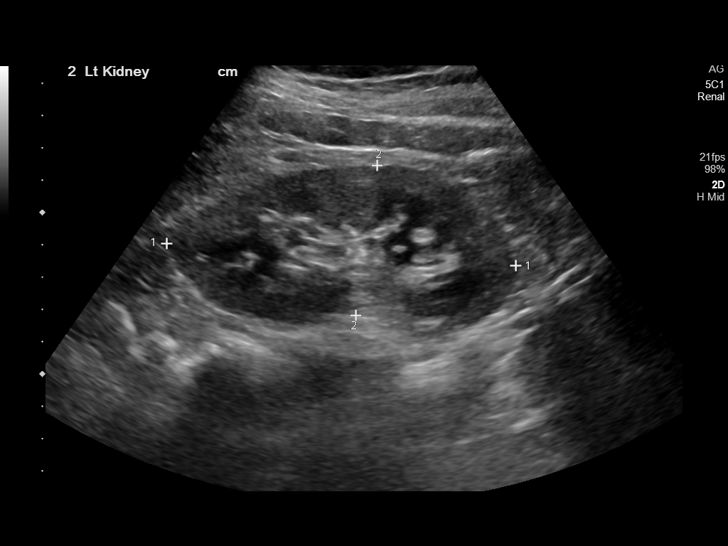
[im 20/27]
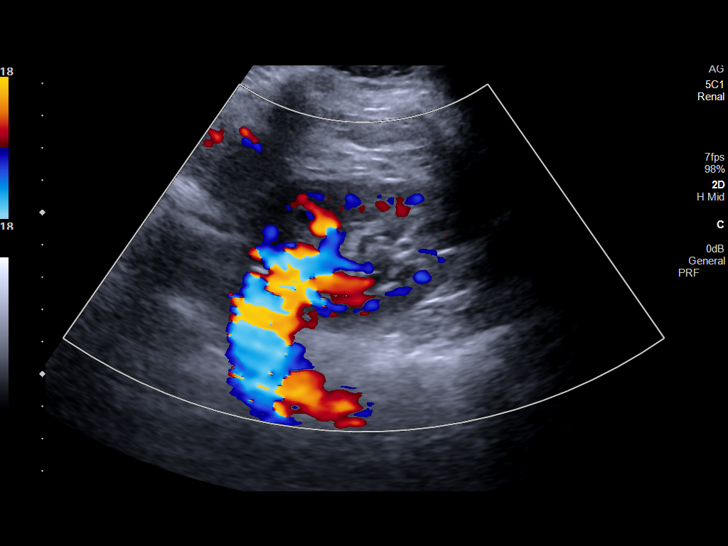
[im 22/27]
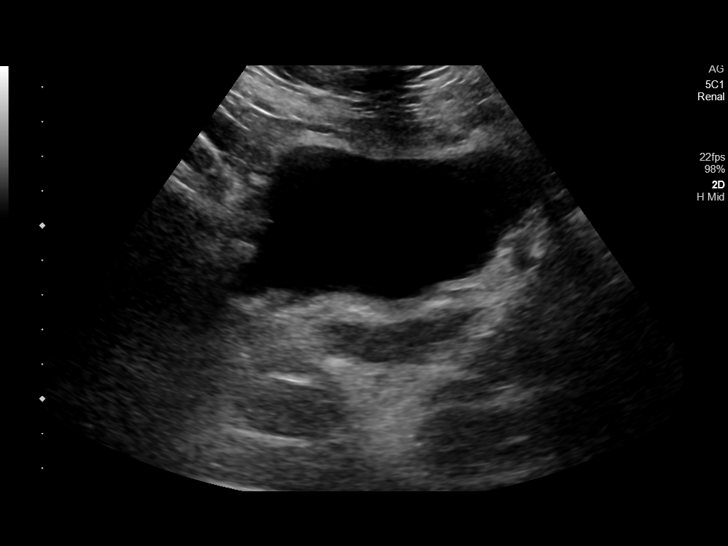
[im 24/27]
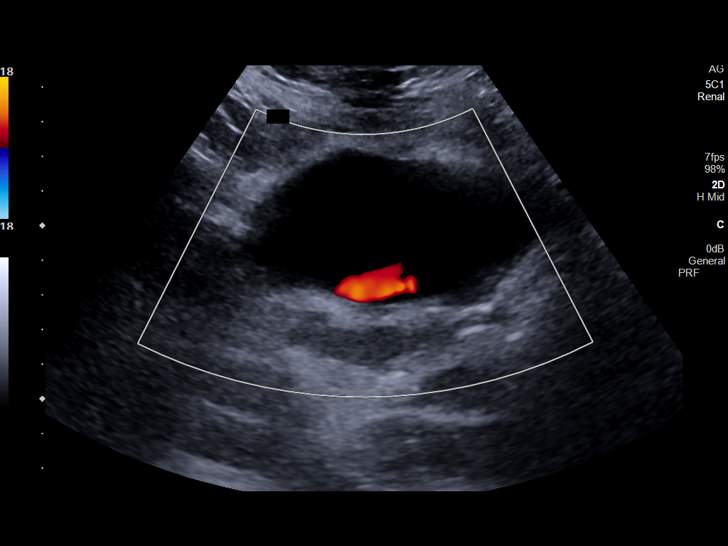
[im 27/27]
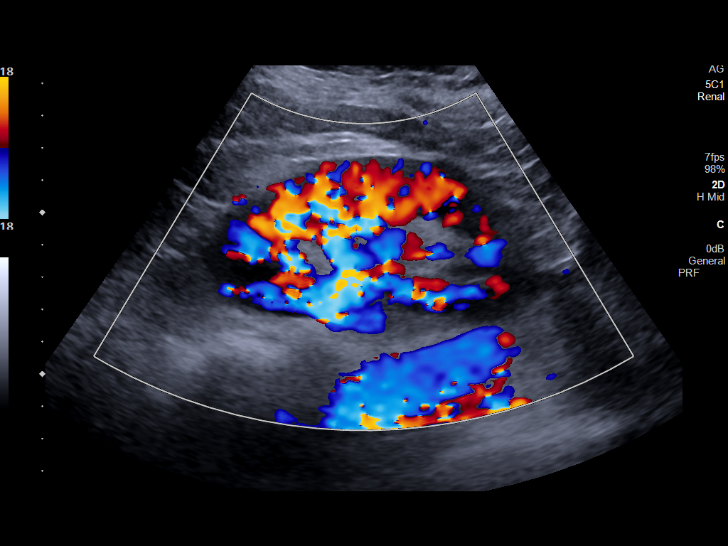

[14 of 25 positions shown; findings below may reference images not displayed]

FINDINGS: Right Kidney:

Renal measurements: 10.6 x 4.6 x 4.3 cm = volume: 108 mL. Normal
renal echogenicity. Mild right hydronephrosis. No nephrolithiasis.

Left Kidney:

Renal measurements: 10.8 x 4.7 x 5.1 cm = volume: 136 mL.
Echogenicity within normal limits. No mass or hydronephrosis
visualized.

Bladder:

Appears normal for degree of bladder distention.

Other:

Gravid uterus is partially visualized with fetus in cephalic
presentation.
IMPRESSION: 1. Minimal right-sided hydronephrosis, which could be due to
extrinsic ureteral compression from the gravid uterus. No evidence
of nephrolithiasis.
2. Otherwise unremarkable renal ultrasound.

## 2023-05-18 ENCOUNTER — Ambulatory Visit (INDEPENDENT_AMBULATORY_CARE_PROVIDER_SITE_OTHER): Payer: 59 | Admitting: Family Medicine

## 2023-05-18 VITALS — BP 113/72 | HR 101 | Temp 97.7°F | Ht 62.0 in | Wt 148.1 lb

## 2023-05-18 DIAGNOSIS — M62838 Other muscle spasm: Secondary | ICD-10-CM | POA: Diagnosis not present

## 2023-05-18 DIAGNOSIS — E663 Overweight: Secondary | ICD-10-CM

## 2023-05-18 DIAGNOSIS — M6208 Separation of muscle (nontraumatic), other site: Secondary | ICD-10-CM

## 2023-05-18 DIAGNOSIS — N926 Irregular menstruation, unspecified: Secondary | ICD-10-CM | POA: Diagnosis not present

## 2023-05-18 LAB — POCT URINE PREGNANCY: Preg Test, Ur: NEGATIVE

## 2023-05-18 NOTE — Progress Notes (Unsigned)
   Established Patient Office Visit  Subjective   Patient ID: Elizabeth Berger, female    DOB: 1990-12-29  Age: 33 y.o. MRN: [id number]  Chief Complaint  Patient presents with   Weight Loss   Torticollis    Lasting 6 months   pregnacy    Need to take a urine test     Period - 04/09/23 - light Neck pain  "Crick in neck"  Weight loss -      {History (Optional):23778}    05/18/2023    3:46 PM 12/15/2022    4:31 PM 12/15/2022    4:29 PM  Depression screen PHQ 2/9  Decreased Interest 0 2 2  Down, Depressed, Hopeless 0 1 1  PHQ - 2 Score 0 3 3  Altered sleeping 0 1 1  Tired, decreased energy 0 3 3  Change in appetite 0 1 1  Feeling bad or failure about yourself  0 0 0  Trouble concentrating 0 0 0  Moving slowly or fidgety/restless 0 0 0  Suicidal thoughts 0 0 0  PHQ-9 Score 0 8 8  Difficult doing work/chores Not difficult at all  Not difficult at all       12/15/2022    4:30 PM 11/15/2022    1:26 PM 10/12/2022    3:59 PM  GAD 7 : Generalized Anxiety Score  Nervous, Anxious, on Edge 1 3 3   Control/stop worrying 0 3 3  Worry too much - different things 0 3 3  Trouble relaxing 1 3 1   Restless 1 3 3   Easily annoyed or irritable 0 0 3  Afraid - awful might happen 0 0 3  Total GAD 7 Score 3 15 19   Anxiety Difficulty  Somewhat difficult Not difficult at all     ROS  Negative unless indicated in HPI   Objective:     BP 113/72 (BP Location: Left Arm, Patient Position: Sitting, Cuff Size: Normal)   Pulse (!) 101   Temp 97.7 F (36.5 C)   Ht 5\' 2"  (1.575 m)   Wt 148 lb 1.6 oz (67.2 kg)   LMP 04/10/2023 (Approximate)   SpO2 100%   BMI 27.09 kg/m  {Vitals History (Optional):23777}  Physical Exam   No results found for any visits on 05/18/23.  {Labs (Optional):23779}  The ASCVD Risk score (Arnett DK, et al., 2019) failed to calculate for the following reasons:   The 2019 ASCVD risk score is only valid for ages 3 to 76    Assessment & Plan:  There  are no diagnoses linked to this encounter.    No follow-ups on file.    Sallee Provencal, FNP

## 2023-05-19 ENCOUNTER — Encounter: Payer: Self-pay | Admitting: Family Medicine

## 2023-05-19 DIAGNOSIS — E663 Overweight: Secondary | ICD-10-CM | POA: Insufficient documentation

## 2023-05-19 DIAGNOSIS — M6208 Separation of muscle (nontraumatic), other site: Secondary | ICD-10-CM | POA: Insufficient documentation

## 2023-05-19 DIAGNOSIS — N926 Irregular menstruation, unspecified: Secondary | ICD-10-CM | POA: Insufficient documentation

## 2023-05-19 DIAGNOSIS — M62838 Other muscle spasm: Secondary | ICD-10-CM | POA: Insufficient documentation

## 2023-05-19 NOTE — Assessment & Plan Note (Addendum)
Pt's LMP was 04/06/23, which typically lasts 3-4 days States period is about 2 weeks lates POC urine pregnancy test in clinic was negative -Discussed hormonal changes post stopping birth control. Can take a few months for body to find natural hormonal cycle, tracking cycle -May repeat test at home if needed if positive suggest hcg serum test. -May need further eval by GYN if continues to have neg urine test and no period

## 2023-05-19 NOTE — Assessment & Plan Note (Signed)
Difficulty losing weight despite intermittent diet and exercise efforts.  -BMI 27.09 -Refer to nutritionist for diet consultation. -Small meals, increase daily protein -Pt walks a lot with family, but denies strength training or other forms of exercise -Recommend and Encourage increased exercise intensity and consistency -Weight training, cycling, yoga, barre, jogging, pilates - full body exercise. -PT referral for diastasis recti for strengthening abdominal wall Weight loss goal of 20 to 25lbs, -Plan to reassess in 3-4 months.

## 2023-05-19 NOTE — Assessment & Plan Note (Signed)
Concerns about abdominal muscle separation post-pregnancy. - limiting exercise and weight loss goals -Refer to physical therapy for strengthening and management.

## 2023-05-19 NOTE — Assessment & Plan Note (Signed)
Chronic neck pain for six months with limited mobility with L lateral flexion and rotation. Pain radiates to shoulder. Pt works at desk, on Data processing manager. Appears to be chronic use, posture issue due to ergonomics. Will benefit from PT management and strengthening cervical muscles. -Refer to physical therapy. -Advise to continue ibuprofen as needed, and consider heat/ice and topical treatments to help alleviate muscle tension

## 2023-05-29 ENCOUNTER — Ambulatory Visit (INDEPENDENT_AMBULATORY_CARE_PROVIDER_SITE_OTHER): Payer: 59 | Admitting: Dermatology

## 2023-05-29 ENCOUNTER — Encounter: Payer: Self-pay | Admitting: Dermatology

## 2023-05-29 ENCOUNTER — Other Ambulatory Visit: Payer: Self-pay

## 2023-05-29 DIAGNOSIS — L219 Seborrheic dermatitis, unspecified: Secondary | ICD-10-CM

## 2023-05-29 MED ORDER — CLOBETASOL PROPIONATE 0.05 % EX SOLN
1.0000 | Freq: Two times a day (BID) | CUTANEOUS | 1 refills | Status: DC | PRN
  Filled 2023-05-29: qty 50, 30d supply, fill #0

## 2023-05-29 MED ORDER — KETOCONAZOLE 2 % EX SHAM
1.0000 | MEDICATED_SHAMPOO | CUTANEOUS | 5 refills | Status: DC
  Filled 2023-05-29: qty 120, 30d supply, fill #0

## 2023-05-29 NOTE — Progress Notes (Addendum)
   Follow-Up Visit   Subjective  Elizabeth Berger is a 33 y.o. female who presents for the following: Itchy, flaky, red scalp symptoms not improving with Nizoral shampoo x 1-1.5 mths.   The following portions of the chart were reviewed this encounter and updated as appropriate: medications, allergies, medical history  Review of Systems:  No other skin or systemic complaints except as noted in HPI or Assessment and Plan.  Objective  Well appearing patient in no apparent distress; mood and affect are within normal limits.   A focused examination was performed of the following areas: the face and scalp   Relevant exam findings are noted in the Assessment and Plan.    Assessment & Plan   SEBORRHEIC DERMATITIS Exam: Pink patches with greasy scale at the frontal scalp. Mild scaling of conchal bowls  Chronic and persistent condition with duration or expected duration over one year. Condition is bothersome/symptomatic for patient. Currently flared.  Seborrheic Dermatitis is a chronic persistent rash characterized by pinkness and scaling most commonly of the mid face but also can occur on the scalp (dandruff), ears; mid chest, mid back and groin.  It tends to be exacerbated by stress and cooler weather.  People who have neurologic disease may experience new onset or exacerbation of existing seborrheic dermatitis.  The condition is not curable but treatable and can be controlled.  Treatment Plan: Start Ketoconazole 2% shampoo 2d/wk. Let sit 5-10 minutes before washing off.   Offered follow up. Patient prefers to call PRN  Start Clobetasol solution to aa's scalp and ears BID PRN. Use Q tip to apply in ears. Topical steroids (such as triamcinolone, fluocinolone, fluocinonide, mometasone, clobetasol, halobetasol, betamethasone, hydrocortisone) can cause thinning and lightening of the skin if they are used for too long in the same area. Your physician has selected the right strength medicine for  your problem and area affected on the body. Please use your medication only as directed by your physician to prevent side effects.  SEBORRHEIC DERMATITIS   Related Medications ketoconazole (NIZORAL) 2 % shampoo Apply 1 Application topically 2 (two) times a week. Shampoo into scalp let sit 5 minutes then wash off. clobetasol (TEMOVATE) 0.05 % external solution Apply 1 Application topically to itchy areas of scalp 2 (two) times daily as needed.  Return if symptoms worsen or fail to improve.  Maylene Roes, CMA, am acting as scribe for Elie Goody, MD .  Documentation: I have reviewed the above documentation for accuracy and completeness, and I agree with the above.  Elie Goody, MD

## 2023-05-29 NOTE — Patient Instructions (Addendum)
Seborrheic Dermatitis is a chronic persistent rash characterized by pinkness and scaling most commonly of the mid face but also can occur on the scalp (dandruff), ears; mid chest, mid back and groin.  It tends to be exacerbated by stress and cooler weather.  People who have neurologic disease may experience new onset or exacerbation of existing seborrheic dermatitis.  The condition is not curable but treatable and can be controlled.   Due to recent changes in healthcare laws, you may see results of your pathology and/or laboratory studies on MyChart before the doctors have had a chance to review them. We understand that in some cases there may be results that are confusing or concerning to you. Please understand that not all results are received at the same time and often the doctors may need to interpret multiple results in order to provide you with the best plan of care or course of treatment. Therefore, we ask that you please give [place] 2 business days to thoroughly review all your results before contacting the office for clarification. Should we see a critical lab result, you will be contacted sooner.   If You Need Anything After Your Visit  If you have any questions or concerns for your doctor, please call our main line at [phone] and press option 4 to reach your doctor's medical assistant. If no one answers, please leave a voicemail as directed and we will return your call as soon as possible. Messages left after 4 pm will be answered the following business day.   You may also send [place] a message via MyChart. We typically respond to MyChart messages within 1-2 business days.  For prescription refills, please ask your pharmacy to contact our office. Our fax number is [phone].  If you have an urgent issue when the clinic is closed that cannot wait until the next business day, you can page your doctor at the number below.    Please note that while we do our best to be available for urgent  issues outside of office hours, we are not available 24/7.   If you have an urgent issue and are unable to reach [place], you may choose to seek medical care at your doctor's office, retail clinic, urgent care center, or emergency room.  If you have a medical emergency, please immediately call 911 or go to the emergency department.  Pager Numbers  - Dr. Gwen Pounds: [phone]  - Dr. Roseanne Reno: [phone]  - Dr. Katrinka Blazing: [phone]   In the event of inclement weather, please call our main line at [phone] for an update on the status of any delays or closures.  Dermatology Medication Tips: Please keep the boxes that topical medications come in in order to help keep track of the instructions about where and how to use these. Pharmacies typically print the medication instructions only on the boxes and not directly on the medication tubes.   If your medication is too expensive, please contact our office at [phone] option 4 or send [place] a message through MyChart.   We are unable to tell what your co-pay for medications will be in advance as this is different depending on your insurance coverage. However, we may be able to find a substitute medication at lower cost or fill out paperwork to get insurance to cover a needed medication.   If a prior authorization is required to get your medication covered by your insurance company, please allow [place] 1-2 business days to complete this process.  Drug prices often vary  depending on where the prescription is filled and some pharmacies may offer cheaper prices.  The website www.goodrx.com contains coupons for medications through different pharmacies. The prices here do not account for what the cost may be with help from insurance (it may be cheaper with your insurance), but the website can give you the price if you did not use any insurance.  - You can print the associated coupon and take it with your prescription to the pharmacy.  - You may also stop  by our office during regular business hours and pick up a GoodRx coupon card.  - If you need your prescription sent electronically to a different pharmacy, notify our office through [facility] or by phone at [phone] option 4.     Si Usted Necesita Algo Despus de Su Visita  Tambin puede enviarnos un mensaje a travs de Clinical cytogeneticist. Por lo general respondemos a los mensajes de MyChart en el transcurso de 1 a 2 das hbiles.  Para renovar recetas, por favor pida a su farmacia que se ponga en contacto con nuestra oficina. Annie Sable de fax es [place] [phone].  Si tiene un asunto urgente cuando la clnica est cerrada y que no puede esperar hasta el siguiente da hbil, puede llamar/localizar a su doctor(a) al nmero que aparece a continuacin.   Por favor, tenga en cuenta que aunque hacemos todo lo posible para estar disponibles para asuntos urgentes fuera del horario de [place], no estamos disponibles las 24 horas del da, los 7 [address]  [address] de la [place].   Si tiene un problema urgente y no puede comunicarse con nosotros, puede optar por buscar atencin mdica  en el consultorio de su doctor(a), en una clnica privada, en un centro de atencin urgente o en una sala de emergencias.  Si tiene Engineer, drilling, por favor llame inmediatamente al 911 o vaya a la sala de emergencias.  Nmeros de bper  - Dr. Gwen Pounds: [phone]  - Dra. Roseanne Reno: [phone]  - Dr. Katrinka Blazing: [phone]   En caso de inclemencias del tiempo, por favor llame a Lacy Duverney principal al [phone] para una actualizacin sobre el [place] de cualquier retraso o cierre.  Consejos para la medicacin en dermatologa: Por favor, guarde las cajas en las que vienen los medicamentos de uso tpico para ayudarle a seguir las instrucciones sobre dnde y cmo usarlos. Las farmacias generalmente imprimen las instrucciones del medicamento slo en las cajas y no directamente en los tubos del [place].   Si su  medicamento es muy caro, por favor, pngase en contacto con Rolm Gala llamando al [phone] y presione la opcin 4 o envenos un mensaje a travs de Clinical cytogeneticist.   No podemos decirle cul ser su copago por los medicamentos por adelantado ya que esto es diferente dependiendo de la cobertura de su seguro. Sin embargo, es posible que podamos encontrar un medicamento sustituto a Audiological scientist un formulario para que el seguro cubra el medicamento que se considera necesario.   Si se requiere una autorizacin previa para que su compaa de seguros [place] su medicamento, por favor permtanos de 1 a 2 das hbiles para completar [address]  Los precios de los medicamentos varan con frecuencia dependiendo del Environmental consultant de dnde se surte la receta y alguna farmacias pueden ofrecer precios ms baratos.  El sitio web www.goodrx.com tiene cupones para medicamentos de Health and safety inspector. Los precios aqu no tienen en cuenta lo que podra costar con la ayuda del seguro (puede ser ms barato con su  seguro), pero el sitio web puede darle el precio si no Visual merchandiser.  - Puede imprimir el cupn correspondiente y llevarlo con su receta a la farmacia.  - Tambin puede pasar por nuestra oficina durante el horario de atencin regular y Education officer, museum una tarjeta de cupones de GoodRx.  - Si necesita que su receta se enve electrnicamente a una farmacia diferente, informe a nuestra oficina a travs de MyChart de [place] o por telfono llamando al [phone] y presione la opcin 4.

## 2023-06-07 ENCOUNTER — Encounter: Payer: 59 | Attending: Family Medicine | Admitting: Dietician

## 2023-06-07 ENCOUNTER — Encounter: Payer: Self-pay | Admitting: Dietician

## 2023-06-07 VITALS — Ht 62.0 in | Wt 149.7 lb

## 2023-06-07 DIAGNOSIS — E663 Overweight: Secondary | ICD-10-CM

## 2023-06-07 NOTE — Progress Notes (Signed)
 [employer] Employee self referral nutrition session: Start time: 0845   End time: 0930  Height: 5'2 Weight: 149.7lbs BMI: 27.38 Patient's goal weight: about 135lbs  Met with employee to discuss his/her nutritional concerns of: Weight management, healthy eating habits  Diet history:  Most weight gain has been since pregnancy 2 years ago. Followed keto diet for several months with minimal weight loss which she regained immediately when halting the diet.  Was size 6 prior to pregnancy and would like to get back to that  Typical eating pattern: Breakfast: usually at [employer]-- toast, bacon, tomatoes Snack: drawer at work -- chips, cookies, etc Lunch: cafeteria wrap; pasta bar  Snack: same as am Supper: 2/5 fried pork chop and  mashed potatoes; tacos; hot dogs or burgers Snack: same as pm, sweets often  Beverages: water, sugar free cherry ginger ale, occ coke   Education topics covered during this visit: General nutrition/ Healthy eating  -- appropriate nutritional balance, healthy intervals/ schedule for meals and snacks Exercise -- role in weight management Weight Concerns -- basic meal plan using plate method, for 1300-1400kcal for gradual weight loss with 45% CHO, 25% pro, (30% fat); portion control, making low fat and low sugar choices; including protein source and high fiber foods for satiety; lower calorie snack options   Educational resources provided: Designer, Industrial/product with food lists Tips for Managing Food Cravings Tips for Successful Weight Loss   Additional Comments: Patient voices understanding of instruction and readiness to make dietary changes.  Established specific goals with direction from patient.   Plan: Return 07/16/23 at 4:45pm

## 2023-06-07 NOTE — Patient Instructions (Signed)
 Bring healthy snacks to work to limit less healthy choices. Fruits with healthy dip (like vanilla greek yogurt blended with peanut butter and small amount honey); graham crackers; whole grain chips/ crisps; hummus and veggies Wait at least 2-3 hours after a meal before eating a snack.  Control portions of starches, ideally keep pasta, rice, potatoes to 1 cup (fist size) or less. Increase portions of low-carb veggies to keep a satisfying volume of food.

## 2023-06-12 ENCOUNTER — Other Ambulatory Visit: Payer: Self-pay

## 2023-06-16 NOTE — Progress Notes (Unsigned)
Cardiology Office Note  Date:  06/18/2023   ID:  Elizabeth Berger, DOB [birth year], MRN [id number]  PCP:  Sallee Provencal, FNP   Chief Complaint  Patient presents with   Follow up Zio monitor.     "Doing well."     HPI:  [name] is a 33 year old woman with no prior cardiac history Who presents for follow-up of her tachycardia/palpitations, near-syncope  Last seen in clinic 06/2021 Seen by one of our providers 11/24 Reported having palpitations  Zio monitor showing normal sinus rhythm average rate 87-91 No significant arrhythmia Triggered events associated with normal sinus rhythm  Reports that she takes propranolol 10 mg every evening In general sleeping well, rare episodes of breakthrough tachycardia Stress and anxiety will bring on additional episodes Has not required much propranolol in the daytime 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  Denies orthostasis symptoms  Prior imaging studies reviewed Echocardiogram Jun 13 2021, normal study  Reports when she gets severe abdominal cramping, will sometimes develop tightness in the chest  Other past medical history reviewed Recent episode mid January 2023, She was very busy doing activities at home Got very hot Had episodes of severe abdominal cramping, felt like a bowel movement, attempted to go to the bathroom Had urge to vomit, got diaphoretic dizzy She lay down on the floor, boyfriend and her, gave her some water, after several minutes started to feel better   PMH:   has a past medical history of Anxiety, Depression, FH: migraines, Herpes, and Migraines.  PSH:    Past Surgical History:  Procedure Laterality Date   BREAST BIOPSY Left 12/18/2022   [place] bx mass, heart marker, path pending   BREAST BIOPSY Left 12/18/2022   [place] LT BREAST BX W LOC DEV 1ST LESION IMG BX SPEC US GUIDE 12/18/2022 [facility]   TONSILLECTOMY      Current Outpatient Medications  Medication Sig Dispense Refill    clobetasol (TEMOVATE) 0.05 % external solution Apply 1 Application topically to itchy areas of scalp 2 (two) times daily as needed. 50 mL 1   hydrOXYzine (ATARAX) 10 MG tablet Take 1 tablet (10 mg total) by mouth every 6 (six) hours as needed for anxiety (sleep). 90 tablet 3   ketoconazole (NIZORAL) 2 % shampoo Apply 1 Application topically 2 (two) times a week. Shampoo into scalp let sit 5 minutes then wash off. 120 mL 5   propranolol (INDERAL) 10 MG tablet Take 1 tablet (10 mg total) by mouth 3 (three) times daily. 270 tablet 3   Vitamin D, Ergocalciferol, (DRISDOL) 1.25 MG (50000 UNIT) CAPS capsule Take 1 capsule (50,000 Units total) by mouth every 7 (seven) days. 5 capsule 0   nitrofurantoin, macrocrystal-monohydrate, (MACROBID) 100 MG capsule Take 1 capsule (100 mg total) by mouth 2 (two) times daily. (Patient not taking: Reported on 06/18/2023) 10 capsule 0   norethindrone (INCASSIA) 0.35 MG tablet Take 1 tablet (0.35 mg total) by mouth daily. (Patient not taking: Reported on 06/18/2023) 84 tablet 3   prochlorperazine (COMPAZINE) 10 MG tablet Take 1 tablet (10 mg total) by mouth every 6 (six) hours as needed for nausea or vomiting. (Patient not taking: Reported on 06/18/2023) 30 tablet 0   venlafaxine XR (EFFEXOR XR) 75 MG 24 hr capsule Take 1 capsule (75 mg total) by mouth at bedtime. (Patient not taking: Reported on 06/18/2023) 30 capsule 2   No current facility-administered medications for this visit.     Allergies:   Patient  has no known allergies.   Social History:  The patient  reports that she has quit smoking. She has never used smokeless tobacco. She reports that she does not currently use alcohol. She reports that she does not currently use drugs.   Family History:   family history includes Arthritis in her mother; Cancer in her maternal grandfather; Cancer (age of onset: 53) in her maternal grandmother; Cervical cancer in her mother; Depression in her maternal grandmother and  mother; Hearing loss in her maternal grandfather.    Review of Systems: Review of Systems  Constitutional: Negative.   HENT: Negative.    Respiratory: Negative.    Cardiovascular: Negative.   Gastrointestinal: Negative.   Musculoskeletal: Negative.   Neurological: Negative.   Psychiatric/Behavioral: Negative.    All other systems reviewed and are negative.    PHYSICAL EXAM: VS:  BP 90/70 (BP Location: Left Arm, Patient Position: Sitting, Cuff Size: Normal)   Pulse (!) 102   Ht 5\' 2"  (1.575 m)   Wt 150 lb (68 kg)   SpO2 99%   BMI 27.44 kg/m  , BMI Body mass index is 27.44 kg/m. Constitutional:  oriented to person, place, and time. No distress.  HENT:  Head: Grossly normal Eyes:  no discharge. No scleral icterus.  Neck: No JVD, no carotid bruits  Cardiovascular: Regular rate and rhythm, no murmurs appreciated Pulmonary/Chest: Clear to auscultation bilaterally, no wheezes or rails Abdominal: Soft.  no distension.  no tenderness.  Musculoskeletal: Normal range of motion Neurological:  normal muscle tone. Coordination normal. No atrophy Skin: Skin warm and dry Psychiatric: normal affect, pleasant  Recent Labs: 09/13/2022: ALT 13 02/26/2023: BUN 10; Creatinine, Ser 0.48; Hemoglobin 15.3; Platelets 269; Potassium 3.6; Sodium 139; TSH 2.189    Lipid Panel Lab Results  Component Value Date   CHOL 164 09/13/2022   HDL 48 09/13/2022   LDLCALC 89 09/13/2022   TRIG 154 (H) 09/13/2022      Wt Readings from Last 3 Encounters:  06/18/23 150 lb (68 kg)  06/07/23 149 lb 11.2 oz (67.9 kg)  05/18/23 148 lb 1.6 oz (67.2 kg)     ASSESSMENT AND PLAN:  Problem List Items Addressed This Visit       Cardiology Problems   Paroxysmal tachycardia (HCC) - Primary     Other   Sinus tachycardia   Other Visit Diagnoses       Palpitations           Sinus tachycardia Since well-controlled on propranolol 10 mg in the evening Takes additional propranolol as  needed Additional options would be metoprolol  tartrate 12.5 as needed  Near syncope No further episodes of orthostasis, prior episode of vasovagal presyncope Recommend she stay hydrated  History of COVID-19  COVID  winter 2022 Normal echocardiogram Has recovered  Signed, Dossie Arbour, M.D., Ph.D. [facility] [place], [place] [phone]

## 2023-06-18 ENCOUNTER — Ambulatory Visit: Payer: 59 | Attending: Cardiovascular Disease | Admitting: Cardiovascular Disease

## 2023-06-18 ENCOUNTER — Other Ambulatory Visit: Payer: Self-pay

## 2023-06-18 ENCOUNTER — Encounter: Payer: Self-pay | Admitting: Cardiovascular Disease

## 2023-06-18 ENCOUNTER — Other Ambulatory Visit (HOSPITAL_COMMUNITY): Payer: Self-pay

## 2023-06-18 VITALS — BP 90/70 | HR 102 | Ht 62.0 in | Wt 150.0 lb

## 2023-06-18 DIAGNOSIS — I479 Paroxysmal tachycardia, unspecified: Secondary | ICD-10-CM | POA: Diagnosis not present

## 2023-06-18 DIAGNOSIS — R002 Palpitations: Secondary | ICD-10-CM | POA: Diagnosis not present

## 2023-06-18 DIAGNOSIS — R000 Tachycardia, unspecified: Secondary | ICD-10-CM | POA: Insufficient documentation

## 2023-06-18 NOTE — Patient Instructions (Signed)

## 2023-06-19 ENCOUNTER — Other Ambulatory Visit: Payer: Self-pay

## 2023-06-19 ENCOUNTER — Encounter: Payer: Self-pay | Admitting: Pharmacist

## 2023-06-19 ENCOUNTER — Other Ambulatory Visit (HOSPITAL_COMMUNITY): Payer: Self-pay

## 2023-06-22 ENCOUNTER — Other Ambulatory Visit (HOSPITAL_COMMUNITY): Payer: Self-pay

## 2023-07-16 ENCOUNTER — Ambulatory Visit: Payer: 59 | Admitting: Dietician

## 2023-07-18 ENCOUNTER — Ambulatory Visit: Payer: 59 | Attending: Family Medicine

## 2023-07-18 DIAGNOSIS — M542 Cervicalgia: Secondary | ICD-10-CM | POA: Insufficient documentation

## 2023-07-18 DIAGNOSIS — M6281 Muscle weakness (generalized): Secondary | ICD-10-CM | POA: Diagnosis not present

## 2023-07-18 DIAGNOSIS — M62838 Other muscle spasm: Secondary | ICD-10-CM | POA: Insufficient documentation

## 2023-07-18 DIAGNOSIS — M6208 Separation of muscle (nontraumatic), other site: Secondary | ICD-10-CM | POA: Insufficient documentation

## 2023-07-18 NOTE — Therapy (Signed)
 OUTPATIENT PHYSICAL THERAPY EVALUATION   Patient Name: Elizabeth Berger MRN: [id number] DOB:05-29-1990, 33 y.o., female Today's Date: 07/19/2023  END OF SESSION:  PT End of Session - 07/19/23 1619     Visit Number 1    Number of Visits 12    Date for PT Re-Evaluation 09/12/23    Authorization Type [facility] [employer]    Authorization Time Period 07/18/23-09/12/23    Progress Note Due on Visit 10    PT Start Time 1602    PT Stop Time 1642    PT Time Calculation (min) 40 min    Activity Tolerance Patient tolerated treatment well;No increased pain    Behavior During Therapy WFL for tasks assessed/performed             Past Medical History:  Diagnosis Date   Anxiety    Depression    FH: migraines    mom   Herpes    1&2 IgG+   Migraines    Past Surgical History:  Procedure Laterality Date   BREAST BIOPSY Left 12/18/2022   [place] bx mass, heart marker, path pending   BREAST BIOPSY Left 12/18/2022   [place] LT BREAST BX W LOC DEV 1ST LESION IMG BX SPEC US GUIDE 12/18/2022 [facility]   TONSILLECTOMY     Patient Active Problem List   Diagnosis Date Noted   Sinus tachycardia 06/18/2023   Late menses 05/19/2023   Overweight (BMI 25.0-29.9) 05/19/2023   Diastasis recti 05/19/2023   Cervical paraspinal muscle spasm 05/19/2023   Stress and adjustment reaction 12/15/2022   Periodic headache syndrome, not intractable 12/15/2022   Transient neurological symptoms 12/15/2022   Paroxysmal tachycardia (HCC) 10/12/2022   Generalized anxiety disorder with panic attacks 10/12/2022   Urgency of urination 07/31/2022   Avitaminosis D 07/31/2022   Annual physical exam 04/18/2019    PCP: Charlcie Cradle, FNP REFERRING PROVIDER: Sallee Provencal, FNP REFERRING DIAG: Neck pain; postpartum diastasis rectus abdominus   THERAPY DIAG:  Muscle weakness (generalized)  Cervicalgia Rationale for Evaluation and Treatment: Rehabilitation ONSET DATE: Diastasis Rectus (~2 years ago);  Neck pain (1 years)   SUBJECTIVE:                                                                                                                                                                                                        SUBJECTIVE STATEMENT: Pt reports pregnancy wrecked her body- tachycardia issues that limit exertion, ongoing abdominal core weakness, and her neck pain has not improved since she slept funny ~ 1 year ago.   PERTINENT  HISTORY:  Elizabeth Berger is a 32yoF who is referred to OPPT for both chronic diastasis rectus abdominus problem (now 2 years postpartum) and a newer neck pain problem. Patient reports a restriction in mobility, particularly when turning her neck to left. The pain, described as sharp and radiating from the neck to the shoulder, intensifies to a level of 10 upon reaching a certain point of movement.PMH: urinary retention, constipation, peripartum perineal trauma, migraine HA, dizziness. Pt also followed by cardiology for sinus tachycardia, palpitations, near syncope since pregnancy- pt also reports Covid-19 infection x4. Pt on BB therapy TID or as needed. Pt has also been working toward achieving health weight loss goal through diet and exercise. 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   PAIN:  Are you having pain? No At best: 0/10 no pain at rest  At worst: 10/10   PRECAUTIONS: None  WEIGHT BEARING RESTRICTIONS: No  FALLS:  Has patient fallen in last 6 months? 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   OBJECTIVE:  Note: Objective measures were completed at  Evaluation unless otherwise noted.  DIAGNOSTIC FINDINGS:  None on neck;   PATIENT SURVEYS:  NDI: 14% disability   POSTURE:  WNL   PALPATION: Cervical extensor trigger points, generally low cervical extensor muscle mass   CERVICAL ROM:   Active ROM A/PROM (deg) eval  Flexion WNL  Extension 64  Right lateral flexion 45  Left lateral flexion 34*  Right rotation 80  Left rotation 68* (5/10)   (Blank rows = not tested) BED MOBILITY: Independent; Mid thoracic pain lying supine: 5/10   CERVICAL SPECIAL TESTS:  Neck flexor muscle endurance test: Positive -11sec first time -15 sec 2nd time after cues to perform with maintained chin tuck   CERVICAL SPINE PASSIVE ACCESSORY INTERVERTEBRAL MOVEMENT -central PA spring testing from C2-C6: slightly hypermobile, nonfocal tenderness -unilateral facet PA testing: (+) Left side ~C5/6 level, more specific to taut muscle band than isolated facet mobilization. Unable to recreate left facet pain with Rt to Left lateral glide.    DIASTASIS ASSESSMENT:  Pt instructed to perform self palpation to use finger width measurement.  Pt reports no knowledge of any gap between muscles.  Pt describes her problem in specific as a rounded ABD buldging issue, particularly greater buldging of the upper portion of ABD postprandial which results in a 'B' shaped ABD from side view.   Assessment indicates ~1-2 finger width between 2 muscles, questionably moreso cranial to the umbilicus.   TREATMENT DATE 07/19/23         -DNF endurance hold 1x10sec, 1x15sec (limited by gross fatigue)  -mysfascial release to Left cervical extensor bundle                                                                                                                       -  quadruped belly breathing intervention for transversus abdominis activation  1. Cued inhalation with passive ABD expansion with gravity  2. Cued exhalation with active abdominal draw-in maneuver following  exhale  -2x10x3secH -education on how all abdominal muscle layers function  -education on correlation between historical urinary retention, chronic constipation with post partum pelvic floor hypertonicity correlate with current ABD motor weakness problem- possible need for referral to pelvic PT in future pending progress here.  -education on common presentation of long-COVID POTS issues. Pt reports her tachycardia issues are related to pregnancy, not COVID infections.    PATIENT EDUCATION:  Education details: See above (as well as HEP handout)  Person educated: Programmer, systems: collaborative learning, deliberate practice, positive reinforcement, explicit instruction, establish rules. Education comprehension: seems pretty good  HOME EXERCISE PROGRAM: -Access Code: [email or web address] URL: [email or web address] Date: 07/18/2023 Prepared by: Alvera Novel  Exercises - Quadruped Transversus Abdominis Bracing  - 2 x daily - 2 sets - 10 reps - 3 hold - Seated Transversus Abdominis Bracing  - 2 x daily - 3 sets - 10 reps - 3 hold  ASSESSMENT:  CLINICAL IMPRESSION: 32yoF referred for chronic neck pain after insidious onset 1 year prior, correlates with left sided facet closure, particularly side bend and rotation, extension tolerated pain free, notable taut bands and trigger points in neck. Pt has signs of WNL hypermobility, author explained potential for cervical joint hyperextension during sleep phases which can result in joint irritation and protective spasm. Pt also shows limitations in deep cervical flexors which correlates well with her reports of neck fatigue toward end of day. Pt also complaining of abdominal motor weakness now 2 years post partum. Despite referral, do not see signs of true diastasis of abdominal wall, however pt does exhibit imbalance between intrinsic and extrinsic abdominal muscle layers. Educated patient on ways to begin engaging this deeper layer of muscles  and explained some possible connection to other medical problems she has been treated for. Patient will benefit from skilled physical therapy intervention to reduce deficits and impairments identified in evaluation, in order to reduce pain, improve quality of life, and maximize activity tolerance for ADL, IADL, and leisure/fitness. Physical therapy will help pt achieve long and short term goals of care.     OBJECTIVE IMPAIRMENTS: decreased activity tolerance, decreased endurance, decreased knowledge of condition, decreased mobility, decreased ROM, decreased strength, increased muscle spasms, improper body mechanics, postural dysfunction, and prosthetic dependency .   ACTIVITY LIMITATIONS: carrying, lifting, bending, sleeping, transfers, bed mobility, continence, and toileting  PARTICIPATION LIMITATIONS: cleaning, laundry, interpersonal relationship, driving, community activity, occupation, and yard work  PERSONAL FACTORS: Age, Behavior pattern, Education, Fitness, Past/current experiences, and Profession are also affecting patient's functional outcome.   REHAB POTENTIAL: Good  CLINICAL DECISION MAKING: Evolving/moderate complexity  EVALUATION COMPLEXITY: Moderate   GOALS: Goals reviewed with patient? No  SHORT TERM GOALS: Target date: 08/19/23  Pt to perform all driving maneuvers pain free.  Baseline: pain with head turns  Goal status: INITIAL  2.  Deep neck flexor endurance of >25 seconds absent any HA.  Baseline: 11-15sec Goal status: INITIAL  3.  Successful abdominal draw in maneuver >5sec in quadruped, short sitting, supine, and standing. Baseline:  Goal status: INITIAL  LONG TERM GOALS: Target date: 09/18/23  Ability to perform deadbug exercise without loss of lumbo pelvic isometric control.  Baseline:  Goal status: INITIAL  2.  Pt able to perform ABD plank exercise x60sec without loss of spne curvature or change in hip flexion angle, absent  pain.  Baseline:  Goal  status: INITIAL  3.  NDI score of 0%.  Baseline:  Goal status: INITIAL  PLAN:  PT FREQUENCY: 1-2x/week  PT DURATION: 8 weeks  PLANNED INTERVENTIONS: 97110-Therapeutic exercises, 97530- Therapeutic activity, 97112- Neuromuscular re-education, 97535- Self Care, 16109- Manual therapy, [id number]- Electrical stimulation (unattended), [phone]- Electrical stimulation (manual), Patient/Family education, Balance training, Stair training, Dry Needling, Joint mobilization, and Joint manipulation  PLAN FOR NEXT SESSION: Review HEP, advance as indicated. Trial joint mobilization Left cervical facets and repeat SNAGs  4:59 PM, 07/19/23 Rosamaria Lints, PT, DPT Physical Therapist - [place] [phone] (Office)    [place] C, PT 07/19/2023, 4:59 PM

## 2023-07-23 NOTE — Therapy (Unsigned)
 OUTPATIENT PHYSICAL THERAPY TREATMENT   Patient Name: Elizabeth Berger MRN: [id number] DOB:[birth year], 33 y.o., female Today's Date: 07/24/2023  END OF SESSION:  PT End of Session - 07/24/23 1922     Visit Number 2    Number of Visits 12    Date for PT Re-Evaluation 09/12/23    Authorization Type [employer] reporting period from 07/18/2023    Progress Note Due on Visit 10    PT Start Time 1650    PT Stop Time 1735    PT Time Calculation (min) 45 min    Activity Tolerance Patient tolerated treatment well;No increased pain    Behavior During Therapy WFL for tasks assessed/performed              Past Medical History:  Diagnosis Date   Anxiety    Depression    FH: migraines    mom   Herpes    1&2 IgG+   Migraines    Past Surgical History:  Procedure Laterality Date   BREAST BIOPSY Left 12/18/2022   [place] bx mass, heart marker, path pending   BREAST BIOPSY Left 12/18/2022   [place] LT BREAST BX W LOC DEV 1ST LESION IMG BX SPEC US GUIDE 12/18/2022 [facility]   TONSILLECTOMY     Patient Active Problem List   Diagnosis Date Noted   Sinus tachycardia 06/18/2023   Late menses 05/19/2023   Overweight (BMI 25.0-29.9) 05/19/2023   Diastasis recti 05/19/2023   Cervical paraspinal muscle spasm 05/19/2023   Stress and adjustment reaction 12/15/2022   Periodic headache syndrome, not intractable 12/15/2022   Transient neurological symptoms 12/15/2022   Paroxysmal tachycardia (HCC) 10/12/2022   Generalized anxiety disorder with panic attacks 10/12/2022   Urgency of urination 07/31/2022   Avitaminosis D 07/31/2022   Annual physical exam 04/18/2019    PCP: Elizabeth Cradle, FNP REFERRING PROVIDER: Sallee Provencal, FNP REFERRING DIAG: Neck pain; postpartum diastasis rectus abdominus   THERAPY DIAG:  Muscle weakness (generalized)  Cervicalgia Rationale for Evaluation and Treatment: Rehabilitation ONSET DATE: Diastasis Rectus (~2 years ago); Neck pain (1  years)   SUBJECTIVE:                                                                                                                                                                                                         PERTINENT HISTORY:  Elizabeth Berger is a 33yoF who is referred to OPPT for both chronic diastasis rectus abdominus problem (now 2 years postpartum) and a newer neck pain problem. Patient reports a restriction in mobility,  particularly when turning her neck to left. The pain, described as sharp and radiating from the neck to the shoulder, intensifies to a level of 10 upon reaching a certain point of movement.PMH: urinary retention, constipation, peripartum perineal trauma, migraine HA, dizziness. Pt also followed by cardiology for sinus tachycardia, palpitations, near syncope since pregnancy- pt also reports Covid-19 infection x4. Pt on BB therapy TID or as needed. Pt has also been working toward achieving health weight loss goal through diet and exercise. 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   Exercise history:  prior to pregnancy she was more active out of the home and is more sedentary now, but she did not exercise regularly. Marland Kitchen   She wants to be able to exercise more. She tries to look up exercises on youtube. She thinks she is able to exercise about 2 x a week for at least 30 minutes. She is limited when her heart races and it.She uses a heart rate monitor and it gets up to 160 and 170 bpm and she starts to feel dizzy (this happens a lot less than it did when she was freshly postpartum). There is not specific thing that makes her do it. She can stop before she passes out. She can work out when her child's dad is home and she can have a few minutes.   Pelvic floor specific questions:  During her labor she pushed for over 6 hours. She had perineal tearing with stitching. She has not had pelvic floor PT. She could not pee for the first few days (needed a catheter) and for several  months. She had to strain for several months to pee. She sometimes finds that urinates more volume than she was aware was in there but she no longer has to bear down. She does leak with sneezing but is always aware of it. She continues to have difficulty with constipation without an obvious cause. After birth she had really bad hemmhorids and something else. She had to have suppositories to bring the swelling down. Cannot get everything out. The stool is hard, dry, and pebbly. This has been a problem just after pregnancy. She does have trouble with pain with sex (about 80% of the time). Hurts with and without a lubricant. She does feel some heaviness when holding her daughter for a long time or going up stairs while holding her   SUBJECTIVE STATEMENT: Patient reports she is feeling okay today. Her neck felt a little stiff after initial PT session. The HEP has been going well but she does not feel a lot happening in her abdominal muscles.   PAIN:  Are you having pain? 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   OBJECTIVE:   TREATMENT   Neuromuscular Re-education: a technique or exercise performed with the goal of improving the level of communication between the body and the brain, such as for balance, motor control, muscle activation patterns, coordination, desensitization, quality of muscle contraction, proprioception, and/or kinesthetic sense needed for successful and safe completion of functional activities.   Educated patient about core cannister and pelvic floor anatomy and function in relationship to her core weakness, other relevant complaints, and treatment/exercises. Education to help her understand how to perform pelvic floor ROM exercise which requires significant coordination.   Patient practiced moving  pelvic floor through full ROM in seated  position ("basement, first floor, and attic") and voiced she felt it was doing what it was supposed to. Patient educated in ways to check at home using a mirror and/or self-palpation.  Added to HEP.   Review of HEP:  Quadruped diaphragmatic breathing intervention for transversus abdominis activation and awareness.   1x10 Seated diaphragmatic breathing 1x10 Good coordination with breath but pt states she does not feel a lot going on especially in the upper abs when she performs ADIM.   Hooklying curl up with focus on including TrA contraction while breathing 1x10 with 10 second holds Patient finds this challenging but able to perform well Added to HEP with handout  Supine happy baby with breathing concentration on relaxing pelvic floor and lower abs when taking breaths.  1x~30 seconds (feels some tightness in the hips).  Added to HEP with handout  Pt required multimodal cuing for proper technique and to facilitate improved neuromuscular control, strength, range of motion, and functional ability resulting in improved performance and form.   PATIENT EDUCATION:  Education details: See above (as well as HEP handout)  Person educated: Programmer, systems: collaborative learning, deliberate practice, positive reinforcement, explicit instruction, establish rules. Education comprehension: seems pretty good  HOME EXERCISE PROGRAM: Access Code: [facility] URL: [email or web address] Date: 07/24/2023 Prepared by: Norton Blizzard  Exercises - Quadruped Transversus Abdominis Bracing  - 2 x daily - 2 sets - 10 reps - 3 hold - Seated Transversus Abdominis Bracing  - 2 x daily - 3 sets - 10 reps - 3 hold - Seated Pelvic Floor Contraction  - 3 x daily - 1 sets - 10-20 reps - Happy Baby with Pelvic Floor Lengthening  - 1-3 x daily - 3-4 reps - 20-30 seconds hold - Curl Up with Arms Crossed  - 1-3 x daily - 10 reps - 10 seconds   hold  ASSESSMENT:  CLINICAL IMPRESSION: Patient arrives with good tolerance to initial eval and good carry over with HEP. Further subjective exam revealed likely stress incontinence and symptoms associated with hypertonic pelvic floor that could be impacting her abdominal weakness and even neck complaints. Therefore, pelvic floor-related intervention was initiated with education on the anatomy and function of the pelvic floor with progression to improving awareness of its motion in the body. Plan to incorporate this into patient's treatment program to improve her outcomes with core strengthening and preparation for future pregnancies. Patient appeared to understand well and was able to tolerate increased loading of the abdominal muscles (although she continued to feel very weak). Plan to continue with progressions for the core cannister and neck as appropriate. Patient would benefit from continued management of limiting condition by skilled physical therapist to address remaining impairments and functional limitations to work towards stated goals and return to PLOF or maximal functional independence.    From initial PT evaluation on 07/18/2023:  32yoF referred for chronic neck pain after insidious onset 1 year prior, correlates with left sided facet closure, particularly side bend and rotation, extension tolerated pain free, notable taut bands and trigger points in neck. Pt has signs of WNL hypermobility, author explained potential for cervical joint hyperextension during sleep phases which can result in joint irritation and protective spasm. Pt also shows limitations in deep cervical flexors which correlates well with her reports of neck fatigue toward end of day. Pt also complaining of abdominal motor weakness now 2 years post partum. Despite referral, do not see signs of true diastasis of abdominal wall, however pt does exhibit imbalance between  intrinsic and extrinsic abdominal muscle layers. Educated  patient on ways to begin engaging this deeper layer of muscles and explained some possible connection to other medical problems she has been treated for. Patient will benefit from skilled physical therapy intervention to reduce deficits and impairments identified in evaluation, in order to reduce pain, improve quality of life, and maximize activity tolerance for ADL, IADL, and leisure/fitness. Physical therapy will help pt achieve long and short term goals of care.     OBJECTIVE IMPAIRMENTS: decreased activity tolerance, decreased endurance, decreased knowledge of condition, decreased mobility, decreased ROM, decreased strength, increased muscle spasms, improper body mechanics, postural dysfunction, and prosthetic dependency .   ACTIVITY LIMITATIONS: carrying, lifting, bending, sleeping, transfers, bed mobility, continence, and toileting  PARTICIPATION LIMITATIONS: cleaning, laundry, interpersonal relationship, driving, community activity, occupation, and yard work  PERSONAL FACTORS: Age, Behavior pattern, Education, Fitness, Past/current experiences, and Profession are also affecting patient's functional outcome.   REHAB POTENTIAL: Good  CLINICAL DECISION MAKING: Evolving/moderate complexity  EVALUATION COMPLEXITY: Moderate   GOALS: Goals reviewed with patient? No  SHORT TERM GOALS: Target date: 08/19/23  Pt to perform all driving maneuvers pain free.  Baseline: pain with head turns (07/18/2023);  Goal status: In progress  2.  Deep neck flexor endurance of >25 seconds absent any HA.  Baseline: 11-15sec (07/18/2023);  Goal status: In progress  3.  Successful abdominal draw in maneuver >5sec in quadruped, short sitting, supine, and standing. Baseline: (07/18/2023);  Goal status: In progress  LONG TERM GOALS: Target date: 09/18/23  Ability to perform deadbug exercise without loss of lumbo pelvic isometric control.  Baseline: (07/18/2023);  Goal status: In progress  2.  Pt able to  perform ABD plank exercise x60sec without loss of spne curvature or change in hip flexion angle, absent pain.  Baseline: (07/18/2023);  Goal status: In progress  3.  NDI score of 0%.  Baseline: 14%  (07/18/2023);  Goal status: In progress  PLAN:  PT FREQUENCY: 1-2x/week  PT DURATION: 8 weeks  PLANNED INTERVENTIONS: 97110-Therapeutic exercises, 97530- Therapeutic activity, 97112- Neuromuscular re-education, 97535- Self Care, [id number]- Manual therapy, [id number]- Electrical stimulation (unattended), [phone]- Electrical stimulation (manual), Patient/Family education, Balance training, Stair training, Dry Needling, Joint mobilization, and Joint manipulation  PLAN FOR NEXT SESSION: Review HEP, advance as indicated. Trial joint mobilization Left cervical facets and repeat SNAGs  Luretha Murphy. Ilsa Iha, PT, DPT 07/24/23, 7:37 PM  [facility] [address] [place], [place] [address] P: [phone] I F: [phone]

## 2023-07-24 ENCOUNTER — Ambulatory Visit: Payer: 59 | Admitting: Physical Therapy

## 2023-07-24 ENCOUNTER — Encounter: Payer: Self-pay | Admitting: Physical Therapy

## 2023-07-24 DIAGNOSIS — M542 Cervicalgia: Secondary | ICD-10-CM

## 2023-07-24 DIAGNOSIS — M6208 Separation of muscle (nontraumatic), other site: Secondary | ICD-10-CM | POA: Diagnosis not present

## 2023-07-24 DIAGNOSIS — M62838 Other muscle spasm: Secondary | ICD-10-CM | POA: Diagnosis not present

## 2023-07-24 DIAGNOSIS — M6281 Muscle weakness (generalized): Secondary | ICD-10-CM

## 2023-07-31 ENCOUNTER — Ambulatory Visit: Payer: 59 | Attending: Family Medicine | Admitting: Physical Therapy

## 2023-07-31 ENCOUNTER — Encounter: Payer: Self-pay | Admitting: Physical Therapy

## 2023-07-31 DIAGNOSIS — M542 Cervicalgia: Secondary | ICD-10-CM | POA: Diagnosis not present

## 2023-07-31 DIAGNOSIS — M6281 Muscle weakness (generalized): Secondary | ICD-10-CM | POA: Diagnosis not present

## 2023-07-31 NOTE — Therapy (Signed)
 OUTPATIENT PHYSICAL THERAPY TREATMENT   Patient Name: Elizabeth Berger MRN: [id number] DOB:1991-04-24, 33 y.o., female Today's Date: 07/31/2023  END OF SESSION:  PT End of Session - 07/31/23 1954     Visit Number 3    Number of Visits 12    Date for PT Re-Evaluation 09/12/23    Authorization Type [place] [employer] Save reporting period from 07/18/2023    Progress Note Due on Visit 10    PT Start Time 1653    PT Stop Time 1731    PT Time Calculation (min) 38 min    Activity Tolerance Patient tolerated treatment well;No increased pain    Behavior During Therapy WFL for tasks assessed/performed               Past Medical History:  Diagnosis Date   Anxiety    Depression    FH: migraines    mom   Herpes    1&2 IgG+   Migraines    Past Surgical History:  Procedure Laterality Date   BREAST BIOPSY Left 12/18/2022   [place] bx mass, heart marker, path pending   BREAST BIOPSY Left 12/18/2022   [place] LT BREAST BX W LOC DEV 1ST LESION IMG BX SPEC US GUIDE 12/18/2022 [facility]   TONSILLECTOMY     Patient Active Problem List   Diagnosis Date Noted   Sinus tachycardia 06/18/2023   Late menses 05/19/2023   Overweight (BMI 25.0-29.9) 05/19/2023   Diastasis recti 05/19/2023   Cervical paraspinal muscle spasm 05/19/2023   Stress and adjustment reaction 12/15/2022   Periodic headache syndrome, not intractable 12/15/2022   Transient neurological symptoms 12/15/2022   Paroxysmal tachycardia (HCC) 10/12/2022   Generalized anxiety disorder with panic attacks 10/12/2022   Urgency of urination 07/31/2022   Avitaminosis D 07/31/2022   Annual physical exam 04/18/2019    PCP: Charlcie Cradle, FNP REFERRING PROVIDER: Sallee Provencal, FNP REFERRING DIAG: Neck pain; postpartum diastasis rectus abdominus   THERAPY DIAG:  Muscle weakness (generalized)  Cervicalgia Rationale for Evaluation and Treatment: Rehabilitation ONSET DATE: Diastasis Rectus (~2 years ago); Neck pain (1  years)   SUBJECTIVE:                                                                                                                                                                                                         PERTINENT HISTORY:  Elizabeth Berger is a 32yoF who is referred to OPPT for both chronic diastasis rectus abdominus problem (now 2 years postpartum) and a newer neck pain problem. Patient reports a restriction in  mobility, particularly when turning her neck to left. The pain, described as sharp and radiating from the neck to the shoulder, intensifies to a level of 10 upon reaching a certain point of movement.PMH: urinary retention, constipation, peripartum perineal trauma, migraine HA, dizziness. Pt also followed by cardiology for sinus tachycardia, palpitations, near syncope since pregnancy- pt also reports Covid-19 infection x4. Pt on BB therapy TID or as needed. Pt has also been working toward achieving health weight loss goal through diet and exercise. 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   Exercise history:  prior to pregnancy she was more active out of the home and is more sedentary now, but she did not exercise regularly. Marland Kitchen   She wants to be able to exercise more. She tries to look up exercises on youtube. She thinks she is able to exercise about 2 x a week for at least 30 minutes. She is limited when her heart races and it.She uses a heart rate monitor and it gets up to 160 and 170 bpm and she starts to feel dizzy (this happens a lot less than it did when she was freshly postpartum). There is not specific thing that makes her do it. She can stop before she passes out. She can work out when her child's dad is home and she can have a few minutes.   Pelvic floor specific questions:  During her labor she pushed for over 6 hours. She had perineal tearing with stitching. She has not had pelvic floor PT. She could not pee for the first few days (needed a catheter) and for several  months. She had to strain for several months to pee. She sometimes finds that urinates more volume than she was aware was in there but she no longer has to bear down. She does leak with sneezing but is always aware of it. She continues to have difficulty with constipation without an obvious cause. After birth she had really bad hemmhorids and something else. She had to have suppositories to bring the swelling down. Cannot get everything out. The stool is hard, dry, and pebbly. This has been a problem just after pregnancy. She does have trouble with pain with sex (about 80% of the time). Hurts with and without a lubricant. She does feel some heaviness when holding her daughter for a long time or going up stairs while holding her   SUBJECTIVE STATEMENT: Patient reports she is feeling well with no change in symptoms. Her HEP is going well. She felt like she could get through full ROM. Patient states she has noticed more tightness in her neck and shoulders since starting her new job and thinks it could be stress. She was patient facing and now she is not. She has more sitting still in the office than before. She states she mostly gets sharp pain in the left side of her neck when bending to the left or rotating left.   PAIN:  Are you having pain? 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   OBJECTIVE:   TREATMENT   Therapeutic exercise: therapeutic exercises that incorporate ONE parameter at one or more areas of the body to centralize symptoms, develop strength and endurance, range of motion, and flexibility required for successful completion of functional activities.  Standing cervical thoracic extension/BUE flexion and serratus anterior activation, lat stretch, with  foam roller up wall.  1x20  Hooklying Modified Pilates  100 with B LE at 90/90 position Several sets of short duration to learn exercise 2x20 seconds Attempted 30 seconds but too fatiguing at neck Added to HEP in place of curl up  Prone knee to elbows plank 4x30 seconds 30 second rests between sets More challenging at lower abs than pilates 100, neck pain less fatiguing.  Added to HEP  Neuromuscular Re-education: a technique or exercise performed with the goal of improving the level of communication between the body and the brain, such as for balance, motor control, muscle activation patterns, coordination, desensitization, quality of muscle contraction, proprioception, and/or kinesthetic sense needed for successful and safe completion of functional activities.   Standing scapular protraction with elbows against wall 1x10 to learn how to perform scapular protraction  Standing wall walks on the wall with isometric ER against TB at wrists 3x10 with YTB heavy cuing for improved form  Seated quick flick contraction of pelvic floor Several reps to understand exercise then 1x20 consecutively Added to HEP  Seated pelvic floor contraction hold  Patient unclear if she can hold for more than 1 second 1x5-8 with attempted 2 second hold Added cue to breathe out during contraction to facilitate contraction  Discontinued and provided patient with cold pack over back of neck when patient started to feel hot/lightheaded/nausea Pt feels this is temporary and 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 Relates it to being at the end of her period. Patient felt better with cold pack and extended seated rest  Added to HEP  Deep squat with arms supporting overhead on sink, breathing concentration on relaxing pelvic floor and lower abs when taking breaths.  1x~30 seconds (feels good in low back and better than happy baby pose).  Replaced happy baby exercise with this in HEP  Pt required multimodal cuing for proper technique and to facilitate improved  neuromuscular control, strength, range of motion, and functional ability resulting in improved performance and form.   PATIENT EDUCATION:  Education details: Exercise purpose/form. Self management techniques.  Education on HEP including handout. Person educated: Patient Education method: Explanation, Demonstration, Tactile cues, Verbal cues, and Handouts Education comprehension: verbalized understanding, returned demonstration, and needs further education  HOME EXERCISE PROGRAM: Access Code: [facility] URL: [email or web address] Date: 07/31/2023 Prepared by: Norton Blizzard  Exercises - Seated Transversus Abdominis Bracing  - 2 x daily - 3 sets - 10 reps - 3 hold - Seated Pelvic Floor Contraction  - 3 x daily - 1 sets - 10-20 reps - Seated Quick Flick Pelvic Floor Contractions  - 3 x daily - 1 sets - 20 reps - 0-1 seconds hold - Seated Pelvic Floor Contraction  - 3 x daily - 1 sets - 10 reps - 2 seconds hold - Deep Squat with Pelvic Floor Relaxation  - 3 x daily - 3-4 sets - 3-4 reps - 20-30 seconds hold - The Hundred 3 Intermediate - Table Top  - 1 x daily - 5 reps - 10-20 seconds hold - Plank on Knees  - 1 x daily - 5 reps - 30 seconds hold  ASSESSMENT:  CLINICAL IMPRESSION: Patient arrives with good confidence in completing HEP and ready to progress. Patient was able to progress exercises for neck pain and abdominal strength including deep core/pelvic floor. Patient did start to feel some symptoms consistent with dysautonomia near the end of the visit but she recovered with rest and a cold pack applied to her neck. Plan to consider  manual therapy at the neck possibly followed by SNAGs to improve neck pain next session. Also could consider repeated motions testing at the neck. Patient would benefit from continued management of limiting condition by skilled physical therapist to address remaining impairments and functional limitations to work towards stated goals and return to  PLOF or maximal functional independence.   From initial PT evaluation on 07/18/2023:  32yoF referred for chronic neck pain after insidious onset 1 year prior, correlates with left sided facet closure, particularly side bend and rotation, extension tolerated pain free, notable taut bands and trigger points in neck. Pt has signs of WNL hypermobility, author explained potential for cervical joint hyperextension during sleep phases which can result in joint irritation and protective spasm. Pt also shows limitations in deep cervical flexors which correlates well with her reports of neck fatigue toward end of day. Pt also complaining of abdominal motor weakness now 2 years post partum. Despite referral, do not see signs of true diastasis of abdominal wall, however pt does exhibit imbalance between intrinsic and extrinsic abdominal muscle layers. Educated patient on ways to begin engaging this deeper layer of muscles and explained some possible connection to other medical problems she has been treated for. Patient will benefit from skilled physical therapy intervention to reduce deficits and impairments identified in evaluation, in order to reduce pain, improve quality of life, and maximize activity tolerance for ADL, IADL, and leisure/fitness. Physical therapy will help pt achieve long and short term goals of care.     OBJECTIVE IMPAIRMENTS: decreased activity tolerance, decreased endurance, decreased knowledge of condition, decreased mobility, decreased ROM, decreased strength, increased muscle spasms, improper body mechanics, postural dysfunction, and prosthetic dependency .   ACTIVITY LIMITATIONS: carrying, lifting, bending, sleeping, transfers, bed mobility, continence, and toileting  PARTICIPATION LIMITATIONS: cleaning, laundry, interpersonal relationship, driving, community activity, occupation, and yard work  PERSONAL FACTORS: Age, Behavior pattern, Education, Fitness, Past/current experiences, and  Profession are also affecting patient's functional outcome.   REHAB POTENTIAL: Good  CLINICAL DECISION MAKING: Evolving/moderate complexity  EVALUATION COMPLEXITY: Moderate   GOALS: Goals reviewed with patient? No  SHORT TERM GOALS: Target date: 08/19/23  Pt to perform all driving maneuvers pain free.  Baseline: pain with head turns (07/18/2023);  Goal status: In progress  2.  Deep neck flexor endurance of >25 seconds absent any HA.  Baseline: 11-15sec (07/18/2023);  Goal status: In progress  3.  Successful abdominal draw in maneuver >5sec in quadruped, short sitting, supine, and standing. Baseline: (07/18/2023);  Goal status: In progress  LONG TERM GOALS: Target date: 09/18/23  Ability to perform deadbug exercise without loss of lumbo pelvic isometric control.  Baseline: (07/18/2023);  Goal status: In progress  2.  Pt able to perform ABD plank exercise x60sec without loss of spne curvature or change in hip flexion angle, absent pain.  Baseline: (07/18/2023);  Goal status: In progress  3.  NDI score of 0%.  Baseline: 14%  (07/18/2023);  Goal status: In progress  PLAN:  PT FREQUENCY: 1-2x/week  PT DURATION: 8 weeks  PLANNED INTERVENTIONS: 97110-Therapeutic exercises, 97530- Therapeutic activity, 97112- Neuromuscular re-education, 97535- Self Care, 16109- Manual therapy, G0283- Electrical stimulation (unattended), [phone]- Electrical stimulation (manual), Patient/Family education, Balance training, Stair training, Dry Needling, Joint mobilization, and Joint manipulation  PLAN FOR NEXT SESSION: Review HEP, advance as indicated. Trial joint mobilization Left cervical facets and repeat SNAGs  Luretha Murphy. Ilsa Iha, PT, DPT 07/31/23, 8:09 PM  [facility] [address] [place],  [place] [address] P: [phone] I F: [phone]

## 2023-08-02 ENCOUNTER — Ambulatory Visit: Payer: 59 | Admitting: Physical Therapy

## 2023-08-02 ENCOUNTER — Encounter: Payer: Self-pay | Admitting: Physical Therapy

## 2023-08-02 DIAGNOSIS — M542 Cervicalgia: Secondary | ICD-10-CM

## 2023-08-02 DIAGNOSIS — M6281 Muscle weakness (generalized): Secondary | ICD-10-CM

## 2023-08-02 NOTE — Therapy (Signed)
 OUTPATIENT PHYSICAL THERAPY TREATMENT   Patient Name: Elizabeth Berger MRN: [id number] DOB:Dec 15, 1990, 33 y.o., female Today's Date: 08/02/2023  END OF SESSION:  PT End of Session - 08/02/23 1941     Visit Number 4    Number of Visits 12    Date for PT Re-Evaluation 09/12/23    Authorization Type [place] [employer] Save reporting period from 07/18/2023    Progress Note Due on Visit 10    PT Start Time 1645    PT Stop Time 1730    PT Time Calculation (min) 45 min    Activity Tolerance Patient tolerated treatment well;No increased pain    Behavior During Therapy WFL for tasks assessed/performed                Past Medical History:  Diagnosis Date   Anxiety    Depression    FH: migraines    mom   Herpes    1&2 IgG+   Migraines    Past Surgical History:  Procedure Laterality Date   BREAST BIOPSY Left 12/18/2022   [place] bx mass, heart marker, path pending   BREAST BIOPSY Left 12/18/2022   [place] LT BREAST BX W LOC DEV 1ST LESION IMG BX SPEC US GUIDE 12/18/2022 [facility]   TONSILLECTOMY     Patient Active Problem List   Diagnosis Date Noted   Sinus tachycardia 06/18/2023   Late menses 05/19/2023   Overweight (BMI 25.0-29.9) 05/19/2023   Diastasis recti 05/19/2023   Cervical paraspinal muscle spasm 05/19/2023   Stress and adjustment reaction 12/15/2022   Periodic headache syndrome, not intractable 12/15/2022   Transient neurological symptoms 12/15/2022   Paroxysmal tachycardia (HCC) 10/12/2022   Generalized anxiety disorder with panic attacks 10/12/2022   Urgency of urination 07/31/2022   Avitaminosis D 07/31/2022   Annual physical exam 04/18/2019    PCP: Charlcie Cradle, FNP REFERRING PROVIDER: Sallee Provencal, FNP REFERRING DIAG: Neck pain; postpartum diastasis rectus abdominus   THERAPY DIAG:  Muscle weakness (generalized)  Cervicalgia Rationale for Evaluation and Treatment: Rehabilitation ONSET DATE: Diastasis Rectus (~2 years ago); Neck pain (1  years)   SUBJECTIVE:                                                                                                                                                                                                         PERTINENT HISTORY:  Elizabeth Berger is a 33yoF who is referred to OPPT for both chronic diastasis rectus abdominus problem (now 2 years postpartum) and a newer neck pain problem. Patient reports a restriction  in mobility, particularly when turning her neck to left. The pain, described as sharp and radiating from the neck to the shoulder, intensifies to a level of 10 upon reaching a certain point of movement.PMH: urinary retention, constipation, peripartum perineal trauma, migraine HA, dizziness. Pt also followed by cardiology for sinus tachycardia, palpitations, near syncope since pregnancy- pt also reports Covid-19 infection x4. Pt on BB therapy TID or as needed. Pt has also been working toward achieving health weight loss goal through diet and exercise. 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   Exercise history:  prior to pregnancy she was more active out of the home and is more sedentary now, but she did not exercise regularly. Marland Kitchen   She wants to be able to exercise more. She tries to look up exercises on youtube. She thinks she is able to exercise about 2 x a week for at least 30 minutes. She is limited when her heart races and it.She uses a heart rate monitor and it gets up to 160 and 170 bpm and she starts to feel dizzy (this happens a lot less than it did when she was freshly postpartum). There is not specific thing that makes her do it. She can stop before she passes out. She can work out when her child's dad is home and she can have a few minutes.   Pelvic floor specific questions:  During her labor she pushed for over 6 hours. She had perineal tearing with stitching. She has not had pelvic floor PT. She could not pee for the first few days (needed a catheter) and for several  months. She had to strain for several months to pee. She sometimes finds that urinates more volume than she was aware was in there but she no longer has to bear down. She does leak with sneezing but is always aware of it. She continues to have difficulty with constipation without an obvious cause. After birth she had really bad hemmhorids and something else. She had to have suppositories to bring the swelling down. Cannot get everything out. The stool is hard, dry, and pebbly. This has been a problem just after pregnancy. She does have trouble with pain with sex (about 80% of the time). Hurts with and without a lubricant. She does feel some heaviness when holding her daughter for a long time or going up stairs while holding her   SUBJECTIVE STATEMENT: Patient states yesterday she and her child took a slide down the stairs. Patient took the brunt of it and was sore  last night in the left neck where she usually has neck pain. Her abs were sore after last PT session.   PAIN:  Are you having pain? 10/10 at left side of neck when in left sidebending, 0/10 when not moving neck.   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   OBJECTIVE:   TREATMENT   Therapeutic exercise: therapeutic exercises that incorporate ONE parameter at one or more areas of the body to centralize symptoms, develop strength and endurance, range of motion, and flexibility required for successful completion of functional activities.  Standing wall walks on the wall with isometric ER against TB at wrists 3x15 with YTB Good carry over  Cervical spine AROM including asterisk sign (left side bending with right rotation) periodically during session to check effectiveness of intervention.  Seated with towel roll supporting lumbar spine:  Cervical spine  retraction AROM 1x10 No effect Cervical spine retraction with self overpressure (OP) 1x10 No effect  Seated L OA self mobilization 1x10 with 5 second holds Added to HEP  Seated rotational SNAG  1x10 each way Feels end range discomfort both directions No improvement/worsening  Manual therapy: to reduce pain and tissue tension, improve range of motion, neuromodulation, in order to promote improved ability to complete functional activities. SEATED with towel roll supporting lumbar spine Cervical retraction with clinician OP 1x6 Increased, no worse   PRONE  L UPA over C3-C6, grade III-IV, 1x30 seconds each segement Concordant pain, but no improvement after  HOOKLYING/SUPINE Opening joint mobilizations along segments of cervical spine to both directions, grade II-IV Painful at hand contact Suboccipital release L OA joint mobilization grade III, 1x10 Suboccipital contract-release stretch, 2 cycles  Localization of pain to left suboccipital region and increased ROM following supine manual therapy.   Pt required multimodal cuing for proper technique and to facilitate improved neuromuscular control, strength, range of motion, and functional ability resulting in improved performance and form.   PATIENT EDUCATION:  Education details: Exercise purpose/form. Self management techniques.  Education on HEP including handout. Person educated: Patient Education method: Explanation, Demonstration, Tactile cues, Verbal cues, and Handouts Education comprehension: verbalized understanding, returned demonstration, and needs further education  HOME EXERCISE PROGRAM: Access Code: [facility] URL: [email or web address] Date: 07/31/2023 Prepared by: Norton Blizzard  Exercises - Seated Transversus Abdominis Bracing  - 2 x daily - 3 sets - 10 reps - 3 hold - Seated Pelvic Floor Contraction  - 3 x daily - 1 sets - 10-20 reps - Seated Quick Flick Pelvic Floor Contractions  - 3 x daily -  1 sets - 20 reps - 0-1 seconds hold - Seated Pelvic Floor Contraction  - 3 x daily - 1 sets - 10 reps - 2 seconds hold - Deep Squat with Pelvic Floor Relaxation  - 3 x daily - 3-4 sets - 3-4 reps - 20-30 seconds hold - The Hundred 3 Intermediate - Table Top  - 1 x daily - 5 reps - 10-20 seconds hold - Plank on Knees  - 1 x daily - 5 reps - 30 seconds hold  HOME EXERCISE PROGRAM [[id number]] View at [email or web address] using code [email or web address] Suboccipital Stretch and C1-C2 Mobilization -  Repeat 20 Repetitions, Hold 2 Seconds, Perform 3 Times a Day  ASSESSMENT:  CLINICAL IMPRESSION: Patient arrives reporting successful participation in HEP. Today's session focused on trying to improve neck pain. She had concordant pain at upper cervical spine with left sidebending and right rotation which suggests upper cervical segments. She was TTP at left suboccipital muscles and may benefit from dry needling there next session. She was also tender to UPA along L C3-C6. She had localization of pain and slightly improved motion following manual therapy in the lying position. She had no significant response to repeated motions in sagittal plane. HEP updated to include suboccipital stretch. Patient would benefit from continued management of limiting condition by skilled physical therapist to address remaining impairments and functional limitations to work towards stated goals and return to PLOF or maximal functional independence.   From initial PT evaluation on 07/18/2023:  32yoF referred for chronic neck pain after insidious onset 1 year prior, correlates with left sided facet closure, particularly side bend and rotation, extension tolerated pain free, notable taut bands and trigger points in neck. Pt has signs of WNL hypermobility, author explained potential for  cervical joint hyperextension during sleep phases which can result in joint irritation and protective spasm. Pt also shows limitations in deep cervical flexors  which correlates well with her reports of neck fatigue toward end of day. Pt also complaining of abdominal motor weakness now 2 years post partum. Despite referral, do not see signs of true diastasis of abdominal wall, however pt does exhibit imbalance between intrinsic and extrinsic abdominal muscle layers. Educated patient on ways to begin engaging this deeper layer of muscles and explained some possible connection to other medical problems she has been treated for. Patient will benefit from skilled physical therapy intervention to reduce deficits and impairments identified in evaluation, in order to reduce pain, improve quality of life, and maximize activity tolerance for ADL, IADL, and leisure/fitness. Physical therapy will help pt achieve long and short term goals of care.     OBJECTIVE IMPAIRMENTS: decreased activity tolerance, decreased endurance, decreased knowledge of condition, decreased mobility, decreased ROM, decreased strength, increased muscle spasms, improper body mechanics, postural dysfunction, and prosthetic dependency .   ACTIVITY LIMITATIONS: carrying, lifting, bending, sleeping, transfers, bed mobility, continence, and toileting  PARTICIPATION LIMITATIONS: cleaning, laundry, interpersonal relationship, driving, community activity, occupation, and yard work  PERSONAL FACTORS: Age, Behavior pattern, Education, Fitness, Past/current experiences, and Profession are also affecting patient's functional outcome.   REHAB POTENTIAL: Good  CLINICAL DECISION MAKING: Evolving/moderate complexity  EVALUATION COMPLEXITY: Moderate   GOALS: Goals reviewed with patient? No  SHORT TERM GOALS: Target date: 08/19/23  Pt to perform all driving maneuvers pain free.  Baseline: pain with head turns (07/18/2023);  Goal status: In progress  2.  Deep neck flexor endurance of >25 seconds absent any HA.  Baseline: 11-15sec (07/18/2023);  Goal status: In progress  3.  Successful abdominal draw in  maneuver >5sec in quadruped, short sitting, supine, and standing. Baseline: (07/18/2023);  Goal status: In progress  LONG TERM GOALS: Target date: 09/18/23  Ability to perform deadbug exercise without loss of lumbo pelvic isometric control.  Baseline: (07/18/2023);  Goal status: In progress  2.  Pt able to perform ABD plank exercise x60sec without loss of spne curvature or change in hip flexion angle, absent pain.  Baseline: (07/18/2023);  Goal status: In progress  3.  NDI score of 0%.  Baseline: 14%  (07/18/2023);  Goal status: In progress  PLAN:  PT FREQUENCY: 1-2x/week  PT DURATION: 8 weeks  PLANNED INTERVENTIONS: 97110-Therapeutic exercises, 97530- Therapeutic activity, 97112- Neuromuscular re-education, 97535- Self Care, 47829- Manual therapy, G0283- Electrical stimulation (unattended), [id number]- Electrical stimulation (manual), Patient/Family education, Balance training, Stair training, Dry Needling, Joint mobilization, and Joint manipulation  PLAN FOR NEXT SESSION: Review HEP, advance as indicated. Trial joint mobilization Left cervical facets and repeat SNAGs  Elizabeth Berger, PT, DPT 08/02/23, 8:02 PM  [facility] [address] [place], [place] [address] P: [phone] I F: [phone]

## 2023-08-07 ENCOUNTER — Ambulatory Visit: Payer: 59 | Admitting: Physical Therapy

## 2023-08-09 ENCOUNTER — Encounter: Payer: Self-pay | Admitting: Physical Therapy

## 2023-08-09 ENCOUNTER — Ambulatory Visit: Payer: 59 | Admitting: Physical Therapy

## 2023-08-09 DIAGNOSIS — M6281 Muscle weakness (generalized): Secondary | ICD-10-CM

## 2023-08-09 DIAGNOSIS — M542 Cervicalgia: Secondary | ICD-10-CM

## 2023-08-09 NOTE — Therapy (Signed)
 OUTPATIENT PHYSICAL THERAPY TREATMENT   Patient Name: Elizabeth Berger MRN: [id number] DOB:18-Jan-1991, 33 y.o., female Today's Date: 08/09/2023  END OF SESSION:  PT End of Session - 08/09/23 1745     Visit Number 5    Number of Visits 12    Date for PT Re-Evaluation 09/12/23    Authorization Type [facility] [employer] Save reporting period from 07/18/2023    Progress Note Due on Visit 10    PT Start Time 1651    PT Stop Time 1729    PT Time Calculation (min) 38 min    Activity Tolerance Patient tolerated treatment well    Behavior During Therapy [facility] for tasks assessed/performed                 Past Medical History:  Diagnosis Date   Anxiety    Depression    FH: migraines    mom   Herpes    1&2 IgG+   Migraines    Past Surgical History:  Procedure Laterality Date   BREAST BIOPSY Left 12/18/2022   [place] bx mass, heart marker, path pending   BREAST BIOPSY Left 12/18/2022   [place] LT BREAST BX W LOC DEV 1ST LESION IMG BX SPEC US GUIDE 12/18/2022 [facility]   TONSILLECTOMY     Patient Active Problem List   Diagnosis Date Noted   Sinus tachycardia 06/18/2023   Late menses 05/19/2023   Overweight (BMI 25.0-29.9) 05/19/2023   Diastasis recti 05/19/2023   Cervical paraspinal muscle spasm 05/19/2023   Stress and adjustment reaction 12/15/2022   Periodic headache syndrome, not intractable 12/15/2022   Transient neurological symptoms 12/15/2022   Paroxysmal tachycardia (HCC) 10/12/2022   Generalized anxiety disorder with panic attacks 10/12/2022   Urgency of urination 07/31/2022   Avitaminosis D 07/31/2022   Annual physical exam 04/18/2019    PCP: Charlcie Cradle, FNP REFERRING PROVIDER: Sallee Provencal, FNP REFERRING DIAG: Neck pain; postpartum diastasis rectus abdominus   THERAPY DIAG:  Muscle weakness (generalized)  Cervicalgia Rationale for Evaluation and Treatment: Rehabilitation ONSET DATE: Diastasis Rectus (~2 years ago); Neck pain (1 years)    SUBJECTIVE:                                                                                                                                                                                                         PERTINENT HISTORY:  Elizabeth Berger is a 32yoF who is referred to OPPT for both chronic diastasis rectus abdominus problem (now 2 years postpartum) and a newer neck pain problem. Patient reports a restriction in  mobility, particularly when turning her neck to left. The pain, described as sharp and radiating from the neck to the shoulder, intensifies to a level of 10 upon reaching a certain point of movement.PMH: urinary retention, constipation, peripartum perineal trauma, migraine HA, dizziness. Pt also followed by cardiology for sinus tachycardia, palpitations, near syncope since pregnancy- pt also reports Covid-19 infection x4. Pt on BB therapy TID or as needed. Pt has also been working toward achieving health weight loss goal through diet and exercise. 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   Exercise history:  prior to pregnancy she was more active out of the home and is more sedentary now, but she did not exercise regularly. Marland Kitchen   She wants to be able to exercise more. She tries to look up exercises on youtube. She thinks she is able to exercise about 2 x a week for at least 30 minutes. She is limited when her heart races and it.She uses a heart rate monitor and it gets up to 160 and 170 bpm and she starts to feel dizzy (this happens a lot less than it did when she was freshly postpartum). There is not specific thing that makes her do it. She can stop before she passes out. She can work out when her child's dad is home and she can have a few minutes.   Pelvic floor specific questions:  During her labor she pushed for over 6 hours. She had perineal tearing with stitching. She has not had pelvic floor PT. She could not pee for the first few days (needed a catheter) and for several months. She  had to strain for several months to pee. She sometimes finds that urinates more volume than she was aware was in there but she no longer has to bear down. She does leak with sneezing but is always aware of it. She continues to have difficulty with constipation without an obvious cause. After birth she had really bad hemmhorids and something else. She had to have suppositories to bring the swelling down. Cannot get everything out. The stool is hard, dry, and pebbly. This has been a problem just after pregnancy. She does have trouble with pain with sex (about 80% of the time). Hurts with and without a lubricant. She does feel some heaviness when holding her daughter for a long time or going up stairs while holding her   SUBJECTIVE STATEMENT: Patient states she had a headache on both sides of her head for a day or two after last PT session, but she does not have it currently. She missed last PT session due to her heart rate being too high but is feeling better now. Her neck is the same except she noticed yesterday she turned her head maximally to the left and had a sudden pinch in the neck, which is worse than what usually happens to her.   PAIN:  Are you having pain? 8/10 at left side of neck when in left sidebending, 0/10 when not moving neck.   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   OBJECTIVE:   TREATMENT   Therapeutic exercise: therapeutic exercises that incorporate ONE parameter at one or more areas of the body to centralize symptoms, develop strength and endurance, range of motion, and flexibility required for successful completion of functional activities.  Cervical spine AROM including asterisk sign (  left side bending with right rotation) periodically during session to check effectiveness of intervention.  Seated  L OA self mobilization 1x10 with 5 second holds Worse afterwards Discontinued  Seated rotational SNAG  1x5 to right (feels like it has no effect, discontinued) 1x10-15 to left (feels end range tightness at end range) No improvement/worsening HEP updated to L only  Seated Neck Rotation and Nod- capital, atlanto occipital mobilization 1x10 to the right (no change or worse) 2x10 to the left Very slightly better after first set, then no better Added to HEP  Seated Suboccipital and Economist (flexion-rotation) 1x10 each side with self overpressure No improvement or worse  Manual therapy: to reduce pain and tissue tension, improve range of motion, neuromodulation, in order to promote improved ability to complete functional activities.  PRONE  STM to bilateral suboccipital and parascapular muscles   Pt required multimodal cuing for proper technique and to facilitate improved neuromuscular control, strength, range of motion, and functional ability resulting in improved performance and form.   Trigger Point Dry Needling  Initial Treatment: Pt instructed on Dry Needling rational, procedures, and possible side effects. Pt instructed to expect mild to moderate muscle soreness later in the day and/or into the next day.  Pt instructed in methods to reduce muscle soreness. Pt instructed to continue prescribed HEP. Because Dry Needling was performed over or adjacent to a lung field, pt was educated on S/S of pneumothorax and to seek immediate medical attention should they occur.  Patient was educated on signs and symptoms of infection and other risk factors and advised to seek medical attention should they occur.  Patient verbalized understanding of these instructions and education.  Patient Verbal Consent Given: Yes Education Handout Provided: Previously Provided Muscles Treated: 4 dry needle(s) .25mm x 30mm inserted into bilateral suboccipital muscle regions and into  bilateral multifidi at C3 and C4 levels with patient in prone position to decrease pain and spasms along patient's head and neck region. Electrical Stimulation Performed: No Treatment Response/Outcome: Concordant pain moved more caudally after suboccipital needling, then no change after multifidi needling, although pt reported it hit the concordant spot when needle was inserted at left multifidi at approx C3. Patient with expected generalized soreness following needling. No abnormal response.     PATIENT EDUCATION:  Education details: Exercise purpose/form. Self management techniques.  Education on HEP including handout. Person educated: Patient Education method: Explanation, Demonstration, Tactile cues, Verbal cues, and Handouts Education comprehension: verbalized understanding, returned demonstration, and needs further education  HOME EXERCISE PROGRAM: Access Code: [facility] URL: [email or web address] Date: 07/31/2023 Prepared by: Norton Blizzard  Exercises - Seated Transversus Abdominis Bracing  - 2 x daily - 3 sets - 10 reps - 3 hold - Seated Pelvic Floor Contraction  - 3 x daily - 1 sets - 10-20 reps - Seated Quick Flick Pelvic Floor Contractions  - 3 x daily - 1 sets - 20 reps - 0-1 seconds hold - Seated Pelvic Floor Contraction  - 3 x daily - 1 sets - 10 reps - 2 seconds hold - Deep Squat with Pelvic Floor Relaxation  - 3 x daily - 3-4 sets - 3-4 reps - 20-30 seconds hold - The Hundred 3 Intermediate - Table Top  - 1 x daily - 5 reps - 10-20 seconds hold - Plank on Knees  - 1 x daily - 5 reps - 30 seconds hold  HOME EXERCISE PROGRAM [[id number]] View at [email or web address] using code [id number] Neck Rotation and Nod- capital, atlanto occipital  mobilization  -  Repeat 20 Repetitions, Hold 1 Second(s), Complete 1 Set, Perform 3 Times a Day  ASSESSMENT:  CLINICAL IMPRESSION: Patient with neck pain no better since last PT session. Dry needling performed today at bilateral  suboccipitals and upper multifidi (C3 and C4). Concordant pain moved more caudally after suboccipital needling, then no change after multifidi needling, although pt reported it hit the concordant spot when needle was inserted at left multifidi at approx C3. Patient with expected generalized soreness following needling. Continued with repeated end range motions for upper cervical spine region without expected success. Patient points with one finger to pain at left suboccipital region, but PT does not find a structure to mobilize or stretch that changes with manual or exercises techniques so far. Plan to continue working to isolate pain and modify it through PT interventions. Also plan to check on progress with core conditioning next session as well. Patient would benefit from continued management of limiting condition by skilled physical therapist to address remaining impairments and functional limitations to work towards stated goals and return to PLOF or maximal functional independence.   From initial PT evaluation on 07/18/2023:  32yoF referred for chronic neck pain after insidious onset 1 year prior, correlates with left sided facet closure, particularly side bend and rotation, extension tolerated pain free, notable taut bands and trigger points in neck. Pt has signs of WNL hypermobility, author explained potential for cervical joint hyperextension during sleep phases which can result in joint irritation and protective spasm. Pt also shows limitations in deep cervical flexors which correlates well with her reports of neck fatigue toward end of day. Pt also complaining of abdominal motor weakness now 2 years post partum. Despite referral, do not see signs of true diastasis of abdominal wall, however pt does exhibit imbalance between intrinsic and extrinsic abdominal muscle layers. Educated patient on ways to begin engaging this deeper layer of muscles and explained some possible connection to other medical  problems she has been treated for. Patient will benefit from skilled physical therapy intervention to reduce deficits and impairments identified in evaluation, in order to reduce pain, improve quality of life, and maximize activity tolerance for ADL, IADL, and leisure/fitness. Physical therapy will help pt achieve long and short term goals of care.     OBJECTIVE IMPAIRMENTS: decreased activity tolerance, decreased endurance, decreased knowledge of condition, decreased mobility, decreased ROM, decreased strength, increased muscle spasms, improper body mechanics, postural dysfunction, and prosthetic dependency .   ACTIVITY LIMITATIONS: carrying, lifting, bending, sleeping, transfers, bed mobility, continence, and toileting  PARTICIPATION LIMITATIONS: cleaning, laundry, interpersonal relationship, driving, community activity, occupation, and yard work  PERSONAL FACTORS: Age, Behavior pattern, Education, Fitness, Past/current experiences, and Profession are also affecting patient's functional outcome.   REHAB POTENTIAL: Good  CLINICAL DECISION MAKING: Evolving/moderate complexity  EVALUATION COMPLEXITY: Moderate   GOALS: Goals reviewed with patient? No  SHORT TERM GOALS: Target date: 08/19/23  Pt to perform all driving maneuvers pain free.  Baseline: pain with head turns (07/18/2023);  Goal status: In progress  2.  Deep neck flexor endurance of >25 seconds absent any HA.  Baseline: 11-15sec (07/18/2023);  Goal status: In progress  3.  Successful abdominal draw in maneuver >5sec in quadruped, short sitting, supine, and standing. Baseline: (07/18/2023);  Goal status: In progress  LONG TERM GOALS: Target date: 09/18/23  Ability to perform deadbug exercise without loss of lumbo pelvic isometric control.  Baseline: (07/18/2023);  Goal status: In progress  2.  Pt able to perform  ABD plank exercise x60sec without loss of spne curvature or change in hip flexion angle, absent pain.  Baseline:  (07/18/2023);  Goal status: In progress  3.  NDI score of 0%.  Baseline: 14%  (07/18/2023);  Goal status: In progress  PLAN:  PT FREQUENCY: 1-2x/week  PT DURATION: 8 weeks  PLANNED INTERVENTIONS: 97110-Therapeutic exercises, 97530- Therapeutic activity, 97112- Neuromuscular re-education, 97535- Self Care, 16109- Manual therapy, G0283- Electrical stimulation (unattended), [phone]- Electrical stimulation (manual), Patient/Family education, Balance training, Stair training, Dry Needling, Joint mobilization, and Joint manipulation  PLAN FOR NEXT SESSION: Review HEP, advance as indicated. Progressive interventions for neck and core as appropriate.   Luretha Murphy. Ilsa Iha, PT, DPT 08/09/23, 8:35 PM  [facility] [address] [place], [place] [address] P: [phone] I F: [phone]

## 2023-08-14 ENCOUNTER — Ambulatory Visit: Payer: 59 | Admitting: Physical Therapy

## 2023-08-14 DIAGNOSIS — M542 Cervicalgia: Secondary | ICD-10-CM | POA: Diagnosis not present

## 2023-08-14 DIAGNOSIS — M6281 Muscle weakness (generalized): Secondary | ICD-10-CM | POA: Diagnosis not present

## 2023-08-14 NOTE — Patient Instructions (Signed)
   Review the following video for the bracing during squats that I was teaching you:  [email or web address]

## 2023-08-14 NOTE — Therapy (Unsigned)
 OUTPATIENT PHYSICAL THERAPY TREATMENT   Patient Name: Elizabeth Berger MRN: [id number] DOB:December 16, 1990, 33 y.o., female Today's Date: 08/15/2023  END OF SESSION:  PT End of Session - 08/15/23 0843     Visit Number 6    Number of Visits 12    Date for PT Re-Evaluation 09/12/23    Authorization Type [facility] [employer] Save reporting period from 07/18/2023    Progress Note Due on Visit 10    PT Start Time 1650    PT Stop Time 1730    PT Time Calculation (min) 40 min    Activity Tolerance Patient tolerated treatment well    Behavior During Therapy [facility] for tasks assessed/performed                  Past Medical History:  Diagnosis Date   Anxiety    Depression    FH: migraines    mom   Herpes    1&2 IgG+   Migraines    Past Surgical History:  Procedure Laterality Date   BREAST BIOPSY Left 12/18/2022   [place] bx mass, heart marker, path pending   BREAST BIOPSY Left 12/18/2022   [place] LT BREAST BX W LOC DEV 1ST LESION IMG BX SPEC US GUIDE 12/18/2022 [facility]   TONSILLECTOMY     Patient Active Problem List   Diagnosis Date Noted   Sinus tachycardia 06/18/2023   Late menses 05/19/2023   Overweight (BMI 25.0-29.9) 05/19/2023   Diastasis recti 05/19/2023   Cervical paraspinal muscle spasm 05/19/2023   Stress and adjustment reaction 12/15/2022   Periodic headache syndrome, not intractable 12/15/2022   Transient neurological symptoms 12/15/2022   Paroxysmal tachycardia (HCC) 10/12/2022   Generalized anxiety disorder with panic attacks 10/12/2022   Urgency of urination 07/31/2022   Avitaminosis D 07/31/2022   Annual physical exam 04/18/2019    PCP: Charlcie Cradle, FNP REFERRING PROVIDER: Sallee Provencal, FNP REFERRING DIAG: Neck pain; postpartum diastasis rectus abdominus   THERAPY DIAG:  Muscle weakness (generalized)  Cervicalgia Rationale for Evaluation and Treatment: Rehabilitation ONSET DATE: Diastasis Rectus (~2 years ago); Neck pain (1 years)    SUBJECTIVE:                                                                                                                                                                                                         PERTINENT HISTORY:  Elizabeth Berger is a 33yoF who is referred to OPPT for both chronic diastasis rectus abdominus problem (now 2 years postpartum) and a newer neck pain problem. Patient reports a restriction  in mobility, particularly when turning her neck to left. The pain, described as sharp and radiating from the neck to the shoulder, intensifies to a level of 10 upon reaching a certain point of movement.PMH: urinary retention, constipation, peripartum perineal trauma, migraine HA, dizziness. Pt also followed by cardiology for sinus tachycardia, palpitations, near syncope since pregnancy- pt also reports Covid-19 infection x4. Pt on BB therapy TID or as needed. Pt has also been working toward achieving health weight loss goal through diet and exercise. 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   Exercise history:  prior to pregnancy she was more active out of the home and is more sedentary now, but she did not exercise regularly. [name]   She wants to be able to exercise more. She tries to look up exercises on youtube. She thinks she is able to exercise about 2 x a week for at least 30 minutes. She is limited when her heart races and it.She uses a heart rate monitor and it gets up to 160 and 170 bpm and she starts to feel dizzy (this happens a lot less than it did when she was freshly postpartum). There is not specific thing that makes her do it. She can stop before she passes out. She can work out when her child's dad is home and she can have a few minutes.   Pelvic floor specific questions:  During her labor she pushed for over 6 hours. She had perineal tearing with stitching. She has not had pelvic floor PT. She could not pee for the first few days (needed a catheter) and for several months. She  had to strain for several months to pee. She sometimes finds that urinates more volume than she was aware was in there but she no longer has to bear down. She does leak with sneezing but is always aware of it. She continues to have difficulty with constipation without an obvious cause. After birth she had really bad hemmhorids and something else. She had to have suppositories to bring the swelling down. Cannot get everything out. The stool is hard, dry, and pebbly. This has been a problem just after pregnancy. She does have trouble with pain with sex (about 80% of the time). Hurts with and without a lubricant. She does feel some heaviness when holding her daughter for a long time or going up stairs while holding her   SUBJECTIVE STATEMENT: Patient states she has experienced no change in neck pain since last PT session, but the soreness from the dry needling was gone the next day. She states her planks are going well. She states she has not had any leaking recently including when startled or with coughs and sneezes. She does not find herself avoiding anything because of leaking. Her pelvic floor is feeling stronger. She is no longer having pain with sex. She has increased her exercise participating at home.   PAIN:  Are you having pain? 8-9/10 at left side of neck when in left sidebending, 0/10 when not moving neck.   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   OBJECTIVE:   TREATMENT   Therapeutic exercise: therapeutic exercises that incorporate ONE parameter at one or more areas of the body to centralize symptoms, develop strength and endurance, range of motion, and flexibility required for successful completion of functional activities.  Plank  5x30  seconds toes to elbows, then to knees when too fatigued  Cuing for  improved PPT and scapular protraction   Lateral plank from knees to elbow 3x20 seconds each side  Neuromuscular Re-education: a technique or exercise performed with the goal of improving the level of communication between the body and the brain, such as for balance, motor control, muscle activation patterns, coordination, desensitization, quality of muscle contraction, proprioception, and/or kinesthetic sense needed for successful and safe completion of functional activities.   Review of pelvic floor HEP to continue increasing hold time as able for endurance training. Also option to practice coughing voluntarily with a quick flick just prior or during cough.   Progression of diaphragmatic breathing to squat with abdominal brace, adding parts in a stepwise sequence progressing to 360 degree breathing, to adding glute squeeze with maintained abdominal drawing in with squat using LE rooting technique. Patient in cognitive phase of motor learning but able to do several fair to good reps.    Pt required multimodal cuing for proper technique and to facilitate improved neuromuscular control, strength, range of motion, and functional ability resulting in improved performance and form.    PATIENT EDUCATION:  Education details: Exercise purpose/form. Self management techniques.  Education on HEP including handout. Person educated: Patient Education method: Explanation, Demonstration, Tactile cues, Verbal cues, and Handouts Education comprehension: verbalized understanding, returned demonstration, and needs further education  HOME EXERCISE PROGRAM: Access Code: [facility] URL: [email or web address] Date: 07/31/2023 Prepared by: Alleen Isle  Exercises - Seated Transversus Abdominis Bracing  - 2 x daily - 3 sets - 10 reps - 3 hold - Seated Pelvic Floor Contraction  - 3 x daily - 1 sets - 10-20 reps - Seated Quick Flick Pelvic Floor Contractions  - 3 x daily - 1 sets - 20 reps - 0-1  seconds hold - Seated Pelvic Floor Contraction  - 3 x daily - 1 sets - 10 reps - 2 seconds hold - Deep Squat with Pelvic Floor Relaxation  - 3 x daily - 3-4 sets - 3-4 reps - 20-30 seconds hold - The Hundred 3 Intermediate - Table Top  - 1 x daily - 5 reps - 10-20 seconds hold - Plank on Knees  - 1 x daily - 5 reps - 30 seconds hold  HOME EXERCISE PROGRAM [[id number]] View at [email or web address] using code [id number] Neck Rotation and Nod- capital, atlanto occipital mobilization  -  Repeat 20 Repetitions, Hold 1 Second(s), Complete 1 Set, Perform 3 Times a Day  ASSESSMENT:  CLINICAL IMPRESSION: Focus of today's session on progressing core exercises today. Patient challenged appropriately with progressions and demonstrated good tolerance. HEP updated. Patient worked on motor control for breathing and deep core coordination for improved active lumbar support during squatting. Patient in cognitive phase of motor learning but able to do several fair to good reps. Added practice to HEP to help progress to associative and automatic phase. Patient would benefit from continued management of limiting condition by skilled physical therapist to address remaining impairments and functional limitations to work towards stated goals and return to PLOF or maximal functional independence.   From initial PT evaluation on 07/18/2023:  32yoF referred for chronic neck pain after insidious onset 1 year prior, correlates with left sided facet closure, particularly side bend and rotation, extension tolerated pain free, notable taut bands and trigger points in neck. Pt has signs of WNL hypermobility, author explained potential for cervical joint hyperextension during sleep phases which can result in joint irritation and protective spasm.  Pt also shows limitations in deep cervical flexors which correlates well with her reports of neck fatigue toward end of day. Pt also complaining of abdominal motor weakness now 2 years post  partum. Despite referral, do not see signs of true diastasis of abdominal wall, however pt does exhibit imbalance between intrinsic and extrinsic abdominal muscle layers. Educated patient on ways to begin engaging this deeper layer of muscles and explained some possible connection to other medical problems she has been treated for. Patient will benefit from skilled physical therapy intervention to reduce deficits and impairments identified in evaluation, in order to reduce pain, improve quality of life, and maximize activity tolerance for ADL, IADL, and leisure/fitness. Physical therapy will help pt achieve long and short term goals of care.     OBJECTIVE IMPAIRMENTS: decreased activity tolerance, decreased endurance, decreased knowledge of condition, decreased mobility, decreased ROM, decreased strength, increased muscle spasms, improper body mechanics, postural dysfunction, and prosthetic dependency .   ACTIVITY LIMITATIONS: carrying, lifting, bending, sleeping, transfers, bed mobility, continence, and toileting  PARTICIPATION LIMITATIONS: cleaning, laundry, interpersonal relationship, driving, community activity, occupation, and yard work  PERSONAL FACTORS: Age, Behavior pattern, Education, Fitness, Past/current experiences, and Profession are also affecting patient's functional outcome.   REHAB POTENTIAL: Good  CLINICAL DECISION MAKING: Evolving/moderate complexity  EVALUATION COMPLEXITY: Moderate   GOALS: Goals reviewed with patient? No  SHORT TERM GOALS: Target date: 08/19/23  Pt to perform all driving maneuvers pain free.  Baseline: pain with head turns (07/18/2023);  Goal status: In progress  2.  Deep neck flexor endurance of >25 seconds absent any HA.  Baseline: 11-15sec (07/18/2023);  Goal status: In progress  3.  Successful abdominal draw in maneuver >5sec in quadruped, short sitting, supine, and standing. Baseline: (07/18/2023);  Goal status: In progress  LONG TERM GOALS:  Target date: 09/18/23  Ability to perform deadbug exercise without loss of lumbo pelvic isometric control.  Baseline: (07/18/2023);  Goal status: In progress  2.  Pt able to perform ABD plank exercise x60sec without loss of spne curvature or change in hip flexion angle, absent pain.  Baseline: (07/18/2023);  Goal status: In progress  3.  NDI score of 0%.  Baseline: 14%  (07/18/2023);  Goal status: In progress  PLAN:  PT FREQUENCY: 1-2x/week  PT DURATION: 8 weeks  PLANNED INTERVENTIONS: 97110-Therapeutic exercises, 97530- Therapeutic activity, 97112- Neuromuscular re-education, 97535- Self Care, 40981- Manual therapy, G0283- Electrical stimulation (unattended), [phone]- Electrical stimulation (manual), Patient/Family education, Balance training, Stair training, Dry Needling, Joint mobilization, and Joint manipulation  PLAN FOR NEXT SESSION: Review HEP, advance as indicated. Progressive interventions for neck and core as appropriate.   Carilyn Charles. Artemio Larry, PT, DPT 08/15/23, 8:51 AM  [facility] [address] [place], [place] [address] P: [phone] I F: [phone]

## 2023-08-15 ENCOUNTER — Encounter: Payer: Self-pay | Admitting: Physical Therapy

## 2023-08-16 ENCOUNTER — Encounter: Payer: Self-pay | Admitting: Physical Therapy

## 2023-08-16 ENCOUNTER — Ambulatory Visit: Payer: 59 | Admitting: Physical Therapy

## 2023-08-16 DIAGNOSIS — M542 Cervicalgia: Secondary | ICD-10-CM | POA: Diagnosis not present

## 2023-08-16 DIAGNOSIS — M6281 Muscle weakness (generalized): Secondary | ICD-10-CM | POA: Diagnosis not present

## 2023-08-16 NOTE — Therapy (Signed)
 OUTPATIENT PHYSICAL THERAPY TREATMENT   Patient Name: Elizabeth Berger MRN: [id number] DOB:February 07, 1991, 33 y.o., female Today's Date: 08/16/2023  END OF SESSION:  PT End of Session - 08/16/23 1714     Visit Number 7    Number of Visits 12    Date for PT Re-Evaluation 09/12/23    Authorization Type [facility] [employer] Save reporting period from 07/18/2023    Progress Note Due on Visit 10    PT Start Time 1651    PT Stop Time 1727    PT Time Calculation (min) 36 min    Activity Tolerance Patient tolerated treatment well    Behavior During Therapy [facility] for tasks assessed/performed                   Past Medical History:  Diagnosis Date   Anxiety    Depression    FH: migraines    mom   Herpes    1&2 IgG+   Migraines    Past Surgical History:  Procedure Laterality Date   BREAST BIOPSY Left 12/18/2022   [place] bx mass, heart marker, path pending   BREAST BIOPSY Left 12/18/2022   [place] LT BREAST BX W LOC DEV 1ST LESION IMG BX SPEC US GUIDE 12/18/2022 [facility]   TONSILLECTOMY     Patient Active Problem List   Diagnosis Date Noted   Sinus tachycardia 06/18/2023   Late menses 05/19/2023   Overweight (BMI 25.0-29.9) 05/19/2023   Diastasis recti 05/19/2023   Cervical paraspinal muscle spasm 05/19/2023   Stress and adjustment reaction 12/15/2022   Periodic headache syndrome, not intractable 12/15/2022   Transient neurological symptoms 12/15/2022   Paroxysmal tachycardia (HCC) 10/12/2022   Generalized anxiety disorder with panic attacks 10/12/2022   Urgency of urination 07/31/2022   Avitaminosis D 07/31/2022   Annual physical exam 04/18/2019    PCP: Charlcie Cradle, FNP REFERRING PROVIDER: Sallee Provencal, FNP REFERRING DIAG: Neck pain; postpartum diastasis rectus abdominus   THERAPY DIAG:  Muscle weakness (generalized)  Cervicalgia Rationale for Evaluation and Treatment: Rehabilitation ONSET DATE: Diastasis Rectus (~2 years ago); Neck pain (1 years)    SUBJECTIVE:                                                                                                                                                                                                         PERTINENT HISTORY:  Elizabeth Berger is a 32yoF who is referred to OPPT for both chronic diastasis rectus abdominus problem (now 2 years postpartum) and a newer neck pain problem. Patient reports a  restriction in mobility, particularly when turning her neck to left. The pain, described as sharp and radiating from the neck to the shoulder, intensifies to a level of 10 upon reaching a certain point of movement.PMH: urinary retention, constipation, peripartum perineal trauma, migraine HA, dizziness. Pt also followed by cardiology for sinus tachycardia, palpitations, near syncope since pregnancy- pt also reports Covid-19 infection x4. Pt on BB therapy TID or as needed. Pt has also been working toward achieving health weight loss goal through diet and exercise. 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   Exercise history:  prior to pregnancy she was more active out of the home and is more sedentary now, but she did not exercise regularly. [name]   She wants to be able to exercise more. She tries to look up exercises on youtube. She thinks she is able to exercise about 2 x a week for at least 30 minutes. She is limited when her heart races and it.She uses a heart rate monitor and it gets up to 160 and 170 bpm and she starts to feel dizzy (this happens a lot less than it did when she was freshly postpartum). There is not specific thing that makes her do it. She can stop before she passes out. She can work out when her child's dad is home and she can have a few minutes.   Pelvic floor specific questions:  During her labor she pushed for over 6 hours. She had perineal tearing with stitching. She has not had pelvic floor PT. She could not pee for the first few days (needed a catheter) and for several months. She  had to strain for several months to pee. She sometimes finds that urinates more volume than she was aware was in there but she no longer has to bear down. She does leak with sneezing but is always aware of it. She continues to have difficulty with constipation without an obvious cause. After birth she had really bad hemmhorids and something else. She had to have suppositories to bring the swelling down. Cannot get everything out. The stool is hard, dry, and pebbly. This has been a problem just after pregnancy. She does have trouble with pain with sex (about 80% of the time). Hurts with and without a lubricant. She does feel some heaviness when holding her daughter for a long time or going up stairs while holding her   SUBJECTIVE STATEMENT: Patient states she is sore after last PT session in her legs and her abdominal muscles. She did not do any squats at home yet.   PAIN:  Are you having pain? 8/10 at left side of neck when in left sidebending, 0/10 when not moving neck.   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   OBJECTIVE:   TREATMENT   Therapeutic exercise: therapeutic exercises that incorporate ONE parameter at one or more areas of the body to centralize symptoms, develop strength and endurance, range of motion, and flexibility required for successful completion of functional activities.  Quadruped bird dog (alternating shoulder flexion/contralateral hip extension with core muscles braced).  1x10 each side  Superset:  Quadruped single arm shoulder horizontal abduction with active protraction of support UE.  1x10 each side with 2#DB 1x10 each side with 5#DB Struggling with weight some (try 4# next time).   Quadruped single arm shoulder scaption with active  protraction of support UE.  1x10 each side with  2#DB 1x10 each side with 3#DB  Hooklying pilates 100 with feet at table top position 3x100 pumps with short breaks as needed  Able to perform 2nd and 3rd sets all the way through  Seated lat pull down at [facility] machine 2x15 with 35#  Seated chest press at [facility] machine 2x15 with 15#  Seated row at [facility] machine 2x15 with 25#  Standing pallof press 2x10 each side with 5# cable (10# was too easy)  Neuromuscular Re-education: a technique or exercise performed with the goal of improving the level of communication between the body and the brain, such as for balance, motor control, muscle activation patterns, coordination, desensitization, quality of muscle contraction, proprioception, and/or kinesthetic sense needed for successful and safe completion of functional activities.   Squats with focus on sequencing for abdominal brace  3x10 Cuing for sequencing  Pt required multimodal cuing for proper technique and to facilitate improved neuromuscular control, strength, range of motion, and functional ability resulting in improved performance and form.    PATIENT EDUCATION:  Education details: Exercise purpose/form. Self management techniques.  Education on HEP including handout. Person educated: Patient Education method: Explanation, Demonstration, Tactile cues, Verbal cues, and Handouts Education comprehension: verbalized understanding, returned demonstration, and needs further education  HOME EXERCISE PROGRAM: Access Code: [facility] URL: [email or web address] Date: 07/31/2023 Prepared by: Norton Blizzard  Exercises - Seated Transversus Abdominis Bracing  - 2 x daily - 3 sets - 10 reps - 3 hold - Seated Pelvic Floor Contraction  - 3 x daily - 1 sets - 10-20 reps - Seated Quick Flick Pelvic Floor Contractions  - 3 x daily - 1 sets - 20 reps - 0-1 seconds hold - Seated Pelvic Floor Contraction  - 3 x daily - 1 sets - 10 reps - 2 seconds hold - Deep Squat with Pelvic Floor  Relaxation  - 3 x daily - 3-4 sets - 3-4 reps - 20-30 seconds hold - The Hundred 3 Intermediate - Table Top  - 1 x daily - 5 reps - 10-20 seconds hold - Plank on Knees  - 1 x daily - 5 reps - 30 seconds hold  HOME EXERCISE PROGRAM [[id number]] View at [email or web address] using code [id number] Neck Rotation and Nod- capital, atlanto occipital mobilization  -  Repeat 20 Repetitions, Hold 1 Second(s), Complete 1 Set, Perform 3 Times a Day  ASSESSMENT:  CLINICAL IMPRESSION: Patient sore from last PT session, so repeated squatting exercise to help improve adaptation and prevent further DOMS next week. Started more focused strengthening for shoulder girdle and in positions that challenge neck and core to improve neck pain and function. Patient tolerated well except feeling uncomfortable in work clothing while sweating. She was appropriately challenged and educed on potential DOMS for the new muscle groups worked. Patient would benefit from continued management of limiting condition by skilled physical therapist to address remaining impairments and functional limitations to work towards stated goals and return to PLOF or maximal functional independence.   From initial PT evaluation on 07/18/2023:  32yoF referred for chronic neck pain after insidious onset 1 year prior, correlates with left sided facet closure, particularly side bend and rotation, extension tolerated pain free, notable taut bands and trigger points in neck. Pt has signs of WNL hypermobility, author explained potential for cervical joint hyperextension during sleep phases which can result in joint irritation and protective spasm. Pt also shows limitations in deep cervical flexors which correlates well with her  reports of neck fatigue toward end of day. Pt also complaining of abdominal motor weakness now 2 years post partum. Despite referral, do not see signs of true diastasis of abdominal wall, however pt does exhibit imbalance between intrinsic  and extrinsic abdominal muscle layers. Educated patient on ways to begin engaging this deeper layer of muscles and explained some possible connection to other medical problems she has been treated for. Patient will benefit from skilled physical therapy intervention to reduce deficits and impairments identified in evaluation, in order to reduce pain, improve quality of life, and maximize activity tolerance for ADL, IADL, and leisure/fitness. Physical therapy will help pt achieve long and short term goals of care.     OBJECTIVE IMPAIRMENTS: decreased activity tolerance, decreased endurance, decreased knowledge of condition, decreased mobility, decreased ROM, decreased strength, increased muscle spasms, improper body mechanics, postural dysfunction, and prosthetic dependency .   ACTIVITY LIMITATIONS: carrying, lifting, bending, sleeping, transfers, bed mobility, continence, and toileting  PARTICIPATION LIMITATIONS: cleaning, laundry, interpersonal relationship, driving, community activity, occupation, and yard work  PERSONAL FACTORS: Age, Behavior pattern, Education, Fitness, Past/current experiences, and Profession are also affecting patient's functional outcome.   REHAB POTENTIAL: Good  CLINICAL DECISION MAKING: Evolving/moderate complexity  EVALUATION COMPLEXITY: Moderate   GOALS: Goals reviewed with patient? No  SHORT TERM GOALS: Target date: 08/19/23  Pt to perform all driving maneuvers pain free.  Baseline: pain with head turns (07/18/2023);  Goal status: In progress  2.  Deep neck flexor endurance of >25 seconds absent any HA.  Baseline: 11-15sec (07/18/2023);  Goal status: In progress  3.  Successful abdominal draw in maneuver >5sec in quadruped, short sitting, supine, and standing. Baseline: (07/18/2023);  Goal status: In progress  LONG TERM GOALS: Target date: 09/18/23  Ability to perform deadbug exercise without loss of lumbo pelvic isometric control.  Baseline: (07/18/2023);   Goal status: In progress  2.  Pt able to perform ABD plank exercise x60sec without loss of spne curvature or change in hip flexion angle, absent pain.  Baseline: (07/18/2023);  Goal status: In progress  3.  NDI score of 0%.  Baseline: 14%  (07/18/2023);  Goal status: In progress  PLAN:  PT FREQUENCY: 1-2x/week  PT DURATION: 8 weeks  PLANNED INTERVENTIONS: 97110-Therapeutic exercises, 97530- Therapeutic activity, 97112- Neuromuscular re-education, 97535- Self Care, 40981- Manual therapy, G0283- Electrical stimulation (unattended), [phone]- Electrical stimulation (manual), Patient/Family education, Balance training, Stair training, Dry Needling, Joint mobilization, and Joint manipulation  PLAN FOR NEXT SESSION: Review HEP, advance as indicated. Progressive interventions for neck and core as appropriate.   Carilyn Charles. Artemio Larry, PT, DPT 08/16/23, 5:32 PM  [facility] [address] [place], [place] [address] P: [phone] I F: [phone]

## 2023-08-18 ENCOUNTER — Other Ambulatory Visit (HOSPITAL_COMMUNITY): Payer: Self-pay

## 2023-08-21 ENCOUNTER — Ambulatory Visit: Payer: 59 | Admitting: Physical Therapy

## 2023-08-23 ENCOUNTER — Encounter: Payer: 59 | Admitting: Physical Therapy

## 2023-08-24 ENCOUNTER — Telehealth: Admitting: Family Medicine

## 2023-08-24 ENCOUNTER — Encounter: Payer: Self-pay | Admitting: Family Medicine

## 2023-08-24 DIAGNOSIS — K120 Recurrent oral aphthae: Secondary | ICD-10-CM

## 2023-08-24 MED ORDER — NYSTATIN 100000 UNIT/ML MT SUSP: 5.0000 mL | Freq: Four times a day (QID) | OROMUCOSAL | 0 refills | Status: AC | PRN

## 2023-08-24 NOTE — Progress Notes (Signed)
 Virtual Visit Consent   Elizabeth Berger, you are scheduled for a virtual visit with a [facility] provider today. Just as with appointments in the office, your consent must be obtained to participate. Your consent will be active for this visit and any virtual visit you may have with one of our providers in the next 365 days. If you have a MyChart account, a copy of this consent can be sent to you electronically.  As this is a virtual visit, video technology does not allow for your provider to perform a traditional examination. This may limit your provider's ability to fully assess your condition. If your provider identifies any concerns that need to be evaluated in person or the need to arrange testing (such as labs, EKG, etc.), we will make arrangements to do so. Although advances in technology are sophisticated, we cannot ensure that it will always work on either your end or our end. If the connection with a video visit is poor, the visit may have to be switched to a telephone visit. With either a video or telephone visit, we are not always able to ensure that we have a secure connection.  By engaging in this virtual visit, you consent to the provision of healthcare and authorize for your insurance to be billed (if applicable) for the services provided during this visit. Depending on your insurance coverage, you may receive a charge related to this service.  I need to obtain your verbal consent now. Are you willing to proceed with your visit today? Elizabeth Berger has provided verbal consent on 08/24/2023 for a virtual visit (video or telephone). Elizabeth Huger, FNP  Date: 08/24/2023 9:04 AM   Virtual Visit via Video Note   I, Elizabeth Berger, connected with  Elizabeth Berger  ([id number], 01/16/1991) on 08/24/23 at  9:00 AM EDT by a video-enabled telemedicine application and verified that I am speaking with the correct person using two identifiers.  Location: Patient: Virtual Visit Location  Patient: Home Provider: Virtual Visit Location Provider: Home Office   I discussed the limitations of evaluation and management by telemedicine and the availability of in person appointments. The patient expressed understanding and agreed to proceed.    History of Present Illness: Elizabeth Berger is a 33 y.o. who identifies as a female who was assigned female at birth, and is being seen today for an ulcer in upper gums not healing over the past week. Her daughter had a stomach virus but she has not been sick or on antibiotics. No trouble swallowing or breathing. Elizabeth Berger  HPI: HPI  Problems:  Patient Active Problem List   Diagnosis Date Noted   Sinus tachycardia 06/18/2023   Late menses 05/19/2023   Overweight (BMI 25.0-29.9) 05/19/2023   Diastasis recti 05/19/2023   Cervical paraspinal muscle spasm 05/19/2023   Stress and adjustment reaction 12/15/2022   Periodic headache syndrome, not intractable 12/15/2022   Transient neurological symptoms 12/15/2022   Paroxysmal tachycardia (HCC) 10/12/2022   Generalized anxiety disorder with panic attacks 10/12/2022   Urgency of urination 07/31/2022   Avitaminosis D 07/31/2022   Annual physical exam 04/18/2019    Allergies: No Known Allergies Medications:  Current Outpatient Medications:    magic mouthwash (nystatin , lidocaine , diphenhydrAMINE , alum & mag hydroxide) suspension, Swish and spit 5 mLs 4 (four) times daily as needed for up to 10 days for mouth pain., Disp: 180 mL, Rfl: 0   clobetasol  (TEMOVATE ) 0.05 % external solution, Apply 1 Application topically to itchy areas of  scalp 2 (two) times daily as needed., Disp: 50 mL, Rfl: 1   hydrOXYzine  (ATARAX ) 10 MG tablet, Take 1 tablet (10 mg total) by mouth every 6 (six) hours as needed for anxiety (sleep)., Disp: 90 tablet, Rfl: 3   ketoconazole  (NIZORAL ) 2 % shampoo, Apply 1 Application topically 2 (two) times a week. Shampoo into scalp let sit 5 minutes then wash off., Disp: 120 mL, Rfl: 5    nitrofurantoin , macrocrystal-monohydrate, (MACROBID ) 100 MG capsule, Take 1 capsule (100 mg total) by mouth 2 (two) times daily. (Patient not taking: Reported on 06/18/2023), Disp: 10 capsule, Rfl: 0   norethindrone  (INCASSIA ) 0.35 MG tablet, Take 1 tablet (0.35 mg total) by mouth daily. (Patient not taking: Reported on 06/18/2023), Disp: 84 tablet, Rfl: 3   prochlorperazine  (COMPAZINE ) 10 MG tablet, Take 1 tablet (10 mg total) by mouth every 6 (six) hours as needed for nausea or vomiting. (Patient not taking: Reported on 06/18/2023), Disp: 30 tablet, Rfl: 0   propranolol  (INDERAL ) 10 MG tablet, Take 1 tablet (10 mg total) by mouth 3 (three) times daily., Disp: 270 tablet, Rfl: 3   venlafaxine  XR (EFFEXOR  XR) 75 MG 24 hr capsule, Take 1 capsule (75 mg total) by mouth at bedtime. (Patient not taking: Reported on 06/18/2023), Disp: 30 capsule, Rfl: 2   Vitamin D , Ergocalciferol , (DRISDOL ) 1.25 MG (50000 UNIT) CAPS capsule, Take 1 capsule (50,000 Units total) by mouth every 7 (seven) days., Disp: 5 capsule, Rfl: 0  Observations/Objective: Patient is well-developed, well-nourished in no acute distress.  Resting comfortably  at home.  Head is normocephalic, atraumatic.  No labored breathing.  Speech is clear and coherent with logical content.  Patient is alert and oriented at baseline.    Assessment and Plan: 1. Aphthous ulcer (Primary)  Follow up with dentist if sx persist or worsen.   Follow Up Instructions: I discussed the assessment and treatment plan with the patient. The patient was provided an opportunity to ask questions and all were answered. The patient agreed with the plan and demonstrated an understanding of the instructions.  A copy of instructions were sent to the patient via MyChart unless otherwise noted below.     The patient was advised to call back or seek an in-person evaluation if the symptoms worsen or if the condition fails to improve as anticipated.    Elizabeth Corsino, FNP

## 2023-08-24 NOTE — Patient Instructions (Signed)
 Oral Ulcers Oral ulcers are small sores inside the mouth or near the mouth. They may occur on or inside the lips, inside the cheeks, on the tongue, or anywhere else inside or near the mouth. They may be called canker sores or cold sores, which are two types of oral ulcers. Many oral ulcers are harmless and go away on their own. In some cases, oral ulcers may require medical care to determine the cause and proper treatment. What are the causes? Common causes of this condition include: Infections caused by viruses, bacteria, or fungi. Emotional stress. Foods or chemicals that irritate the mouth. Injury or physical irritation of the mouth. Medicines. Allergies. Tobacco use. Less common causes include: Skin disease. A type of herpes virus infection (herpes simplex or herpes zoster). Oral cancer. In some cases, the cause may not be known. What increases the risk? You are more likely to develop this condition if: You wear dental braces, dentures, or retainers. You do not take good care of your mouth and teeth (poor oral hygiene). You have sensitive skin. You have a condition that affects the entire body (systemic condition), such as an immune disorder. What are the signs or symptoms? The main symptom of this condition is having one or more oval-shaped or round ulcers that have red borders. Symptoms may vary depending on the cause. This includes: Location of the ulcers. Ulcers may be found inside the mouth, on the gums, or on the insides of the lips or cheeks. They may also be found on the lips or on skin that is near the mouth, such as the cheeks or chin. Pain. Ulcers can be painful and uncomfortable, or they can be painless. Appearance of the ulcers. They may look like red blisters and be filled with fluid, or they may be white or yellow patches. Frequency of outbreaks. Ulcers may go away permanently after one outbreak, or they may come back (recur) often or rarely. How is this  diagnosed? This condition is diagnosed with a physical exam. Your health care provider may ask you questions about your lifestyle and your medical history. You may have tests, including: Blood tests. Removal of a small number of cells from an ulcer to be examined under a microscope (biopsy). How is this treated? Treatment depends on the severity and cause of the condition. Oral ulcers often go away on their own in 1-2 weeks. Treatment may include medicines, such as: Medicines to treat a viral infection (antivirals), a bacterial infection (antibiotics), or a fungal infection (antifungals). Medicines to help control pain. This may include: Over-the-counter pain medicines. Gel, cream, or spray to numb the area (topical anesthetic) if you have severe pain. Other medicines to coat or numb your mouth. Follow these instructions at home: Medicines Take or use over-the-counter and prescription medicines only as told by your health care provider. If you were prescribed an antibiotic medicine, take it as told by your health care provider. Do not stop taking the antibiotic even if you start to feel better. Do not use products that contain benzocaine (including numbing gels) to treat teething or mouth pain in children who are younger than 2 years. These products may cause a rare but serious blood condition. Eating and drinking Eat a balanced diet. Do not eat: Spicy foods. Citrus, such as oranges. Other foods and drinks that you think may cause or irritate your ulcers. Drink enough fluid to keep your urine pale yellow. Avoid drinking alcohol. Lifestyle  Practice good oral hygiene: Gently brush your teeth  with a soft toothbrush two times a day. Floss your teeth every day. Get regular dental cleanings and checkups. Do not use any products that contain nicotine or tobacco. These products include cigarettes, chewing tobacco, and vaping devices, such as e-cigarettes. If you need help quitting, ask your  health care provider. Managing pain If directed, put ice on your face in the affected area to help reduce pain. To do this: Put ice in a plastic bag. Place a towel between your skin and the bag. Leave the ice on for 20 minutes, 2-3 times a day. Remove the ice if your skin turns bright red. This is very important. If you cannot feel pain, heat, or cold, you have a greater risk of damage to the area. Avoid physical or chemical irritants that may have caused the ulcers or made them worse, such as mouthwashes that contain alcohol (ethanol). If you wear dental braces, dentures, or retainers, work with your health care provider to make sure these devices are fitted correctly. If you were prescribed a prescription mouthwash to help reduce pain in your mouth, use it as told by your health care provider. General instructions Rinse your mouth with a mixture of salt and water 3-4 times a day or as told by your health care provider. To make salt water, completely dissolve -1 tsp (3-6 g) of salt in 1 cup (237 mL) of warm water. Keep all follow-up visits. This is important. Contact a health care provider if: You have: Pain that gets worse or does not get better with medicine. Four or more ulcers at one time. A fever. New ulcers that look or feel different from other ulcers you have. Inflammation in one eye or both eyes. Ulcers that do not go away after 10 days. You develop new symptoms in your mouth, such as: Bleeding or crusting around your lips or gums. Tooth pain. Difficulty swallowing. You develop symptoms on your skin or genitals, such as: A rash or blisters. Burning or itching sensations. Your ulcers begin or get worse after you start a new medicine. Get help right away if: You have difficulty breathing. You have swelling in your face or neck. You have excessive bleeding from your mouth. You have severe pain. Summary Oral ulcers may occur anywhere inside or near the mouth. They can be  caused by many things, such as infections, stress, injury or irritation, or tobacco use. Oral ulcers can be painful or painless. Treatment may include medicines to relieve pain or to treat an infection (if appropriate). Most oral ulcers go away in 1-2 weeks. This information is not intended to replace advice given to you by your health care provider. Make sure you discuss any questions you have with your health care provider. Document Revised: 01/26/2021 Document Reviewed: 01/26/2021 Elsevier Patient Education  2024 [employer].

## 2023-08-27 NOTE — Telephone Encounter (Signed)
 Hi Diane, Looks like you saw her with [facility] for virtual visit.  Her Rx seems to have not gone through. Are you able to resend it/respond to patient? Thanks! Dr Geraldene Kleine

## 2023-08-28 ENCOUNTER — Ambulatory Visit: Payer: 59 | Admitting: Physical Therapy

## 2023-08-28 DIAGNOSIS — M542 Cervicalgia: Secondary | ICD-10-CM

## 2023-08-28 DIAGNOSIS — M6281 Muscle weakness (generalized): Secondary | ICD-10-CM

## 2023-08-28 NOTE — Therapy (Signed)
 OUTPATIENT PHYSICAL THERAPY TREATMENT   Patient Name: Elizabeth Berger MRN: [id number] DOB:Dec 21, 1990, 33 y.o., female Today's Date: 08/28/2023  END OF SESSION:  PT End of Session - 08/28/23 1649     Visit Number 8    Number of Visits 12    Date for PT Re-Evaluation 09/12/23    Authorization Type [place] [employer] reporting period from 07/18/2023    Progress Note Due on Visit 10    PT Start Time 1648    PT Stop Time 1728    PT Time Calculation (min) 40 min    Activity Tolerance Patient tolerated treatment well    Behavior During Therapy [facility] for tasks assessed/performed                    Past Medical History:  Diagnosis Date   Anxiety    Depression    FH: migraines    mom   Herpes    1&2 IgG+   Migraines    Past Surgical History:  Procedure Laterality Date   BREAST BIOPSY Left 12/18/2022   us  bx mass, heart marker, path pending   BREAST BIOPSY Left 12/18/2022   US  LT BREAST BX W LOC DEV 1ST LESION IMG BX SPEC US  GUIDE 12/18/2022 [facility]   TONSILLECTOMY     Patient Active Problem List   Diagnosis Date Noted   Sinus tachycardia 06/18/2023   Late menses 05/19/2023   Overweight (BMI 25.0-29.9) 05/19/2023   Diastasis recti 05/19/2023   Cervical paraspinal muscle spasm 05/19/2023   Stress and adjustment reaction 12/15/2022   Periodic headache syndrome, not intractable 12/15/2022   Transient neurological symptoms 12/15/2022   Paroxysmal tachycardia (HCC) 10/12/2022   Generalized anxiety disorder with panic attacks 10/12/2022   Urgency of urination 07/31/2022   Avitaminosis D 07/31/2022   Annual physical exam 04/18/2019    PCP: Rufus Council, FNP REFERRING PROVIDER: Tasia Farr, FNP REFERRING DIAG: Neck pain; postpartum diastasis rectus abdominus   THERAPY DIAG:  Muscle weakness (generalized)  Cervicalgia Rationale for Evaluation and Treatment: Rehabilitation ONSET DATE: Diastasis Rectus (~2 years ago); Neck pain (1 years)    SUBJECTIVE:                                                                                                                                                                                                         PERTINENT HISTORY:  Elizabeth Berger is a 32yoF who is referred to OPPT for both chronic diastasis rectus abdominus problem (now 2 years postpartum) and a newer neck pain problem. Patient reports  a restriction in mobility, particularly when turning her neck to left. The pain, described as sharp and radiating from the neck to the shoulder, intensifies to a level of 10 upon reaching a certain point of movement.PMH: urinary retention, constipation, peripartum perineal trauma, migraine HA, dizziness. Pt also followed by cardiology for sinus tachycardia, palpitations, near syncope since pregnancy- pt also reports Covid-19 infection x4. Pt on BB therapy TID or as needed. Pt has also been working toward achieving health weight loss goal through diet and exercise. 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   Exercise history:  prior to pregnancy she was more active out of the home and is more sedentary now, but she did not exercise regularly. Elizabeth Berger   She wants to be able to exercise more. She tries to look up exercises on youtube. She thinks she is able to exercise about 2 x a week for at least 30 minutes. She is limited when her heart races and it.She uses a heart rate monitor and it gets up to 160 and 170 bpm and she starts to feel dizzy (this happens a lot less than it did when she was freshly postpartum). There is not specific thing that makes her do it. She can stop before she passes out. She can work out when her child's dad is home and she can have a few minutes.   Pelvic floor specific questions:  During her labor she pushed for over 6 hours. She had perineal tearing with stitching. She has not had pelvic floor PT. She could not pee for the first few days (needed a catheter) and for several months. She  had to strain for several months to pee. She sometimes finds that urinates more volume than she was aware was in there but she no longer has to bear down. She does leak with sneezing but is always aware of it. She continues to have difficulty with constipation without an obvious cause. After birth she had really bad hemmhorids and something else. She had to have suppositories to bring the swelling down. Cannot get everything out. The stool is hard, dry, and pebbly. This has been a problem just after pregnancy. She does have trouble with pain with sex (about 80% of the time). Hurts with and without a lubricant. She does feel some heaviness when holding her daughter for a long time or going up stairs while holding her   SUBJECTIVE STATEMENT: Patient states she cut out gluten this week and is feeling better with less bloating. Her neck pain is unchanged. No updates in anything else. She took a tumble down the stairs but she is okay.   PAIN:  Are you having pain? 8/10 at left side of neck when in left sidebending, 0/10 when not moving neck.   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   OBJECTIVE:   TREATMENT   Manual therapy: to reduce pain and tissue tension, improve range of motion, neuromodulation, in order to promote improved ability to complete functional activities. PRONE STM to B (L > R) upper cervical spine focusing on suboccipitals including Oblique Capitus Inferior and cervical paraspinals  CPA along cervical spine segments, focusing on mid to upper cervical spine,  UPA grade II-IV at C2, C3, and C4 2x30 seconds at each level on left 1x20-30 seconds at each level on right ( Caused headache on  both sides  SEATED C1-C2 rotational mobilization attempting to block C2 with left thumb while passively rotating head  to right, grade II-IV, 2x 20-30 seconds.   Education on rationale behind manual and dry needling techniques applied and how they likely helped patient's pain.   Therapeutic exercise: therapeutic exercises that incorporate ONE parameter at one or more areas of the body to centralize symptoms, develop strength and endurance, range of motion, and flexibility required for successful completion of functional activities.  Asterisk sign: end range left side bending + right rotation Repeated throughout session between other interventions to help assess effect of interventions.   Seated upper cervical rotation SNAG with pillow case to the RIGHT Started with rolled up pillow case but had better response from using edge of pillow case to be more specific to the the upper cervical spine.  1-2x10 total Added to HEP  Pt required multimodal cuing for proper technique and to facilitate improved neuromuscular control, strength, range of motion, and functional ability resulting in improved performance and form.  Trigger Point Dry Needling  Subsequent Treatment: Instructions provided previously at initial dry needling treatment.  Instructions reviewed, if requested by the patient, prior to subsequent dry needling treatment.   Patient Verbal Consent Given: Yes Education Handout Provided: Previously Provided Muscles Treated: 2 dry needle(s) .25mm x 40mm inserted with 2 sticks left side and 1 stick right side into Oblique Capitus Inferior and 1 stick each side into C2 multifidi with patient in prone position to decrease pain and spasms along patient's neck region.  Electrical Stimulation Performed: No Treatment Response/Outcome: Patient's concordant pain resolved with dry needling followed by manual techniques. No abnormal or unexpected response noted.     PATIENT EDUCATION:  Education details: Exercise purpose/form. Self management techniques.  Education on HEP including handout. Person educated:  Patient Education method: Explanation, Demonstration, Tactile cues, Verbal cues, and Handouts Education comprehension: verbalized understanding, returned demonstration, and needs further education  HOME EXERCISE PROGRAM: Access Code: [facility] URL: [email or web address] Date: 07/31/2023 Prepared by: Alleen Isle  Exercises - Seated Transversus Abdominis Bracing  - 2 x daily - 3 sets - 10 reps - 3 hold - Seated Pelvic Floor Contraction  - 3 x daily - 1 sets - 10-20 reps - Seated Quick Flick Pelvic Floor Contractions  - 3 x daily - 1 sets - 20 reps - 0-1 seconds hold - Seated Pelvic Floor Contraction  - 3 x daily - 1 sets - 10 reps - 2 seconds hold - Deep Squat with Pelvic Floor Relaxation  - 3 x daily - 3-4 sets - 3-4 reps - 20-30 seconds hold - The Hundred 3 Intermediate - Table Top  - 1 x daily - 5 reps - 10-20 seconds hold - Plank on Knees  - 1 x daily - 5 reps - 30 seconds hold  HOME EXERCISE PROGRAM [[id number]] View at my-exercise-code.com code [id number] cervical self snag  -  Repeat 10 Repetitions, Complete 2 Sets, Perform 3 Times a Day  HOME EXERCISE PROGRAM [[id number]] View at [email or web address]-exercise-[email or web address] using code [id number] Neck Rotation and Nod- capital, atlanto occipital mobilization  -  Repeat 20 Repetitions, Hold 1 Second(s), Complete 1 Set, Perform 3 Times a Day  ASSESSMENT:  CLINICAL IMPRESSION: Patient with excellent response to dry needling and prone manual techniques targeting Oblicque Capitus Inferior and C2 multifidi as well as C1-C2, C2-C3, and C3-C4 apophyseal joints bilaterally (but focus on improving motion at the left side). Patient learned upper cervical spine SNAG with edge of pillow case  and was able to tolerate well and add to HEP to reinforce progress made in clinic. Patient would benefit from continued management of limiting condition by skilled physical therapist to address remaining impairments and functional limitations to work towards stated goals and  return to PLOF or maximal functional independence.    From initial PT evaluation on 07/18/2023:  32yoF referred for chronic neck pain after insidious onset 1 year prior, correlates with left sided facet closure, particularly side bend and rotation, extension tolerated pain free, notable taut bands and trigger points in neck. Pt has signs of WNL hypermobility, author explained potential for cervical joint hyperextension during sleep phases which can result in joint irritation and protective spasm. Pt also shows limitations in deep cervical flexors which correlates well with her reports of neck fatigue toward end of day. Pt also complaining of abdominal motor weakness now 2 years post partum. Despite referral, do not see signs of true diastasis of abdominal wall, however pt does exhibit imbalance between intrinsic and extrinsic abdominal muscle layers. Educated patient on ways to begin engaging this deeper layer of muscles and explained some possible connection to other medical problems she has been treated for. Patient will benefit from skilled physical therapy intervention to reduce deficits and impairments identified in evaluation, in order to reduce pain, improve quality of life, and maximize activity tolerance for ADL, IADL, and leisure/fitness. Physical therapy will help pt achieve long and short term goals of care.     OBJECTIVE IMPAIRMENTS: decreased activity tolerance, decreased endurance, decreased knowledge of condition, decreased mobility, decreased ROM, decreased strength, increased muscle spasms, improper body mechanics, postural dysfunction, and prosthetic dependency .   ACTIVITY LIMITATIONS: carrying, lifting, bending, sleeping, transfers, bed mobility, continence, and toileting  PARTICIPATION LIMITATIONS: cleaning, laundry, interpersonal relationship, driving, community activity, occupation, and yard work  PERSONAL FACTORS: Age, Behavior pattern, Education, Fitness, Past/current  experiences, and Profession are also affecting patient's functional outcome.   REHAB POTENTIAL: Good  CLINICAL DECISION MAKING: Evolving/moderate complexity  EVALUATION COMPLEXITY: Moderate   GOALS: Goals reviewed with patient? No  SHORT TERM GOALS: Target date: 08/19/23  Pt to perform all driving maneuvers pain free.  Baseline: pain with head turns (07/18/2023);  Goal status: In progress  2.  Deep neck flexor endurance of >25 seconds absent any HA.  Baseline: 11-15sec (07/18/2023);  Goal status: In progress  3.  Successful abdominal draw in maneuver >5sec in quadruped, short sitting, supine, and standing. Baseline: (07/18/2023);  Goal status: In progress  LONG TERM GOALS: Target date: 09/18/23  Ability to perform deadbug exercise without loss of lumbo pelvic isometric control.  Baseline: (07/18/2023);  Goal status: In progress  2.  Pt able to perform ABD plank exercise x60sec without loss of spne curvature or change in hip flexion angle, absent pain.  Baseline: (07/18/2023);  Goal status: In progress  3.  NDI score of 0%.  Baseline: 14%  (07/18/2023);  Goal status: In progress  PLAN:  PT FREQUENCY: 1-2x/week  PT DURATION: 8 weeks  PLANNED INTERVENTIONS: 97110-Therapeutic exercises, 97530- Therapeutic activity, 97112- Neuromuscular re-education, 97535- Self Care, 16109- Manual therapy, G0283- Electrical stimulation (unattended), [phone]- Electrical stimulation (manual), Patient/Family education, Balance training, Stair training, Dry Needling, Joint mobilization, and Joint manipulation  PLAN FOR NEXT SESSION: Review HEP, advance as indicated. Progressive interventions for neck and core as appropriate.   Carilyn Charles. Artemio Larry, PT, DPT 08/28/23, 7:13 PM  [facility] [address] [place], [place] [address] P: [phone] I F: [phone]

## 2023-09-04 ENCOUNTER — Ambulatory Visit: Payer: 59 | Attending: Family Medicine

## 2023-09-04 DIAGNOSIS — M542 Cervicalgia: Secondary | ICD-10-CM | POA: Diagnosis not present

## 2023-09-04 DIAGNOSIS — M6281 Muscle weakness (generalized): Secondary | ICD-10-CM | POA: Insufficient documentation

## 2023-09-04 NOTE — Therapy (Signed)
 OUTPATIENT PHYSICAL THERAPY TREATMENT   Patient Name: Elizabeth Berger MRN: [id number] DOB:1990/11/17, 33 y.o., female Today's Date: 09/04/2023  END OF SESSION:  PT End of Session - 09/04/23 1647     Visit Number 9    Number of Visits 12    Date for PT Re-Evaluation 09/12/23    Authorization Type [place] [employer] reporting period from 07/18/2023    Authorization Time Period 07/18/23-09/12/23    Progress Note Due on Visit 10    PT Start Time 1645    PT Stop Time 1725    PT Time Calculation (min) 40 min    Activity Tolerance Patient tolerated treatment well;No increased pain    Behavior During Therapy WFL for tasks assessed/performed                    Past Medical History:  Diagnosis Date   Anxiety    Depression    FH: migraines    mom   Herpes    1&2 IgG+   Migraines    Past Surgical History:  Procedure Laterality Date   BREAST BIOPSY Left 12/18/2022   us  bx mass, heart marker, path pending   BREAST BIOPSY Left 12/18/2022   US  LT BREAST BX W LOC DEV 1ST LESION IMG BX SPEC US  GUIDE 12/18/2022 [facility]   TONSILLECTOMY     Patient Active Problem List   Diagnosis Date Noted   Sinus tachycardia 06/18/2023   Late menses 05/19/2023   Overweight (BMI 25.0-29.9) 05/19/2023   Diastasis recti 05/19/2023   Cervical paraspinal muscle spasm 05/19/2023   Stress and adjustment reaction 12/15/2022   Periodic headache syndrome, not intractable 12/15/2022   Transient neurological symptoms 12/15/2022   Paroxysmal tachycardia (HCC) 10/12/2022   Generalized anxiety disorder with panic attacks 10/12/2022   Urgency of urination 07/31/2022   Avitaminosis D 07/31/2022   Annual physical exam 04/18/2019    PCP: Rufus Council, FNP REFERRING PROVIDER: Tasia Farr, FNP REFERRING DIAG: Neck pain; postpartum diastasis rectus abdominus   THERAPY DIAG:  Muscle weakness (generalized)  Cervicalgia Rationale for Evaluation and Treatment: Rehabilitation ONSET  DATE: Diastasis Rectus (~2 years ago); Neck pain (1 years)   SUBJECTIVE:                                                                                                                                                                                                         PERTINENT HISTORY:  Elizabeth Berger is a 32yoF who is referred to OPPT for both chronic diastasis rectus abdominus problem (now 2 years  postpartum) and a newer neck pain problem. Patient reports a restriction in mobility, particularly when turning her neck to left. The pain, described as sharp and radiating from the neck to the shoulder, intensifies to a level of 10 upon reaching a certain point of movement.PMH: urinary retention, constipation, peripartum perineal trauma, migraine HA, dizziness. Pt also followed by cardiology for sinus tachycardia, palpitations, near syncope since pregnancy- pt also reports Covid-19 infection x4. Pt on BB therapy TID or as needed. Pt has also been working toward achieving health weight loss goal through diet and exercise. 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   Exercise history:  prior to pregnancy she was more active out of the home and is more sedentary now, but she did not exercise regularly. [name]   She wants to be able to exercise more. She tries to look up exercises on youtube. She thinks she is able to exercise about 2 x a week for at least 30 minutes. She is limited when her heart races and it.She uses a heart rate monitor and it gets up to 160 and 170 bpm and she starts to feel dizzy (this happens a lot less than it did when she was freshly postpartum). There is not specific thing that makes her do it. She can stop before she passes out. She can work out when her child's dad is home and she can have a few minutes.   Pelvic floor specific questions:  During her labor she pushed for over 6 hours. She had perineal tearing with stitching. She has not had pelvic floor PT. She could not pee for the  first few days (needed a catheter) and for several months. She had to strain for several months to pee. She sometimes finds that urinates more volume than she was aware was in there but she no longer has to bear down. She does leak with sneezing but is always aware of it. She continues to have difficulty with constipation without an obvious cause. After birth she had really bad hemmhorids and something else. She had to have suppositories to bring the swelling down. Cannot get everything out. The stool is hard, dry, and pebbly. This has been a problem just after pregnancy. She does have trouble with pain with sex (about 80% of the time). Hurts with and without a lubricant. She does feel some heaviness when holding her daughter for a long time or going up stairs while holding her   SUBJECTIVE STATEMENT: Pt still gluten free, doing well regarding her neck. HEP is going well also.   PAIN:  Are you having pain? 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   OBJECTIVE:   TREATMENT 09/04/23 -OMEGA Lat pull down 1x15 @ 35lb  -OMEGA chest press 1x15 @ 15lb  -Omega horizontal row (seated) 1x15@ 25lb  -OMEGA Lat pulldown 1x15 @ 35lb  -OMEGA chest press 1x15 @ 15lb  -Omega horizontal row (seated) 1x15@ 25lb  -OMEGA Lat pulldown 1x15 @ 35lb  -OMEGA chest press 1x15 @ 15lb  -Omega horizontal row (seated) 1x15@ 25lb   -standing pallof press 1x10 bilat 10lb (OMEGA cable)  -air squats x10 (butt taps to low OMEGA seat)  -OMEGA rotary hip machina: 40lb hip flexion 1x10 bilat  -standing pallof press 1x10 bilat 10lb (OMEGA cable)  -air squats x10 c 15lb med  ball (butt taps to low OMEGA seat)  -OMEGA rotary hip machina: 40lb hip flexion 1x10 bilat  -standing pallof press 1x10 bilat 10lb (OMEGA cable)  -air squats x10 c  15lb med ball (butt taps to low OMEGA seat)  -OMEGA rotary hip machina: 40lb hip flexion 1x10 bilat   -ART to cervical extensors x5 minutes    PATIENT EDUCATION:  Education details: Exercise purpose/form. Self management techniques.  Education on HEP including handout. Person educated: Patient Education method: Explanation, Demonstration, Tactile cues, Verbal cues, and Handouts Education comprehension: verbalized understanding, returned demonstration, and needs further education  HOME EXERCISE PROGRAM: Access Code: [facility] URL: [email or web address] Date: 07/31/2023 Prepared by: Alleen Isle  Exercises - Seated Transversus Abdominis Bracing  - 2 x daily - 3 sets - 10 reps - 3 hold - Seated Pelvic Floor Contraction  - 3 x daily - 1 sets - 10-20 reps - Seated Quick Flick Pelvic Floor Contractions  - 3 x daily - 1 sets - 20 reps - 0-1 seconds hold - Seated Pelvic Floor Contraction  - 3 x daily - 1 sets - 10 reps - 2 seconds hold - Deep Squat with Pelvic Floor Relaxation  - 3 x daily - 3-4 sets - 3-4 reps - 20-30 seconds hold - The Hundred 3 Intermediate - Table Top  - 1 x daily - 5 reps - 10-20 seconds hold - Plank on Knees  - 1 x daily - 5 reps - 30 seconds hold  HOME EXERCISE PROGRAM [[id number]] View at my-exercise-[email or web address] code [id number] cervical self snag  -  Repeat 10 Repetitions, Complete 2 Sets, Perform 3 Times a Day  HOME EXERCISE PROGRAM [[id number]] View at [email or web address]-exercise-[email or web address] using code [id number] Neck Rotation and Nod- capital, atlanto occipital mobilization  -  Repeat 20 Repetitions, Hold 1 Second(s), Complete 1 Set, Perform 3 Times a Day  ASSESSMENT: CLINICAL IMPRESSION: Progression in core strength program today, good tolerance overall, able to achieve additional loading without any provocation of symptoms. Neck continues to feel improved since new techniques last session. Continued with soft tissue lengthening in posterior cervical extensors, 4-5 twitch  responses in session with sustained release. Pt will continue to benefit from PT interventions to achieve goals of care.   OBJECTIVE IMPAIRMENTS: decreased activity tolerance, decreased endurance, decreased knowledge of condition, decreased mobility, decreased ROM, decreased strength, increased muscle spasms, improper body mechanics, postural dysfunction, and prosthetic dependency .   ACTIVITY LIMITATIONS: carrying, lifting, bending, sleeping, transfers, bed mobility, continence, and toileting  PARTICIPATION LIMITATIONS: cleaning, laundry, interpersonal relationship, driving, community activity, occupation, and yard work  PERSONAL FACTORS: Age, Behavior pattern, Education, Fitness, Past/current experiences, and Profession are also affecting patient's functional outcome.   REHAB POTENTIAL: Good  CLINICAL DECISION MAKING: Evolving/moderate complexity  EVALUATION COMPLEXITY: Moderate   GOALS: Goals reviewed with patient? No  SHORT TERM GOALS: Target date: 08/19/23  Pt to perform all driving maneuvers pain free.  Baseline: pain with head turns (07/18/2023);  Goal status: In progress  2.  Deep neck flexor endurance of >25 seconds absent any HA.  Baseline: 11-15sec (07/18/2023);  Goal status: In progress  3.  Successful abdominal draw in maneuver >5sec in quadruped, short sitting, supine, and standing. Baseline: (07/18/2023);  Goal status: In progress  LONG TERM GOALS: Target date: 09/18/23  Ability to perform deadbug exercise without loss of lumbo pelvic isometric control.  Baseline: (07/18/2023);  Goal status: In progress  2.  Pt able to perform ABD plank exercise x60sec without loss of spne curvature or change in  hip flexion angle, absent pain.  Baseline: (07/18/2023);  Goal status: In progress  3.  NDI score of 0%.  Baseline: 14%  (07/18/2023);  Goal status: In progress  PLAN:  PT FREQUENCY: 1-2x/week  PT DURATION: 8 weeks  PLANNED INTERVENTIONS: 97110-Therapeutic  exercises, 97530- Therapeutic activity, 97112- Neuromuscular re-education, 97535- Self Care, 16109- Manual therapy, G0283- Electrical stimulation (unattended), [phone]- Electrical stimulation (manual), Patient/Family education, Balance training, Stair training, Dry Needling, Joint mobilization, and Joint manipulation  PLAN FOR NEXT SESSION:  Neck reassessment, begin looking at LT goals assessment.    4:53 PM, 09/04/23 Dawn Eth, PT, DPT Physical Therapist - [place] [phone] (Office)

## 2023-09-06 ENCOUNTER — Ambulatory Visit: Payer: 59 | Admitting: Physical Therapy

## 2023-09-09 ENCOUNTER — Encounter: Payer: Self-pay | Admitting: Emergency Medicine

## 2023-09-09 ENCOUNTER — Ambulatory Visit
Admission: EM | Admit: 2023-09-09 | Discharge: 2023-09-09 | Disposition: A | Discharge status: Home / Self Care | Attending: Emergency Medicine | Admitting: Emergency Medicine

## 2023-09-09 ENCOUNTER — Telehealth

## 2023-09-09 DIAGNOSIS — R110 Nausea: Secondary | ICD-10-CM | POA: Insufficient documentation

## 2023-09-09 DIAGNOSIS — N3000 Acute cystitis without hematuria: Secondary | ICD-10-CM | POA: Diagnosis not present

## 2023-09-09 LAB — URINALYSIS, W/ REFLEX TO CULTURE (INFECTION SUSPECTED)
Bilirubin Urine: NEGATIVE
Glucose, UA: NEGATIVE mg/dL
Hgb urine dipstick: NEGATIVE
Ketones, ur: NEGATIVE mg/dL
Nitrite: NEGATIVE
Protein, ur: NEGATIVE mg/dL
Specific Gravity, Urine: 1.01 (ref 1.005–1.030)
pH: 6.5 (ref 5.0–8.0)

## 2023-09-09 LAB — PREGNANCY, URINE: Preg Test, Ur: NEGATIVE

## 2023-09-09 MED ORDER — METRONIDAZOLE 500 MG PO TABS: 500.0000 mg | ORAL_TABLET | Freq: Two times a day (BID) | ORAL | 0 refills | Status: AC

## 2023-09-09 MED ORDER — ONDANSETRON 4 MG PO TBDP: 4.0000 mg | ORAL_TABLET | Freq: Three times a day (TID) | ORAL | 0 refills | Status: DC | PRN

## 2023-09-09 MED ORDER — IBUPROFEN 600 MG PO TABS: 600.0000 mg | ORAL_TABLET | Freq: Three times a day (TID) | ORAL | 0 refills | Status: DC | PRN

## 2023-09-09 MED ORDER — NITROFURANTOIN MONOHYD MACRO 100 MG PO CAPS: 100.0000 mg | ORAL_CAPSULE | Freq: Two times a day (BID) | ORAL | 0 refills | Status: AC

## 2023-09-09 MED ORDER — ONDANSETRON 4 MG PO TBDP
4.0000 mg | ORAL_TABLET | Freq: Once | ORAL | Status: AC
  Administered 2023-09-09: 4 mg via ORAL

## 2023-09-09 MED ORDER — PHENAZOPYRIDINE HCL 200 MG PO TABS: 200.0000 mg | ORAL_TABLET | Freq: Three times a day (TID) | ORAL | 0 refills | Status: DC | PRN

## 2023-09-09 NOTE — Discharge Instructions (Addendum)
 I have sent your urine off for culture to make sure that we have you on the correct antibiotic.  Make sure you continue drinking plenty of fluids.  Pyridium will turn your urine orange, but will help your symptoms.  Finish the Macrobid , even if you feel better.  Take 600 mg of ibuprofen , 1000 mg of Tylenol  together 3-4 times a day as needed for pain.  Discontinue the Flagyl  if your swab comes back negative for BV.  I have prescribed more Zofran  for nausea.  You may take 1 to 2 tablets up to 3 times a day

## 2023-09-09 NOTE — ED Provider Notes (Signed)
 HPI  SUBJECTIVE:  Elizabeth Berger is a 33 y.o. female who presents with dull, achy, constant, nonmigratory, nonradiating low back pain starting yesterday after being on her feet all day and urinary urgency/nausea for the past 4 days.  She reports urinary frequency and states that not much urine is coming out at a time.  She reports mucousy yellow vaginal discharge.  No dysuria, cloudy odorous urine, hematuria, vaginal odor, abnormal bleeding, itching, rash, fevers, vomiting, abdominal, pelvic pain.  She is in a long-term monogamous relationship with a female, who is asymptomatic.  STDs are not a concern today.  No antibiotics in the past month.  No antipyretic in the past 6 hours.  She tried increasing fluids and Tylenol  with improvement in her symptoms.  No aggravating factors.  She has a past medical history of UTI, nonobstructing nephrolithiasis, HSV, BV, yeast and anxiety.  No history of pyelonephritis, other STDs, diabetes.  LMP: Last week.  She just finished menses.  PCP: [facility]  Past Medical History:  Diagnosis Date   Anxiety    Depression    FH: migraines    mom   Herpes    1&2 IgG+   Migraines     Past Surgical History:  Procedure Laterality Date   BREAST BIOPSY Left 12/18/2022   us  bx mass, heart marker, path pending   BREAST BIOPSY Left 12/18/2022   US  LT BREAST BX W LOC DEV 1ST LESION IMG BX SPEC US  GUIDE 12/18/2022 [facility]   TONSILLECTOMY      Family History  Problem Relation Age of Onset   Arthritis Mother    Depression Mother    Cervical cancer Mother    Depression Maternal Grandmother    Cancer Maternal Grandmother 65       Breast   Cancer Maternal Grandfather        ? type ?bone? liver vs kidney   Hearing loss Maternal Grandfather     Social History   Tobacco Use   Smoking status: Former   Smokeless tobacco: Never   Tobacco comments:    social smoker   Vaping Use   Vaping status: Never Used  Substance Use Topics   Alcohol  use: Not Currently   Drug use: Not Currently    No current facility-administered medications for this encounter.  Current Outpatient Medications:    ibuprofen  (ADVIL ) 600 MG tablet, Take 1 tablet (600 mg total) by mouth every 8 (eight) hours as needed., Disp: 30 tablet, Rfl: 0   nitrofurantoin , macrocrystal-monohydrate, (MACROBID ) 100 MG capsule, Take 1 capsule (100 mg total) by mouth 2 (two) times daily for 5 days., Disp: 10 capsule, Rfl: 0   ondansetron  (ZOFRAN -ODT) 4 MG disintegrating tablet, Take 1 tablet (4 mg total) by mouth every 8 (eight) hours as needed for nausea or vomiting., Disp: 20 tablet, Rfl: 0   phenazopyridine (PYRIDIUM) 200 MG tablet, Take 1 tablet (200 mg total) by mouth 3 (three) times daily as needed for pain., Disp: 6 tablet, Rfl: 0   hydrOXYzine  (ATARAX ) 10 MG tablet, Take 1 tablet (10 mg total) by mouth every 6 (six) hours as needed for anxiety (sleep)., Disp: 90 tablet, Rfl: 3   norethindrone  (INCASSIA ) 0.35 MG tablet, Take 1 tablet (0.35 mg total) by mouth daily. (Patient not taking: Reported on 05/29/2023), Disp: 84 tablet, Rfl: 3   propranolol  (INDERAL ) 10 MG tablet, Take 1 tablet (10 mg total) by mouth 3 (three) times daily., Disp: 270 tablet, Rfl: 3   venlafaxine  XR (EFFEXOR   XR) 75 MG 24 hr capsule, Take 1 capsule (75 mg total) by mouth at bedtime. (Patient not taking: Reported on 05/29/2023), Disp: 30 capsule, Rfl: 2  No Known Allergies   ROS  As noted in HPI.   Physical Exam  BP 115/81 (BP Location: Right Arm)   Pulse 97   Temp 98 F (36.7 C) (Oral)   Resp 14   Ht 5\' 2"  (1.575 m)   Wt 68 kg   LMP 09/04/2023 (Approximate)   SpO2 100%   BMI 27.44 kg/m   Constitutional: Well developed, well nourished, no acute distress Eyes:  EOMI, conjunctiva normal bilaterally HENT: Normocephalic, atraumatic,mucus membranes moist Respiratory: Normal inspiratory effort Cardiovascular: Normal rate GI: nondistended.  Soft.  Positive suprapubic tenderness.  No flank  tenderness.  No guarding, rebound. Back: No CVAT.  No L-spine, paralumbar tenderness. skin: No rash, skin intact Musculoskeletal: no deformities Neurologic: Alert & oriented x 3, no focal neuro deficits Psychiatric: Speech and behavior appropriate   ED Course   Medications  ondansetron  (ZOFRAN -ODT) disintegrating tablet 4 mg (4 mg Oral Given 09/09/23 1003)    Orders Placed This Encounter  Procedures   Urine Culture    Standing Status:   Standing    Number of Occurrences:   1    Indication:   Dysuria   Urinalysis, w/ Reflex to Culture (Infection Suspected) -Urine, Clean Catch    Standing Status:   Standing    Number of Occurrences:   1    Specimen Source:   Urine, Clean Catch [76]   Pregnancy, urine    Standing Status:   Standing    Number of Occurrences:   1    Results for orders placed or performed during the hospital encounter of 09/09/23 (from the past 24 hours)  Urinalysis, w/ Reflex to Culture (Infection Suspected) -Urine, Clean Catch     Status: Abnormal   Collection Time: 09/09/23 10:29 AM  Result Value Ref Range   Specimen Source URINE, CLEAN CATCH    Color, Urine STRAW (A) YELLOW   APPearance HAZY (A) CLEAR   Specific Gravity, Urine 1.010 1.005 - 1.030   pH 6.5 5.0 - 8.0   Glucose, UA NEGATIVE NEGATIVE mg/dL   Hgb urine dipstick NEGATIVE NEGATIVE   Bilirubin Urine NEGATIVE NEGATIVE   Ketones, ur NEGATIVE NEGATIVE mg/dL   Protein, ur NEGATIVE NEGATIVE mg/dL   Nitrite NEGATIVE NEGATIVE   Leukocytes,Ua SMALL (A) NEGATIVE   Squamous Epithelial / HPF 11-20 0 - 5 /HPF   WBC, UA 11-20 0 - 5 WBC/hpf   RBC / HPF 0-5 0 - 5 RBC/hpf   Bacteria, UA FEW (A) NONE SEEN   WBC Clumps PRESENT   Pregnancy, urine     Status: None   Collection Time: 09/09/23 10:29 AM  Result Value Ref Range   Preg Test, Ur NEGATIVE NEGATIVE   No results found.  ED Clinical Impression  1. Acute cystitis without hematuria   2. Nausea without vomiting      ED  Assessment/Plan     Urine pregnancy negative.  UA suggestive of UTI with small leukocytes and no bacteria WBCs, but it is a contaminated specimen.  Will send off for culture to confirm antibiotic choice.  Swab for BV and yeast sent.  Suspect musculoskeletal back pain given that she was on her feet all day yesterday.  She has no CVAT, flank tenderness.  Will send home with Pyridium, Macrobid , Tylenol /ibuprofen  for UTI.  Will go ahead and treat presumptively for  BV given the discharge and recent menses.  She will discontinue the Flagyl  if her swab comes back negative for BV.  Zofran  for nausea.  Follow-up with PCP if not better in several days.  ER return precautions given.  Discussed labs, , MDM, treatment plan, and plan for follow-up with patient. Discussed sn/sx that should prompt return to the ED. patient agrees with plan.   Meds ordered this encounter  Medications   ondansetron  (ZOFRAN -ODT) disintegrating tablet 4 mg   nitrofurantoin , macrocrystal-monohydrate, (MACROBID ) 100 MG capsule    Sig: Take 1 capsule (100 mg total) by mouth 2 (two) times daily for 5 days.    Dispense:  10 capsule    Refill:  0   phenazopyridine (PYRIDIUM) 200 MG tablet    Sig: Take 1 tablet (200 mg total) by mouth 3 (three) times daily as needed for pain.    Dispense:  6 tablet    Refill:  0   ondansetron  (ZOFRAN -ODT) 4 MG disintegrating tablet    Sig: Take 1 tablet (4 mg total) by mouth every 8 (eight) hours as needed for nausea or vomiting.    Dispense:  20 tablet    Refill:  0   ibuprofen  (ADVIL ) 600 MG tablet    Sig: Take 1 tablet (600 mg total) by mouth every 8 (eight) hours as needed.    Dispense:  30 tablet    Refill:  0      *This clinic note was created using Scientist, clinical (histocompatibility and immunogenetics). Therefore, there may be occasional mistakes despite careful proofreading.  ?    Ethlyn Herd, MD 09/10/23 [phone]

## 2023-09-09 NOTE — ED Triage Notes (Signed)
 Patient c/o urinary frequency, lower back pain and nausea that started on Friday.  Patient unsure of fevers.

## 2023-09-10 LAB — URINE CULTURE

## 2023-09-10 LAB — CERVICOVAGINAL ANCILLARY ONLY
Bacterial Vaginitis (gardnerella): POSITIVE — AB
Candida Glabrata: NEGATIVE
Candida Vaginitis: NEGATIVE
Comment: NEGATIVE
Comment: NEGATIVE
Comment: NEGATIVE

## 2023-09-11 ENCOUNTER — Other Ambulatory Visit: Payer: Self-pay

## 2023-09-11 ENCOUNTER — Ambulatory Visit: Payer: 59 | Admitting: Physical Therapy

## 2023-09-11 ENCOUNTER — Emergency Department
Admission: EM | Admit: 2023-09-11 | Discharge: 2023-09-11 | Disposition: A | Discharge status: Home / Self Care | Attending: Emergency Medicine | Admitting: Emergency Medicine

## 2023-09-11 ENCOUNTER — Emergency Department

## 2023-09-11 DIAGNOSIS — R42 Dizziness and giddiness: Secondary | ICD-10-CM

## 2023-09-11 DIAGNOSIS — R200 Anesthesia of skin: Secondary | ICD-10-CM | POA: Diagnosis not present

## 2023-09-11 DIAGNOSIS — G43109 Migraine with aura, not intractable, without status migrainosus: Secondary | ICD-10-CM | POA: Diagnosis not present

## 2023-09-11 DIAGNOSIS — R202 Paresthesia of skin: Secondary | ICD-10-CM

## 2023-09-11 DIAGNOSIS — G43909 Migraine, unspecified, not intractable, without status migrainosus: Secondary | ICD-10-CM | POA: Diagnosis not present

## 2023-09-11 HISTORY — DX: Disorder of kidney and ureter, unspecified: N28.9

## 2023-09-11 HISTORY — DX: Tachycardia, unspecified: R00.0

## 2023-09-11 LAB — COMPREHENSIVE METABOLIC PANEL WITH GFR
ALT: 16 U/L (ref 0–44)
AST: 18 U/L (ref 15–41)
Albumin: 4.4 g/dL (ref 3.5–5.0)
Alkaline Phosphatase: 57 U/L (ref 38–126)
Anion gap: 9 (ref 5–15)
BUN: 12 mg/dL (ref 6–20)
CO2: 20 mmol/L — ABNORMAL LOW (ref 22–32)
Calcium: 9.3 mg/dL (ref 8.9–10.3)
Chloride: 108 mmol/L (ref 98–111)
Creatinine, Ser: 0.6 mg/dL (ref 0.44–1.00)
GFR, Estimated: >60 mL/min (ref >60)
Glucose, Bld: 105 mg/dL — ABNORMAL HIGH (ref 70–99)
Potassium: 3.9 mmol/L (ref 3.5–5.1)
Sodium: 137 mmol/L (ref 135–145)
Total Bilirubin: 0.6 mg/dL (ref 0.0–1.2)
Total Protein: 7.8 g/dL (ref 6.5–8.1)

## 2023-09-11 LAB — URINALYSIS, ROUTINE W REFLEX MICROSCOPIC
Bilirubin Urine: NEGATIVE
Glucose, UA: NEGATIVE mg/dL
Hgb urine dipstick: NEGATIVE
Ketones, ur: NEGATIVE mg/dL
Nitrite: NEGATIVE
Protein, ur: NEGATIVE mg/dL
Specific Gravity, Urine: 1.006 (ref 1.005–1.030)
pH: 5 (ref 5.0–8.0)

## 2023-09-11 LAB — CBC
HCT: 43.8 % (ref 36.0–46.0)
Hemoglobin: 14.8 g/dL (ref 12.0–15.0)
MCH: 30.8 pg (ref 26.0–34.0)
MCHC: 33.8 g/dL (ref 30.0–36.0)
MCV: 91.1 fL (ref 80.0–100.0)
Platelets: 278 10*3/uL (ref 150–400)
RBC: 4.81 MIL/uL (ref 3.87–5.11)
RDW: 12.2 % (ref 11.5–15.5)
WBC: 7.5 10*3/uL (ref 4.0–10.5)
nRBC: 0 % (ref 0.0–0.2)

## 2023-09-11 LAB — DIFFERENTIAL
Abs Immature Granulocytes: 0.02 10*3/uL (ref 0.00–0.07)
Basophils Absolute: 0.1 10*3/uL (ref 0.0–0.1)
Basophils Relative: 1 %
Eosinophils Absolute: 0.4 10*3/uL (ref 0.0–0.5)
Eosinophils Relative: 5 %
Immature Granulocytes: 0 %
Lymphocytes Relative: 32 %
Lymphs Abs: 2.4 10*3/uL (ref 0.7–4.0)
Monocytes Absolute: 0.5 10*3/uL (ref 0.1–1.0)
Monocytes Relative: 7 %
Neutro Abs: 4.2 10*3/uL (ref 1.7–7.7)
Neutrophils Relative %: 55 %

## 2023-09-11 LAB — POC URINE PREG, ED: Preg Test, Ur: NEGATIVE

## 2023-09-11 LAB — PROTIME-INR
INR: 1 (ref 0.8–1.2)
Prothrombin Time: 13.6 s (ref 11.4–15.2)

## 2023-09-11 LAB — ETHANOL: Alcohol, Ethyl (B): <15 mg/dL (ref <15)

## 2023-09-11 LAB — APTT: aPTT: 27 s (ref 24–36)

## 2023-09-11 MED ORDER — PROCHLORPERAZINE MALEATE 5 MG PO TABS
5.0000 mg | ORAL_TABLET | Freq: Four times a day (QID) | ORAL | 0 refills | Status: DC | PRN
  Filled 2023-09-11: qty 12, 3d supply, fill #0

## 2023-09-11 NOTE — ED Triage Notes (Addendum)
 Pt to ED POV for R arm numbness and tingling that she first noticed around 12pm today. Pt has no motor weakness. Pt states also shoulder pain since about 30 minutes ago. States has had similar symptoms intermittently for last 2 weeks (same arm).  Pt states she has noticed intermittent Pt states she has been experiencing vertigo-like symptoms on and off all day today, like room was spinning and at these times she also felt kind of confused and unsure of where she was. Denies learning to 1 side. Pt has no arm drift. Face symmetrical. Speech clear. Denies OCPs. Negative home preg test 3 days ago. At this moment pt not dizzy. Has steady gait.   States was also recently on abx for UTI and would like UA.

## 2023-09-11 NOTE — ED Provider Notes (Signed)
 [facility] Provider Note    Event Date/Time   First MD Initiated Contact with Patient 09/11/23 2044     (approximate)   History   Chief Complaint Numbness and Dizziness   HPI  Elizabeth Berger is a 33 y.o. female with past medical history of migraines and anxiety who presents to the ED complaining of numbness and dizziness.  Patient reports that she has been dealing with intermittent numbness and tingling in her right arm for the past 2 weeks, acutely worse around 12:00 today.  She has also been dealing with a gradually worsening headache for the past 3 days that has been associated with light sensitivity.  She denies any vision changes or speech changes, does state that her right arm has felt "fatigued" but she denies any obvious weakness or difficulty using the arm.  She has felt dizzy at times today, which she describes as both a lightheadedness and feeling like things were spinning around her.  She states that dizziness has resolved since she has arrived to the ED, but she still has numbness and tingling in her right arm.     Physical Exam   Triage Vital Signs: ED Triage Vitals  Encounter Vitals Group     BP 09/11/23 1800 (!) 115/93     Systolic BP Percentile --      Diastolic BP Percentile --      Pulse Rate 09/11/23 1800 (!) 103     Resp 09/11/23 1800 16     Temp 09/11/23 1800 98.4 F (36.9 C)     Temp Source 09/11/23 1800 Oral     SpO2 09/11/23 1800 99 %     Weight 09/11/23 1801 150 lb (68 kg)     Height 09/11/23 1801 5\' 2"  (1.575 m)     Head Circumference --      Peak Flow --      Pain Score 09/11/23 1756 4     Pain Loc --      Pain Education --      Exclude from Growth Chart --     Most recent vital signs: Vitals:   09/11/23 1800 09/11/23 2148  BP: (!) 115/93 107/71  Pulse: (!) 103 (!) 111  Resp: 16 18  Temp: 98.4 F (36.9 C) 98.4 F (36.9 C)  SpO2: 99% 90%    Constitutional: Alert and oriented. Eyes: Conjunctivae are  normal.  Pupils equal, round, and reactive to light bilaterally. Head: Atraumatic. Nose: No congestion/rhinnorhea. Mouth/Throat: Mucous membranes are moist.  Cardiovascular: Normal rate, regular rhythm. Grossly normal heart sounds.  2+ radial pulses bilaterally. Respiratory: Normal respiratory effort.  No retractions. Lungs CTAB. Gastrointestinal: Soft and nontender. No distention. Musculoskeletal: No lower extremity tenderness nor edema.  Neurologic:  Normal speech and language. No gross focal neurologic deficits are appreciated.    ED Results / Procedures / Treatments   Labs (all labs ordered are listed, but only abnormal results are displayed) Labs Reviewed  COMPREHENSIVE METABOLIC PANEL WITH GFR - Abnormal; Notable for the following components:      Result Value   CO2 20 (*)    Glucose, Bld 105 (*)    All other components within normal limits  URINALYSIS, ROUTINE W REFLEX MICROSCOPIC - Abnormal; Notable for the following components:   Color, Urine YELLOW (*)    APPearance HAZY (*)    Leukocytes,Ua SMALL (*)    Bacteria, UA RARE (*)    All other components within normal limits  PROTIME-INR  APTT  CBC  DIFFERENTIAL  ETHANOL  POC URINE PREG, ED     EKG  ED ECG REPORT I, Twilla Galea, the attending physician, personally viewed and interpreted this ECG.   Date: 09/11/2023  EKG Time: 21:45  Rate: 82  Rhythm: normal sinus rhythm  Axis: Normal  Intervals:none  ST&T Change: None  RADIOLOGY CT head reviewed and interpreted by me with no hemorrhage or midline shift.  PROCEDURES:  Critical Care performed: No  Procedures   MEDICATIONS ORDERED IN ED: Medications - No data to display   IMPRESSION / MDM / ASSESSMENT AND PLAN / ED COURSE  I reviewed the triage vital signs and the nursing notes.                              33 y.o. female with past medical history of migraines and anxiety who presents to the ED complaining of intermittent numbness and tingling  in her right arm for the past 2 weeks, worse today with 3 days of worsening headache and light sensitivity.  Patient's presentation is most consistent with acute presentation with potential threat to life or bodily function.  Differential diagnosis includes, but is not limited to, stroke, TIA, complicated migraine, SAH, meningitis, cervical radiculopathy, paresthesia, anemia, electrolyte abnormality.  Patient nontoxic-appearing and in no acute distress, vital signs are markable for tachycardia but otherwise reassuring.  Patient has a nonfocal neurologic exam and CT head is negative for acute process, very low suspicion for stroke at this time.  With her headache and light sensitivity, complicated migraine seems more likely and would also consider cervical radiculopathy.  No findings concerning for cervical myelopathy.  Labs are reassuring with no significant anemia, leukocytosis, electrolyte abnormality, or AKI.  Pregnancy testing is negative and LFTs are unremarkable.  Patient was offered IV Compazine  and Benadryl  for treatment of migraine, but declines and is requesting to be discharged home.  She was counseled to follow-up with her PCP and to return to the ED for new or worsening symptoms, patient agrees with plan.      FINAL CLINICAL IMPRESSION(S) / ED DIAGNOSES   Final diagnoses:  Complicated migraine  Dizziness  Paresthesia     Rx / DC Orders   ED Discharge Orders          Ordered    prochlorperazine  (COMPAZINE ) 5 MG tablet  Every 6 hours PRN        09/11/23 2156             Note:  This document was prepared using Dragon voice recognition software and may include unintentional dictation errors.   Twilla Galea, MD 09/11/23 2213

## 2023-09-12 ENCOUNTER — Other Ambulatory Visit: Payer: Self-pay

## 2023-09-13 ENCOUNTER — Encounter: Payer: Self-pay | Admitting: Dietician

## 2023-09-13 ENCOUNTER — Ambulatory Visit: Payer: Self-pay

## 2023-09-13 ENCOUNTER — Ambulatory Visit: Payer: 59 | Admitting: Physical Therapy

## 2023-09-13 NOTE — Progress Notes (Signed)
 Have not heard back from patient to reschedule her cancelled appointment from 07/16/23. Sent notification to referring provider.

## 2023-09-13 NOTE — Telephone Encounter (Signed)
 Copied from CRM [id number]. Topic: Clinical - Red Word Triage >> Sep 13, 2023  8:02 AM Emylou G wrote: [facility] that prompted transfer to Nurse Triage: antiobiotics not working.. uti.. UC on sunday.. urgency, burning, and nausea  Chief Complaint: urgency, burning, nausea, seen in UC on Sunday Symptoms: see above Frequency: constant Pertinent Negatives: Patient denies fever, flank pain, blood in urine Disposition: [] ED /[] Urgent Care (no appt availability in office) / [x] Appointment(In office/virtual)/ []  [place] [facility]/ [] Home Care/ [] Refused Recommended Disposition /[] [place] [facility]/ []  Follow-up with PCP Additional Notes: per protocol apt made; care advice given, denies questions; instructed to go to ER if becomes worse.   Reason for Disposition  [1] Taking antibiotic > 24 hours for UTI (urinary tract or bladder infection) AND [2] fever persists (still has fever)  Answer Assessment - Initial Assessment Questions 1. MAIN SYMPTOM: "What is the main symptom you are concerned about?" (e.g., painful urination, urine frequency)     Urgency, burning, nausea,  2. BETTER-SAME-WORSE: "Are you getting better, staying the same, or getting worse compared to how you felt at your last visit to the doctor (most recent medical visit)?"     worse 3. PAIN: "How bad is the pain?"  (e.g., Scale 1-10; mild, moderate, or severe)   - MILD (1-3): complains slightly about urination hurting   - MODERATE (4-7): interferes with normal activities     - SEVERE (8-10): excruciating, unwilling or unable to urinate because of the pain      moderate 4. FEVER: "Do you have a fever?" If Yes, ask: "What is it, how was it measured, and when did it start?"     denies 5. OTHER SYMPTOMS: "Do you have any other symptoms?" (e.g., blood in the urine, flank pain, vaginal discharge)     See above 6. DIAGNOSIS: "When was the UTI diagnosed?" "By whom?" "Was it a kidney infection, bladder infection or both?"      Sunday 7. ANTIBIOTIC: "What antibiotic(s) are you taking?" "How many times per day?"     Nitrofurantoin  8. ANTIBIOTIC - START DATE: "When did you start taking the antibiotic?"     Sunday and today is last day  Protocols used: Urinary Tract Infection on Antibiotic Follow-up Call - [facility]

## 2023-09-14 ENCOUNTER — Encounter: Payer: Self-pay | Admitting: Physician Assistant

## 2023-09-14 ENCOUNTER — Ambulatory Visit: Admitting: Physician Assistant

## 2023-09-14 VITALS — BP 109/76 | HR 99 | Resp 16 | Ht 62.0 in | Wt 150.0 lb

## 2023-09-14 DIAGNOSIS — K5909 Other constipation: Secondary | ICD-10-CM

## 2023-09-14 DIAGNOSIS — N760 Acute vaginitis: Secondary | ICD-10-CM

## 2023-09-14 DIAGNOSIS — R300 Dysuria: Secondary | ICD-10-CM | POA: Diagnosis not present

## 2023-09-14 DIAGNOSIS — B9689 Other specified bacterial agents as the cause of diseases classified elsewhere: Secondary | ICD-10-CM

## 2023-09-14 DIAGNOSIS — N393 Stress incontinence (female) (male): Secondary | ICD-10-CM

## 2023-09-14 LAB — POCT URINALYSIS DIPSTICK
Blood, UA: NEGATIVE
Glucose, UA: NEGATIVE
Ketones, UA: NEGATIVE
Nitrite, UA: NEGATIVE
Protein, UA: NEGATIVE
Spec Grav, UA: 1.01 (ref 1.010–1.025)
Urobilinogen, UA: 0.2 U/dL
pH, UA: 5 (ref 5.0–8.0)

## 2023-09-14 NOTE — Progress Notes (Signed)
 Established patient visit  Patient: Elizabeth Berger   DOB: December 12, 1990   32 y.o. Female  MRN: [id number] Visit Date: 09/14/2023  Today's healthcare provider: Blane Bunting, PA-C   Chief Complaint  Patient presents with   Urinary Tract Infection    Possible UTI? X2 wk was given meds with no relief.   Subjective     Discussed the use of AI scribe software for clinical note transcription with the patient, who gave verbal consent to proceed.  History of Present Illness Elizabeth Berger "[name]" is a 33 year old female who presents with persistent urinary symptoms despite treatment for a UTI.  She experiences a burning sensation in the low pelvic area, even when not urinating, and urinary urgency with little to no urine output. These symptoms persist despite completing a course of nitrofurantoin  for a UTI. She is also taking metronidazole  for bacterial vaginosis, scheduled to finish tomorrow.  She has provided four urine samples this week, with the first two being inconclusive. She visited urgent care on Sunday and the emergency department earlier this week, where she provided another urine sample. She is concerned about the lack of improvement in her symptoms.  She has experienced between two and four UTIs since the birth of her child two years ago. She is undergoing physical therapy for stress urinary incontinence, noting leakage when coughing or laughing.  There is no fever or significant pain currently, although she experienced back pain on Sunday. She feels very tired and has been dealing with constipation this week.       09/14/2023    9:09 AM 06/07/2023   11:57 AM 05/18/2023    3:46 PM  Depression screen PHQ 2/9  Decreased Interest 1 0 0  Down, Depressed, Hopeless 0 0 0  PHQ - 2 Score 1 0 0  Altered sleeping 2  0  Tired, decreased energy 1  0  Change in appetite 0  0  Feeling bad or failure about yourself  0  0  Trouble concentrating 1  0  Moving slowly or  fidgety/restless 0  0  Suicidal thoughts 0  0  PHQ-9 Score 5  0  Difficult doing work/chores Not difficult at all  Not difficult at all      09/14/2023    9:10 AM 12/15/2022    4:30 PM 11/15/2022    1:26 PM 10/12/2022    3:59 PM  GAD 7 : Generalized Anxiety Score  Nervous, Anxious, on Edge 1 1 3 3   Control/stop worrying 0 0 3 3  Worry too much - different things 0 0 3 3  Trouble relaxing 1 1 3 1   Restless 0 1 3 3   Easily annoyed or irritable 0 0 0 3  Afraid - awful might happen 0 0 0 3  Total GAD 7 Score 2 3 15 19   Anxiety Difficulty Not difficult at all  Somewhat difficult Not difficult at all    Medications: Outpatient Medications Prior to Visit  Medication Sig   hydrOXYzine  (ATARAX ) 10 MG tablet Take 1 tablet (10 mg total) by mouth every 6 (six) hours as needed for anxiety (sleep).   ibuprofen  (ADVIL ) 600 MG tablet Take 1 tablet (600 mg total) by mouth every 8 (eight) hours as needed.   metroNIDAZOLE  (FLAGYL ) 500 MG tablet Take 1 tablet (500 mg total) by mouth 2 (two) times daily for 7 days.   nitrofurantoin , macrocrystal-monohydrate, (MACROBID ) 100 MG capsule Take 1 capsule (100 mg total) by mouth 2 (two) times daily  for 5 days.   ondansetron  (ZOFRAN -ODT) 4 MG disintegrating tablet Take 1 tablet (4 mg total) by mouth every 8 (eight) hours as needed for nausea or vomiting.   phenazopyridine  (PYRIDIUM ) 200 MG tablet Take 1 tablet (200 mg total) by mouth 3 (three) times daily as needed for pain.   prochlorperazine  (COMPAZINE ) 5 MG tablet Take 1 tablet (5 mg total) by mouth every 6 (six) hours as needed for nausea or vomiting.   propranolol  (INDERAL ) 10 MG tablet Take 1 tablet (10 mg total) by mouth 3 (three) times daily.   venlafaxine  XR (EFFEXOR  XR) 75 MG 24 hr capsule Take 1 capsule (75 mg total) by mouth at bedtime.   norethindrone  (INCASSIA ) 0.35 MG tablet Take 1 tablet (0.35 mg total) by mouth daily. (Patient not taking: Reported on 09/14/2023)   No facility-administered  medications prior to visit.    Review of Systems All negative Except see HPI       Objective    BP 109/76 (BP Location: Right Arm, Patient Position: Sitting, Cuff Size: Normal)   Pulse 99   Resp 16   Ht 5\' 2"  (1.575 m)   Wt 150 lb (68 kg)   LMP 09/04/2023 (Approximate)   SpO2 97%   BMI 27.44 kg/m     Physical Exam Constitutional:      General: She is not in acute distress.    Appearance: Normal appearance.  HENT:     Head: Normocephalic.  Pulmonary:     Effort: Pulmonary effort is normal. No respiratory distress.  Neurological:     Mental Status: She is alert and oriented to person, place, and time. Mental status is at baseline.     Results for orders placed or performed in visit on 09/14/23  POCT urinalysis dipstick  Result Value Ref Range   Color, UA yellow    Clarity, UA clear    Glucose, UA Negative Negative   Bilirubin, UA Small    Ketones, UA negative    Spec Grav, UA 1.010 1.010 - 1.025   Blood, UA negative    pH, UA 5.0 5.0 - 8.0   Protein, UA Negative Negative   Urobilinogen, UA 0.2 0.2 or 1.0 E.U./dL   Nitrite, UA negative    Leukocytes, UA Moderate (2+) (A) Negative   Appearance     Odor          Assessment & Plan Urinary tract infection X 2 weeks Symptoms persist despite nitrofurantoin , raising concern for antibiotic resistance.  - Order urine culture for antibiotic sensitivity. - Advise continued AZO use and increased fluid intake. - Consider urology referral if culture inconclusive.  Urinary incontinence Stress incontinence with suspected pelvic floor dysfunction. Continue physical therapy for pelvic floor dysfunction. Consider a referral to urogynecology  Bacterial vaginosis acute Currently on metronidazole . BV may affect urine culture results. Pt is aware and agreed to proceed with testing  Constipation Chronic Constipation may contribute to pelvic floor dysfunction. - Advise dietary changes including yogurt and prune  juice. - Recommend laxatives if necessary. Will follow-up   Orders Placed This Encounter  Procedures   Urine Culture   Urinalysis, microscopic only   POCT urinalysis dipstick    No follow-ups on file.   The patient was advised to call back or seek an in-person evaluation if the symptoms worsen or if the condition fails to improve as anticipated.  I discussed the assessment and treatment plan with the patient. The patient was provided an opportunity to ask questions and all  were answered. The patient agreed with the plan and demonstrated an understanding of the instructions.  I, Ernesto Zukowski, PA-C have reviewed all documentation for this visit. The documentation on 09/14/2023  for the exam, diagnosis, procedures, and orders are all accurate and complete.  Blane Bunting, [facility], MMS [facility] [phone] (phone) [phone] (fax)  [facility]

## 2023-09-15 DIAGNOSIS — B9689 Other specified bacterial agents as the cause of diseases classified elsewhere: Secondary | ICD-10-CM | POA: Insufficient documentation

## 2023-09-15 DIAGNOSIS — K5909 Other constipation: Secondary | ICD-10-CM | POA: Insufficient documentation

## 2023-09-15 DIAGNOSIS — R300 Dysuria: Secondary | ICD-10-CM | POA: Insufficient documentation

## 2023-09-15 DIAGNOSIS — N393 Stress incontinence (female) (male): Secondary | ICD-10-CM | POA: Insufficient documentation

## 2023-09-15 LAB — URINALYSIS, MICROSCOPIC ONLY
Casts: NONE SEEN /LPF
Epithelial Cells (non renal): >10 /HPF — AB (ref 0–10)
RBC, Urine: NONE SEEN /HPF (ref 0–2)
WBC, UA: >30 /HPF — AB (ref 0–5)

## 2023-09-16 ENCOUNTER — Encounter: Payer: Self-pay | Admitting: Intensive Care

## 2023-09-16 ENCOUNTER — Emergency Department
Admission: EM | Admit: 2023-09-16 | Discharge: 2023-09-16 | Disposition: A | Discharge status: Home / Self Care | Attending: Emergency Medicine | Admitting: Emergency Medicine

## 2023-09-16 ENCOUNTER — Other Ambulatory Visit: Payer: Self-pay

## 2023-09-16 DIAGNOSIS — R55 Syncope and collapse: Secondary | ICD-10-CM | POA: Diagnosis not present

## 2023-09-16 LAB — URINALYSIS, ROUTINE W REFLEX MICROSCOPIC
Bilirubin Urine: NEGATIVE
Glucose, UA: NEGATIVE mg/dL
Hgb urine dipstick: NEGATIVE
Ketones, ur: NEGATIVE mg/dL
Leukocytes,Ua: NEGATIVE
Nitrite: NEGATIVE
Protein, ur: NEGATIVE mg/dL
Specific Gravity, Urine: 1.001 — ABNORMAL LOW (ref 1.005–1.030)
pH: 7 (ref 5.0–8.0)

## 2023-09-16 LAB — MAGNESIUM: Magnesium: 2.3 mg/dL (ref 1.7–2.4)

## 2023-09-16 LAB — COMPREHENSIVE METABOLIC PANEL WITH GFR
ALT: 19 U/L (ref 0–44)
AST: 22 U/L (ref 15–41)
Albumin: 4.5 g/dL (ref 3.5–5.0)
Alkaline Phosphatase: 55 U/L (ref 38–126)
Anion gap: 9 (ref 5–15)
BUN: 13 mg/dL (ref 6–20)
CO2: 22 mmol/L (ref 22–32)
Calcium: 9.3 mg/dL (ref 8.9–10.3)
Chloride: 108 mmol/L (ref 98–111)
Creatinine, Ser: 0.78 mg/dL (ref 0.44–1.00)
GFR, Estimated: >60 mL/min (ref >60)
Glucose, Bld: 142 mg/dL — ABNORMAL HIGH (ref 70–99)
Potassium: 3.9 mmol/L (ref 3.5–5.1)
Sodium: 139 mmol/L (ref 135–145)
Total Bilirubin: 0.5 mg/dL (ref 0.0–1.2)
Total Protein: 7.9 g/dL (ref 6.5–8.1)

## 2023-09-16 LAB — POC URINE PREG, ED: Preg Test, Ur: NEGATIVE

## 2023-09-16 LAB — CBC
HCT: 43.5 % (ref 36.0–46.0)
Hemoglobin: 14.7 g/dL (ref 12.0–15.0)
MCH: 31.3 pg (ref 26.0–34.0)
MCHC: 33.8 g/dL (ref 30.0–36.0)
MCV: 92.8 fL (ref 80.0–100.0)
Platelets: 264 10*3/uL (ref 150–400)
RBC: 4.69 MIL/uL (ref 3.87–5.11)
RDW: 12.3 % (ref 11.5–15.5)
WBC: 7.7 10*3/uL (ref 4.0–10.5)
nRBC: 0 % (ref 0.0–0.2)

## 2023-09-16 LAB — LACTIC ACID, PLASMA
Lactic Acid, Venous: 2.6 mmol/L (ref 0.5–1.9)
Lactic Acid, Venous: 1.3 mmol/L (ref 0.5–1.9)

## 2023-09-16 MED ORDER — LORAZEPAM 1 MG PO TABS
1.0000 mg | ORAL_TABLET | Freq: Once | ORAL | Status: AC
  Administered 2023-09-16: 1 mg via ORAL
  Filled 2023-09-16: qty 1

## 2023-09-16 MED ORDER — LACTATED RINGERS IV BOLUS
1000.0000 mL | Freq: Once | INTRAVENOUS | Status: AC
  Administered 2023-09-16: 1000 mL via INTRAVENOUS

## 2023-09-16 NOTE — Discharge Instructions (Addendum)
 No signs of UTI but we did send her urine for a culture.  Normal blood counts, kidney function, electrolytes.   Return to the ED with any worsening symptoms

## 2023-09-16 NOTE — ED Provider Notes (Signed)
 [facility] Provider Note    Event Date/Time   First MD Initiated Contact with Patient 09/16/23 1817     (approximate)   History   Near Syncope   HPI  Elizabeth Berger is a 33 y.o. female who presents to the ED for evaluation of Near Syncope   Review urgent care visit from 5/11, ED visit from 5/13 and clinic visit from 5/16.  History of anxiety, migraines.  Diagnosed with UTI in 5/11, I reviewed urine culture that is contaminated with multiple species.  Also diagnosed with bacterial vaginosis.  Patient resents for evaluation of various acute on chronic symptoms that occurred today.  For the past 2 years, she reports not feeling right ever since her daughter was born.  Reports that she was laying in bed all day today with poor motivation and energy.  She got up to go to the bathroom and felt lightheaded dizziness, then she "freaked herself out" and reports starting feeling worse with worsening dizziness, feeling flushed.   Reports sensation of urge to void frequently for the past 2 years since her birth, attributed to pelvic floor dysfunction.  No dysuria or acute urinary symptoms  She is finishing her metronidazole  for BV tonight, already finished nitrofurantoin  for UTI earlier this week.  No fevers, syncope, chest pain or headache.   Physical Exam   Triage Vital Signs: ED Triage Vitals [09/16/23 1811]  Encounter Vitals Group     BP (!) 142/97     Systolic BP Percentile      Diastolic BP Percentile      Pulse Rate (!) 136     Resp (!) 22     Temp 98.6 F (37 C)     Temp Source Oral     SpO2 100 %     Weight 150 lb (68 kg)     Height 5\' 2"  (1.575 m)     Head Circumference      Peak Flow      Pain Score 0     Pain Loc      Pain Education      Exclude from Growth Chart     Most recent vital signs: Vitals:   09/16/23 1811 09/16/23 1825  BP: (!) 142/97   Pulse: (!) 136   Resp: (!) 22   Temp: 98.6 F (37 C)   SpO2: 100% 100%     General: Awake, no distress.  Well-appearing, tearful, anxious CV:  Good peripheral perfusion.  Tachycardic and regular Resp:  Normal effort.  Abd:  No distention.  MSK:  No deformity noted.  Neuro:  No focal deficits appreciated. Other:     ED Results / Procedures / Treatments   Labs (all labs ordered are listed, but only abnormal results are displayed) Labs Reviewed  COMPREHENSIVE METABOLIC PANEL WITH GFR - Abnormal; Notable for the following components:      Result Value   Glucose, Bld 142 (*)    All other components within normal limits  URINALYSIS, ROUTINE W REFLEX MICROSCOPIC - Abnormal; Notable for the following components:   Color, Urine COLORLESS (*)    APPearance CLEAR (*)    Specific Gravity, Urine 1.001 (*)    All other components within normal limits  LACTIC ACID, PLASMA - Abnormal; Notable for the following components:   Lactic Acid, Venous 2.6 (*)    All other components within normal limits  URINE CULTURE  CBC  MAGNESIUM  LACTIC ACID, PLASMA  POC URINE PREG, ED  EKG Sinus tachycardia with a rate of 135 bpm.  Normal axis.  QTc 570.  No clear signs of acute ischemia.  Repeat EKG with a sinus rhythm, rate of 96 bpm.  Normal axis and intervals without evidence of acute ischemia.  QTc 449.  RADIOLOGY   Official radiology report(s): No results found.  PROCEDURES and INTERVENTIONS:  .1-3 Lead EKG Interpretation  Performed by: Arline Bennett, MD Authorized by: Arline Bennett, MD     Interpretation: normal     ECG rate:  92   ECG rate assessment: normal     Rhythm: sinus rhythm     Ectopy: none     Conduction: normal     Medications  lactated ringers  bolus 1,000 mL (0 mLs Intravenous Stopped 09/16/23 2001)  LORazepam  (ATIVAN ) tablet 1 mg (1 mg Oral Given 09/16/23 1857)     IMPRESSION / MDM / ASSESSMENT AND PLAN / ED COURSE  I reviewed the triage vital signs and the nursing notes.  Differential diagnosis includes, but is not limited to,  cardiac dysrhythmia, PE, anxiety, dehydration, sepsis or pyelonephritis  {Patient presents with symptoms of an acute illness or injury that is potentially life-threatening.  Patient presents for evaluation of an episode of near syncope that I suspect is most likely related to anxiety.  She reports miscellaneous chronic symptoms for the past 2 years since the birth of her child.  Has some chronic pelvic pressure and stress incontinence as well, likely due to pelvic floor dysfunction in the setting of vaginal birth 2 years ago.  Urinalysis here without concerning infectious features, but we will send for a culture considering her symptoms.  EKG does have a prolonged QTc in the setting of sinus tachycardia.  She has normal electrolytes.  We will repeat this EKG after her rates are slow.  On the monitor, her heart rates range 80s-120s.  She reports feeling much better after fluids, reassurance given before Ativan  is provided.  No ventricular dysrhythmias, though this is a possibility albeit unlikely in the setting of her prolonged QTc and episode of dizziness.  Heart rate escalates dramatically whenever I enter the room and I suspect a significant component of anxiety.  Does have a lactic acidosis, possibly related to her tachycardia.  We will repeat this after IV fluids.    I considered PE, D-dimer, CTA chest but think it is quite unlikely as she is quite low risk though I cannot use PERC criteria, she is low risk for [place].  Clinical Course as of 09/16/23 2007  Sun Sep 16, 2023  1944 Reassessed.  Feeling better [DS]  2006 Reassessed, repeat EKG, as documented above.  She looks well and reports feeling better [DS]    Clinical Course User Index [DS] Arline Bennett, MD     FINAL CLINICAL IMPRESSION(S) / ED DIAGNOSES   Final diagnoses:  Near syncope     Rx / DC Orders   ED Discharge Orders     None        Note:  This document was prepared using Dragon voice recognition software  and may include unintentional dictation errors.   Arline Bennett, MD 09/16/23 2008

## 2023-09-16 NOTE — ED Notes (Signed)
 CCMD called pt placed on monitoring

## 2023-09-16 NOTE — ED Notes (Signed)
 Scanner not working in room. Pt bracelet appears blurry. Pt given PO meds and IV fluid without scanning.

## 2023-09-16 NOTE — ED Triage Notes (Signed)
 Patient c/o low grade fever and near syncope today. Reports started medication 09/09/23 for UTI X5 days. Seen again on Tuesday and Friday for feeling unwell. Has been feeling nauseas.  C/o urinary frequency with little results today  Patient crying in triage. Takes medication for anxiety

## 2023-09-17 ENCOUNTER — Ambulatory Visit: Payer: Self-pay | Admitting: Physician Assistant

## 2023-09-17 ENCOUNTER — Ambulatory Visit: Payer: 59 | Admitting: Dermatology

## 2023-09-17 LAB — URINE CULTURE

## 2023-09-18 ENCOUNTER — Ambulatory Visit: Payer: 59 | Admitting: Physical Therapy

## 2023-09-18 LAB — URINE CULTURE: Culture: 20000 — AB

## 2023-09-20 ENCOUNTER — Ambulatory Visit: Payer: 59 | Admitting: Physical Therapy

## 2023-09-26 ENCOUNTER — Encounter: Admitting: Physical Therapy

## 2023-09-28 ENCOUNTER — Ambulatory Visit: Payer: Self-pay | Admitting: Family Medicine

## 2023-10-01 ENCOUNTER — Encounter: Admitting: Physical Therapy

## 2023-10-25 ENCOUNTER — Encounter: Payer: Self-pay | Admitting: Family Medicine

## 2023-10-25 ENCOUNTER — Other Ambulatory Visit: Payer: Self-pay

## 2023-10-25 ENCOUNTER — Ambulatory Visit (INDEPENDENT_AMBULATORY_CARE_PROVIDER_SITE_OTHER): Admitting: Family Medicine

## 2023-10-25 VITALS — BP 112/79 | HR 105 | Ht 62.0 in | Wt 150.3 lb

## 2023-10-25 DIAGNOSIS — F410 Panic disorder [episodic paroxysmal anxiety] without agoraphobia: Secondary | ICD-10-CM

## 2023-10-25 DIAGNOSIS — F411 Generalized anxiety disorder: Secondary | ICD-10-CM | POA: Diagnosis not present

## 2023-10-25 DIAGNOSIS — H539 Unspecified visual disturbance: Secondary | ICD-10-CM | POA: Diagnosis not present

## 2023-10-25 DIAGNOSIS — I479 Paroxysmal tachycardia, unspecified: Secondary | ICD-10-CM | POA: Diagnosis not present

## 2023-10-25 MED ORDER — FLUOXETINE HCL 10 MG PO CAPS
10.0000 mg | ORAL_CAPSULE | Freq: Every day | ORAL | 1 refills | Status: DC
Start: 2023-10-25 — End: 2023-12-20
  Filled 2023-10-25 (×2): qty 30, 30d supply, fill #0
  Filled 2023-11-26: qty 30, 30d supply, fill #1

## 2023-10-25 MED ORDER — HYDROXYZINE HCL 10 MG PO TABS
10.0000 mg | ORAL_TABLET | Freq: Four times a day (QID) | ORAL | 3 refills | Status: AC | PRN
  Filled 2023-10-25 (×2): qty 90, 23d supply, fill #0
  Filled 2024-03-10: qty 90, 23d supply, fill #1
  Filled 2024-04-28: qty 90, 23d supply, fill #2
  Filled 2024-05-08: qty 90, 23d supply, fill #0

## 2023-10-25 NOTE — Progress Notes (Signed)
 Established Patient Office Visit  Introduced to nurse practitioner role and practice setting.  All questions answered.  Discussed provider/patient relationship and expectations.  Subjective   Patient ID: Elizabeth Berger, female    DOB: 07/02/1990  Age: 33 y.o. MRN: [id number]  Chief Complaint  Patient presents with   Medical Management of Chronic Issues    Patient is present for 4 month follow-up    Discussed the use of AI scribe software for clinical note transcription with the patient, who gave verbal consent to proceed.  History of Present Illness Elizabeth Berger Myeesha Shane is a 33 year old female who presents with increased anxiety and neck pain.  She experiences significant anxiety, characterized by episodes of crying. These feelings are amplified for social situations. (Major social anxiety)Her anxiety fluctuates, with periods of good months followed by months of persistent anxiety. Anxiety worst when she thinks about her health, often fearing the worst when experiencing symptoms.. She has previously been on Effexor , which she disliked, and recalls taking Zoloft  in her teens but does not remember its effects. She uses ativan  Prn sparingly prescribed by previous PCP, and takes propranolol  at night for her heart rate (hx  sinus tachycardia), which also helps with anxiety. She is concerned about weight gain with medication.  Neck pain - Physical therapy has been beneficial  She has a history of elevated heart rate, particularly during her pregnancy when it reached 160-165 bpm. Her heart rate remains over 100 bpm, and she takes propranolol  at night to manage it.  She experiences eye strain, describing a need to squint to focus, which occurs during normal activities at home. She sometimes experiences blurred vision but denies halos. She works on a [employer] and uses blue light glasses, but they do not fully alleviate her symptoms. No major life changes contributing to her anxiety. No  current nausea or bladder symptoms. No double vision, pain with EOMs.      10/25/2023    3:12 PM 09/14/2023    9:09 AM 06/07/2023   11:57 AM  Depression screen PHQ 2/9  Decreased Interest 1 1 0  Down, Depressed, Hopeless 1 0 0  PHQ - 2 Score 2 1 0  Altered sleeping 0 2   Tired, decreased energy 2 1   Change in appetite 0 0   Feeling bad or failure about yourself  0 0   Trouble concentrating 1 1   Moving slowly or fidgety/restless 0 0   Suicidal thoughts 0 0   PHQ-9 Score 5 5   Difficult doing work/chores Somewhat difficult Not difficult at all        10/25/2023    3:13 PM 09/14/2023    9:10 AM 12/15/2022    4:30 PM 11/15/2022    1:26 PM  GAD 7 : Generalized Anxiety Score  Nervous, Anxious, on Edge 1 1 1 3   Control/stop worrying 0 0 0 3  Worry too much - different things 0 0 0 3  Trouble relaxing 1 1 1 3   Restless 1 0 1 3  Easily annoyed or irritable 0 0 0 0  Afraid - awful might happen 0 0 0 0  Total GAD 7 Score 3 2 3 15   Anxiety Difficulty Somewhat difficult Not difficult at all  Somewhat difficult     Review of Systems  All other systems reviewed and are negative.   Negative unless indicated in HPI   Objective:     BP 112/79 (BP Location: Left Arm, Patient Position: Sitting,  Cuff Size: Normal)   Pulse (!) 105   Ht 5' 2 (1.575 m)   Wt 150 lb 4.8 oz (68.2 kg)   BMI 27.49 kg/m    Physical Exam Constitutional:      General: She is not in acute distress.    Appearance: Normal appearance. She is well-developed and well-groomed. She is not toxic-appearing or diaphoretic.  HENT:     Head: Normocephalic.     Nose: Nose normal.     Mouth/Throat:     Mouth: Mucous membranes are moist.     Pharynx: Oropharynx is clear.   Eyes:     General: Lids are normal. Vision grossly intact. No visual field deficit.       Right eye: No foreign body, discharge or hordeolum.        Left eye: No foreign body, discharge or hordeolum.     Extraocular Movements: Extraocular  movements intact.     Right eye: Normal extraocular motion and no nystagmus.     Left eye: Normal extraocular motion.     Conjunctiva/sclera:     Right eye: Right conjunctiva is not injected. No chemosis, exudate or hemorrhage.    Left eye: Left conjunctiva is not injected. No chemosis, exudate or hemorrhage.    Pupils: Pupils are equal, round, and reactive to light.     Visual Fields: Right eye visual fields normal and left eye visual fields normal.    Cardiovascular:     Rate and Rhythm: Normal rate and regular rhythm.     Pulses: Normal pulses.     Heart sounds: Normal heart sounds. No murmur heard.    No friction rub. No gallop.  Pulmonary:     Effort: No respiratory distress.     Breath sounds: No stridor. No wheezing, rhonchi or rales.  Chest:     Chest wall: No tenderness.   Musculoskeletal:     Right lower leg: No edema.     Left lower leg: No edema.   Skin:    General: Skin is warm and dry.     Capillary Refill: Capillary refill takes less than 2 seconds.   Neurological:     General: No focal deficit present.     Mental Status: She is alert and oriented to person, place, and time. Mental status is at baseline.   Psychiatric:        Attention and Perception: Attention and perception normal.        Mood and Affect: Mood is anxious. Affect is tearful.        Speech: Speech normal.        Behavior: Behavior normal. Behavior is cooperative.        Thought Content: Thought content normal.        Cognition and Memory: Cognition and memory normal.        Judgment: Judgment normal.    No results found for any visits on 10/25/23.    The ASCVD Risk score (Arnett DK, et al., 2019) failed to calculate for the following reasons:   The 2019 ASCVD risk score is only valid for ages 3 to 90    Assessment & Plan:  Generalized anxiety disorder with panic attacks -     FLUoxetine HCl; Take 1 capsule (10 mg total) by mouth daily.  Dispense: 30 capsule; Refill: 1 -      hydrOXYzine  HCl; Take 1 tablet (10 mg total) by mouth every 6 (six) hours as needed for anxiety (sleep).  Dispense: 90 tablet;  Refill: 3 -     Ambulatory referral to Psychiatry  Vision changes -     Ambulatory referral to Ophthalmology  Paroxysmal tachycardia (HCC)     Assessment and Plan Assessment & Plan Anxiety Disorder Significant anxiety with panic, social anxiety, and health anxiety.  Recommended cognitive behavioral therapy for anxiety and depression. - Prescribe Prozac 10 mg once daily. - Refer to cognitive behavioral therapy. F/u 4 weeks virtually for mood   Tachycardia Elevated heart rate chronic Today = 105  Propranolol  10mg  TID used for heart rate control. -Instructed pt can take three times a day for HR control  Eye Strain Daily eye strain with pressure, squinting, and occasional blurred vision.  Possible causes include eye strain or dry eyes. - EOMs intact, PERRLA, visual fields and peripheral vision intact during visit - Refer to ophthalmology for further assessment. - Recommend using lubrication eye drops. . Follow-up Assess effectiveness of prozac and ongoing anxiety. - Schedule follow-up in 4 weeks, virtual visit preferred.   Return in about 4 weeks (around 11/22/2023) for mood check - virtual .   I, Curtis DELENA Boom, FNP, have reviewed all documentation for this visit. The documentation on 10/25/23 for the exam, diagnosis, procedures, and orders are all accurate and complete.   Curtis DELENA Boom, FNP

## 2023-10-26 ENCOUNTER — Other Ambulatory Visit: Payer: Self-pay

## 2023-11-22 ENCOUNTER — Ambulatory Visit: Admitting: Family Medicine

## 2023-12-20 ENCOUNTER — Other Ambulatory Visit: Payer: Self-pay

## 2023-12-20 ENCOUNTER — Telehealth (INDEPENDENT_AMBULATORY_CARE_PROVIDER_SITE_OTHER): Admitting: Family Medicine

## 2023-12-20 DIAGNOSIS — R000 Tachycardia, unspecified: Secondary | ICD-10-CM

## 2023-12-20 DIAGNOSIS — F411 Generalized anxiety disorder: Secondary | ICD-10-CM | POA: Diagnosis not present

## 2023-12-20 DIAGNOSIS — F410 Panic disorder [episodic paroxysmal anxiety] without agoraphobia: Secondary | ICD-10-CM | POA: Diagnosis not present

## 2023-12-20 MED ORDER — FLUOXETINE HCL 10 MG PO CAPS
10.0000 mg | ORAL_CAPSULE | Freq: Every day | ORAL | 1 refills | Status: DC
  Filled 2023-12-20: qty 90, 90d supply, fill #0

## 2023-12-20 NOTE — Progress Notes (Signed)
 Virtual Visit via Video Note  I connected with Elizabeth Berger on 12/20/23 at  2:20 PM EDT by a video enabled telemedicine application and verified that I am speaking with the correct person using two identifiers.  Patient Location: Other:  car Provider Location: Office/Clinic  I discussed the limitations, risks, security, and privacy concerns of performing an evaluation and management service by video and the availability of in person appointments. I also discussed with the patient that there may be a patient responsible charge related to this service. The patient expressed understanding and agreed to proceed.  Subjective: PCP: Wellington Curtis LABOR, FNP  Chief Complaint  Patient presents with   Anxiety   Anxiety      Discussed the use of AI scribe software for clinical note transcription with the patient, who gave verbal consent to proceed.  History of Present Illness Elizabeth Berger is a 33 year old female who presents for medication management of anxiety.She is traveling the car as a passenger to the beach with her child and husband. Endorsed consent to continue virtual visit with spouse and child in car with her.  She is currently taking fluoxetine  10 mg daily, which she finds effective in managing her anxiety. Takes hydroxyzine  at night for panic.   Hx of tachycardia - takes propanolol 10mg  nightly  ROS: Per HPI  Current Outpatient Medications:    FLUoxetine  (PROZAC ) 10 MG capsule, Take 1 capsule (10 mg total) by mouth daily., Disp: 30 capsule, Rfl: 1   hydrOXYzine  (ATARAX ) 10 MG tablet, Take 1 tablet (10 mg total) by mouth every 6 (six) hours as needed for anxiety (sleep)., Disp: 90 tablet, Rfl: 3   propranolol  (INDERAL ) 10 MG tablet, Take 1 tablet (10 mg total) by mouth 3 (three) times daily., Disp: 270 tablet, Rfl: 3  Observations/Objective: There were no vitals filed for this visit. Physical Exam Constitutional:      General: She is not in acute  distress.    Appearance: Normal appearance. She is normal weight. She is not ill-appearing, toxic-appearing or diaphoretic.  Pulmonary:     Effort: Pulmonary effort is normal.  Musculoskeletal:        General: Normal range of motion.     Cervical back: Normal range of motion.  Neurological:     General: No focal deficit present.     Mental Status: She is alert and oriented to person, place, and time. Mental status is at baseline.     GCS: GCS eye subscore is 4. GCS verbal subscore is 5. GCS motor subscore is 6.     Cranial Nerves: No cranial nerve deficit.  Psychiatric:        Attention and Perception: Attention and perception normal.        Mood and Affect: Mood and affect normal.        Speech: Speech normal.        Behavior: Behavior normal. Behavior is cooperative.        Thought Content: Thought content normal.        Cognition and Memory: Cognition and memory normal.        Judgment: Judgment normal.     Assessment and Plan: Generalized anxiety disorder with panic attacks   Assessment and Plan Assessment & Plan Anxiety disorder Anxiety disorder is well-managed with current medication regimen. She is taking fluoxetine  10 mg daily and uses hydroxyzine  at night as needed. - Received scheduling for CBT - but now feels well managed on daily fluoxetine  -  wants to hold on CBT for now - Continue fluoxetine  10 mg daily. - Continue prn hydroxyzine   - Follow up in 6 months for chronic management of mood, or sooner if anxiety worsens  Sinus Tachycardia - Controlled - Continue Propanolol    Follow Up Instructions: Return in about 6 months (around 06/21/2024) for Mood .   I discussed the assessment and treatment plan with the patient. The patient was provided an opportunity to ask questions, and all were answered. The patient agreed with the plan and demonstrated an understanding of the instructions.   The patient was advised to call back or seek an in-person evaluation if the  symptoms worsen or if the condition fails to improve as anticipated.  The above assessment and management plan was discussed with the patient. The patient verbalized understanding of and has agreed to the management plan.   Curtis DELENA Boom, FNP

## 2024-01-18 ENCOUNTER — Encounter: Payer: Self-pay | Admitting: Family Medicine

## 2024-02-04 ENCOUNTER — Other Ambulatory Visit: Payer: Self-pay

## 2024-02-04 ENCOUNTER — Other Ambulatory Visit: Payer: Self-pay | Admitting: Family Medicine

## 2024-02-04 DIAGNOSIS — F410 Panic disorder [episodic paroxysmal anxiety] without agoraphobia: Secondary | ICD-10-CM

## 2024-02-04 MED ORDER — FLUOXETINE HCL 20 MG PO CAPS
20.0000 mg | ORAL_CAPSULE | Freq: Every day | ORAL | 3 refills | Status: AC
  Filled 2024-02-04 – 2024-02-15 (×2): qty 90, 90d supply, fill #0
  Filled 2024-04-28: qty 90, 90d supply, fill #1
  Filled 2024-05-08: qty 90, 90d supply, fill #0

## 2024-02-14 ENCOUNTER — Other Ambulatory Visit: Payer: Self-pay

## 2024-02-15 ENCOUNTER — Other Ambulatory Visit: Payer: Self-pay

## 2024-03-01 ENCOUNTER — Encounter: Payer: Self-pay | Admitting: Family Medicine

## 2024-03-07 ENCOUNTER — Ambulatory Visit: Admitting: Family Medicine

## 2024-03-13 ENCOUNTER — Other Ambulatory Visit: Payer: Self-pay

## 2024-03-13 ENCOUNTER — Ambulatory Visit (INDEPENDENT_AMBULATORY_CARE_PROVIDER_SITE_OTHER): Admitting: Family Medicine

## 2024-03-13 VITALS — BP 97/68 | HR 91 | Resp 16 | Wt 139.7 lb

## 2024-03-13 DIAGNOSIS — F410 Panic disorder [episodic paroxysmal anxiety] without agoraphobia: Secondary | ICD-10-CM | POA: Diagnosis not present

## 2024-03-13 DIAGNOSIS — K5909 Other constipation: Secondary | ICD-10-CM | POA: Diagnosis not present

## 2024-03-13 DIAGNOSIS — F411 Generalized anxiety disorder: Secondary | ICD-10-CM | POA: Diagnosis not present

## 2024-03-13 DIAGNOSIS — K649 Unspecified hemorrhoids: Secondary | ICD-10-CM

## 2024-03-13 MED ORDER — HYDROCORTISONE ACETATE 25 MG RE SUPP
25.0000 mg | Freq: Every day | RECTAL | 3 refills | Status: AC
  Filled 2024-03-13 – 2024-05-08 (×3): qty 12, 12d supply, fill #0

## 2024-03-13 NOTE — Progress Notes (Signed)
 Acute Office Visit  Introduced to nurse practitioner role and practice setting.  All questions answered.  Discussed provider/patient relationship and expectations.   Subjective:     Patient ID: Elizabeth Berger, female    DOB: Jul 14, 1990, 33 y.o.   MRN: [id number]  Chief Complaint  Patient presents with   Constipation    Chronic issue. Patient here to be refer to GI. Possible prolapse.    Discussed the use of AI scribe software for clinical note transcription with the patient, who gave verbal consent to proceed.  History of Present Illness Elizabeth Berger is a 33 year old female with chronic constipation who presents with concerns about a possible prolapse vs hemorrhoids.  She has experienced constipation since birth of her child two years ago, with bowel movements occurring twice a week, described as 'little pebbles.' Despite increasing fiber intake and using Metamucil daily, her symptoms persist. She previously consulted a gastroenterologist who recommended fiber intake, which has not been effective.  She recently tried magnesium citrate, resulting in significant discomfort, but has not used Miralax or other stool softeners regularly. She is concerned about hemorrhoids, feeling them 'pop out' during bowel movements without bleeding or pain. No incontinence or vaginal symptoms are present.  She plans to try for a baby next year and has a history of a prolonged labor of 33 hours with her previous vaginal birth, which she feels may have contributed to her current issues. She has not undergone any pelvic floor therapy previously.  She maintains an active lifestyle, having lost ten pounds since her last visit, and reports no abdominal pain, nausea, or vomiting. Her mood has improved following an increase in her medication, and she continues to take fluoxetine  for maintenance. She has not had any recent diagnostic scopes or imaging studies related to her gastrointestinal  symptoms.  HPI  ROS      Objective:    BP 97/68 (BP Location: Left Arm, Patient Position: Sitting, Cuff Size: Normal)   Pulse 91   Resp 16   Wt 139 lb 11.2 oz (63.4 kg)   SpO2 98%   BMI 25.55 kg/m    Physical Exam Constitutional:      General: She is not in acute distress.    Appearance: Normal appearance. She is not ill-appearing, toxic-appearing or diaphoretic.  HENT:     Head: Normocephalic.     Nose: Nose normal.     Mouth/Throat:     Mouth: Mucous membranes are moist.     Pharynx: Oropharynx is clear.  Eyes:     Extraocular Movements: Extraocular movements intact.     Pupils: Pupils are equal, round, and reactive to light.  Cardiovascular:     Rate and Rhythm: Normal rate and regular rhythm.     Pulses: Normal pulses.     Heart sounds: Normal heart sounds. No murmur heard.    No friction rub. No gallop.  Pulmonary:     Effort: No respiratory distress.     Breath sounds: No stridor. No wheezing, rhonchi or rales.  Chest:     Chest wall: No tenderness.  Abdominal:     General: Abdomen is flat. Bowel sounds are normal. There is no distension.     Palpations: Abdomen is soft. There is no mass.     Tenderness: There is no abdominal tenderness. There is no right CVA tenderness, left CVA tenderness, guarding or rebound.     Hernia: No hernia is present.  Genitourinary:    Comments:  Decline pelvic exam Musculoskeletal:     Right lower leg: No edema.     Left lower leg: No edema.  Skin:    General: Skin is warm and dry.     Capillary Refill: Capillary refill takes less than 2 seconds.  Neurological:     General: No focal deficit present.     Mental Status: She is alert and oriented to person, place, and time. Mental status is at baseline.  Psychiatric:        Mood and Affect: Mood normal.        Behavior: Behavior normal.        Thought Content: Thought content normal.        Judgment: Judgment normal.     No results found for any visits on  03/13/24.      Assessment & Plan:  Assessment and Plan Assessment & Plan Chronic constipation Since birth of her child two years ago (2023), with bowel movements occurring twice a week. Stools are small and pebble-like. Previous dietary fiber increase was ineffective. Possible pelvic floor dysfunction contributing to constipation due to 33 hours of labor and hx of diastasis recti.  - Denies melena, blood in stool, NVD. Able to eat and drink.  - Physical exam unremarkable, no abdominal pain - Bowel sounds are active - some firmness to ascending colon - Most likely slow transit constipation, complicated by pelvic floor changes post partum - Start daily Miralax, one cap per day, goal one stool per day - if diarrhea occurs, decrease to half cap per day or every other day - Consider stool softener like Colace to soften stools. - Increase water intake. - Perform abdominal massages in a clockwise direction. - Referred to pelvic floor therapy. - Referred to GI for further evaluation given chronicity of symptoms  Internal Hemorrhoids Suspected internal hemorrhoids with sensation of protrusion during bowel movements. No bleeding or pain. Possible pelvic floor dysfunction contributing to hemorrhoid formation. - Denies any blood or melena in stool. Stool is small hard pebbles, but brown in color - declines pelvic exam today - does not feel any prolapse vaginally - Use hemorrhoid suppositories daily for one month for treatment  Generalized Anxiety Disorder - chronic, controlled - continue Prozac  20mg  daily     Problem List Items Addressed This Visit       Other   Generalized anxiety disorder with panic attacks   Other Visit Diagnoses       Hemorrhoids, unspecified hemorrhoid type    -  Primary     Chronic constipation       Relevant Orders   Ambulatory referral to Physical Therapy   Ambulatory referral to Gastroenterology       Meds ordered this encounter  Medications    hydrocortisone  (ANUSOL -HC) 25 MG suppository    Sig: Place 1 suppository (25 mg total) rectally daily.    Dispense:  12 suppository    Refill:  3    Return in about 6 months (around 09/10/2024) for mood mgmt.  Curtis DELENA Boom, FNP  I, Curtis DELENA Boom, FNP, have reviewed all documentation for this visit. The documentation on 03/13/24 for the exam, diagnosis, procedures, and orders are all accurate and complete.

## 2024-03-24 ENCOUNTER — Other Ambulatory Visit: Payer: Self-pay

## 2024-04-15 ENCOUNTER — Ambulatory Visit: Admitting: Gastroenterology

## 2024-04-28 ENCOUNTER — Other Ambulatory Visit (HOSPITAL_COMMUNITY): Payer: Self-pay

## 2024-04-28 ENCOUNTER — Other Ambulatory Visit: Payer: Self-pay | Admitting: Student

## 2024-04-28 ENCOUNTER — Other Ambulatory Visit: Payer: Self-pay

## 2024-04-28 DIAGNOSIS — I479 Paroxysmal tachycardia, unspecified: Secondary | ICD-10-CM

## 2024-04-28 MED ORDER — PROPRANOLOL HCL 10 MG PO TABS
10.0000 mg | ORAL_TABLET | Freq: Three times a day (TID) | ORAL | 3 refills | Status: AC
  Filled 2024-04-28: qty 270, 90d supply, fill #0

## 2024-04-29 ENCOUNTER — Encounter: Payer: Self-pay | Admitting: Pharmacist

## 2024-04-29 ENCOUNTER — Other Ambulatory Visit: Payer: Self-pay

## 2024-05-05 ENCOUNTER — Other Ambulatory Visit: Payer: Self-pay

## 2024-05-08 ENCOUNTER — Other Ambulatory Visit (HOSPITAL_COMMUNITY): Payer: Self-pay

## 2024-05-08 ENCOUNTER — Ambulatory Visit (INDEPENDENT_AMBULATORY_CARE_PROVIDER_SITE_OTHER): Admitting: Gastroenterology

## 2024-05-08 ENCOUNTER — Other Ambulatory Visit: Payer: Self-pay

## 2024-05-08 ENCOUNTER — Encounter: Payer: Self-pay | Admitting: Gastroenterology

## 2024-05-08 VITALS — BP 112/80 | HR 90 | Temp 98.1°F | Ht 62.0 in | Wt 146.0 lb

## 2024-05-08 DIAGNOSIS — K5904 Chronic idiopathic constipation: Secondary | ICD-10-CM

## 2024-05-08 DIAGNOSIS — K5909 Other constipation: Secondary | ICD-10-CM

## 2024-05-08 NOTE — Progress Notes (Signed)
 "   Primary Care Physician: Wellington Curtis LABOR, FNP (Inactive)  Primary Gastroenterologist:  Dr. Clotilda Schaffer  Chief Complaint  Patient presents with   Transfer Care    Dr Therisa patient    Constipation    HPI: Elizabeth Berger is a 34 y.o. female here in follow-up of chronic constipation.  Her stools changed in 2023, after the birth of her son.  Prior to that time she was having 1-2 formed bowel movements a day.  Since his birth she has had pebbles, having a bowel movement approximately 3 times per week.  She saw Dr. Therisa in 09/2022, who recommended that she increase dietary fiber and use Metamucil.  She also uses MiraLAX occasionally.  She has not seen benefit.  She recently saw her PCP for the same issue, who referred her for pelvic floor evaluation.  She is not a strainer. There is no blood in the stool or unexplained weight loss.  She and her husband are currently trying to conceive a second child.  Past Medical History:  Diagnosis Date   Anxiety    Depression    FH: migraines    mom   Herpes    1&2 IgG+   Migraines    Renal disorder    kidney stones   Tachycardia    sinus tach, takes propanolol    Current Outpatient Medications  Medication Sig Dispense Refill   FLUoxetine  (PROZAC ) 20 MG capsule Take 1 capsule (20 mg total) by mouth daily. 90 capsule 3   hydrocortisone  (ANUSOL -HC) 25 MG suppository Place 1 suppository (25 mg total) rectally daily. 12 suppository 3   hydrOXYzine  (ATARAX ) 10 MG tablet Take 1 tablet (10 mg total) by mouth every 6 (six) hours as needed for anxiety (sleep). 90 tablet 3   propranolol  (INDERAL ) 10 MG tablet Take 1 tablet (10 mg total) by mouth 3 (three) times daily. 270 tablet 3   No current facility-administered medications for this visit.    Allergies as of 05/08/2024   (No Known Allergies)    ROS:  General: Negative for anorexia, weight loss, fever, chills, fatigue, weakness. ENT: Negative for hoarseness, difficulty swallowing  , nasal congestion. CV: Negative for chest pain, angina, palpitations, dyspnea on exertion, peripheral edema.  Respiratory: Negative for dyspnea at rest, dyspnea on exertion, cough, sputum, wheezing.  GI: See history of present illness. GU:  Negative for dysuria, hematuria, urinary incontinence, urinary frequency, nocturnal urination.  Endo: Negative for unusual weight change.    Physical Examination:   BP 112/80 (BP Location: Right Arm, Patient Position: Sitting, Cuff Size: Normal)   Pulse 90   Temp 98.1 F (36.7 C) (Oral)   Ht 5' 2 (1.575 m)   Wt 146 lb (66.2 kg)   BMI 26.70 kg/m   General: Well-nourished, well-developed in no acute distress.  Eyes: No icterus. Conjunctivae pink. Lungs: Clear to auscultation bilaterally. Non-labored. Heart: Regular rate and rhythm, no murmurs rubs or gallops.  Abdomen: Bowel sounds are normal, nontender, nondistended, no hepatosplenomegaly or masses, no abdominal bruits or hernia , no rebound or guarding.   Extremities: No lower extremity edema. No clubbing or deformities. Neuro: Alert and oriented x 3.  Grossly intact. Skin: Warm and dry, no jaundice.   Psych: Alert and cooperative, normal mood and affect.  Labs:    Imaging Studies: No results found.  Assessment and Plan:   Elizabeth Berger is a 34 y.o. y/o female with constipation refractory to lifestyle and dietary changes. She is currently working  to conceive a second child.  We discussed the pathophysiology of constipation.  I recommended that she continue to focus on fiber, both dietary and using supplements, as this will improve the diversity of her microbiome, which she will then pass onto her newborn.  She can do this by focusing on eating the rainbow, which will lead to intake of a diversity of micronutrients to support a diversity of gut bacteria.  Her goal is for 25 to 30 g of fiber a day.  She can supplement up to 10 g of this with a fiber supplement.  I recommend psyllium  husk capsules, increasing the dose gradually from 3 capsules daily for a week, then to 6 capsules a week, then 9 to 10 capsules every day.  At the same time that she does this fiber, I recommend that she use 1 capful of MiraLAX.  This will pull water into the stool and will fluff the fiber.  Unfortunately, she cannot use a rescue medication like Dulcolax, so if this is not adequate, I recommend increasing to 2 capfuls of MiraLAX, dose at the same time as the fiber.  I also recommended that she increase hydration to 64 ounces daily.  Most of the prescription medications that we use for constipation are contraindicated in pregnancy and breast-feeding.  If symptoms persist despite the above management plan, after she is delivered and is no longer breast-feeding, we can discuss prescription remedies such as Linzess, Trulance, Motegrity, etc.  I think is a great idea that they are evaluating the pelvic floor.  Pelvic floor issues are often a cause of refractory constipation, so we will be good to have an evaluation on the books.  Follow-up as needed.     Clotilda Schaffer, MD    Note: This dictation was prepared with Dragon dictation along with smaller phrase technology. Any transcriptional errors that result from this process are unintentional.   "

## 2024-05-27 ENCOUNTER — Telehealth

## 2024-09-10 ENCOUNTER — Encounter
